# Patient Record
Sex: Male | Born: 1960 | Race: White | Hispanic: No | Marital: Married | Smoking: Former smoker
Health system: Southern US, Community
[De-identification: ages and names within clinical notes are randomized; demographics above are authoritative.]

## PROBLEM LIST (undated history)

## (undated) DIAGNOSIS — E669 Obesity, unspecified: Secondary | ICD-10-CM

## (undated) DIAGNOSIS — E785 Hyperlipidemia, unspecified: Secondary | ICD-10-CM

## (undated) DIAGNOSIS — I10 Essential (primary) hypertension: Secondary | ICD-10-CM

## (undated) DIAGNOSIS — C44319 Basal cell carcinoma of skin of other parts of face: Secondary | ICD-10-CM

## (undated) DIAGNOSIS — T7840XA Allergy, unspecified, initial encounter: Secondary | ICD-10-CM

## (undated) HISTORY — PX: PROSTATE SURGERY: SHX751

## (undated) HISTORY — DX: Allergy, unspecified, initial encounter: T78.40XA

## (undated) HISTORY — DX: Hyperlipidemia, unspecified: E78.5

## (undated) HISTORY — DX: Obesity, unspecified: E66.9

## (undated) HISTORY — DX: Basal cell carcinoma of skin of other parts of face: C44.319

## (undated) HISTORY — DX: Essential (primary) hypertension: I10

---

## 1998-11-23 ENCOUNTER — Encounter: Payer: Self-pay | Admitting: Urology

## 1998-11-23 ENCOUNTER — Ambulatory Visit (HOSPITAL_COMMUNITY): Admission: RE | Admit: 1998-11-23 | Discharge: 1998-11-23 | Payer: Self-pay | Admitting: Urology

## 2011-08-22 ENCOUNTER — Ambulatory Visit (INDEPENDENT_AMBULATORY_CARE_PROVIDER_SITE_OTHER): Payer: 59 | Admitting: Internal Medicine

## 2011-08-22 DIAGNOSIS — E119 Type 2 diabetes mellitus without complications: Secondary | ICD-10-CM | POA: Insufficient documentation

## 2011-08-22 MED ORDER — FLUTICASONE PROPIONATE 50 MCG/ACT NA SUSP: 2.0000 | Freq: Every day | NASAL | Status: DC

## 2011-08-22 MED ORDER — PREDNISONE 10 MG PO TABS: ORAL_TABLET | ORAL | Status: DC

## 2011-08-22 NOTE — Progress Notes (Signed)
  Subjective:    Patient ID: Kevin Barnes, male    DOB: 04/17/1961, 51 y.o.   MRN: [id number]  URI  This is a new problem. The current episode started in the past 7 days. The problem has been gradually worsening. There has been no fever. Associated symptoms include congestion, coughing and sinus pain. Pertinent negatives include no abdominal pain, chest pain, ear pain, headaches, neck pain, sore throat, vomiting or wheezing.      Review of Systems  Constitutional: Negative.   HENT: Positive for congestion and postnasal drip. Negative for hearing loss, ear pain, sore throat, neck pain and ear discharge.   Eyes: Negative.   Respiratory: Positive for cough. Negative for wheezing.   Cardiovascular: Negative.  Negative for chest pain.  Gastrointestinal: Negative.  Negative for vomiting and abdominal pain.  Musculoskeletal: Negative.   Neurological: Negative.  Negative for headaches.       Objective:   Physical Exam  Constitutional: He is oriented to person, place, and time. He appears well-developed and well-nourished.  HENT:  Head: Normocephalic.  Right Ear: External ear normal.  Left Ear: External ear normal.  Mouth/Throat: Oropharynx is clear and moist.  Eyes: Pupils are equal, round, and reactive to light.  Cardiovascular: Normal rate.   Pulmonary/Chest: Effort normal and breath sounds normal.  Musculoskeletal: Normal range of motion.  Neurological: He is alert and oriented to person, place, and time.  Skin: Skin is warm and dry.          Assessment & Plan:

## 2011-08-22 NOTE — Progress Notes (Deleted)
VCO. Patient lower abdominal area prepped with betadine and alcohol.  2.5 cc 1% lidocaine with epi injected and #11 blade used to open wound.  This 2.5 cm wound was firm with only a small central area of fluctuance and a wound culture was obtained and a small amount of white/red drainage expressed.  1/4 inch packing used and telfa, gauze and hypofix applied.  Pt tolerated well.  Wound instructions given.

## 2011-08-22 NOTE — Patient Instructions (Signed)
Patient is advised to use short course of prednisone and flonase for symptomatic relief, if signs of infection and sinus pain worsen is given script of Cefdinir 300 mg one po BID #20 to take for presumed sinusitis.  He wishes not to have any lab work today and plans to return in February to see Dr. Perrin Maltese for his diabetes and chronic med follow-up.  RTC if symptoms not improved in 2-3 days.

## 2011-08-24 ENCOUNTER — Other Ambulatory Visit: Payer: Self-pay | Admitting: Internal Medicine

## 2011-08-27 ENCOUNTER — Ambulatory Visit: Payer: Self-pay | Admitting: Internal Medicine

## 2011-09-10 ENCOUNTER — Telehealth: Payer: Self-pay

## 2011-09-10 NOTE — Telephone Encounter (Signed)
Patient has appointment w/ Dr. Perrin Maltese for 10/15/11 but his RX will run out before then. Requests we call in RX soon so he doesn't run out.

## 2011-09-11 NOTE — Telephone Encounter (Signed)
PT DOES NOT NEED RX'S NOW BUT WILL HAVE HIS PHARMACY SEND A REQUEST WHEN HE NEEDS IT.

## 2011-09-11 NOTE — Telephone Encounter (Signed)
Called pt LMOM for pt to call back and verify which rx he needs

## 2011-09-26 ENCOUNTER — Other Ambulatory Visit: Payer: Self-pay | Admitting: Physician Assistant

## 2011-09-28 ENCOUNTER — Other Ambulatory Visit: Payer: Self-pay | Admitting: Physician Assistant

## 2011-10-09 ENCOUNTER — Ambulatory Visit (INDEPENDENT_AMBULATORY_CARE_PROVIDER_SITE_OTHER): Payer: 59 | Admitting: Internal Medicine

## 2011-10-09 VITALS — BP 122/84 | HR 76 | Temp 97.7°F | Resp 16 | Ht 70.75 in | Wt 229.8 lb

## 2011-10-09 DIAGNOSIS — E7889 Other lipoprotein metabolism disorders: Secondary | ICD-10-CM

## 2011-10-09 DIAGNOSIS — Z7189 Other specified counseling: Secondary | ICD-10-CM

## 2011-10-09 DIAGNOSIS — E119 Type 2 diabetes mellitus without complications: Secondary | ICD-10-CM

## 2011-10-09 DIAGNOSIS — E789 Disorder of lipoprotein metabolism, unspecified: Secondary | ICD-10-CM

## 2011-10-09 DIAGNOSIS — I10 Essential (primary) hypertension: Secondary | ICD-10-CM

## 2011-10-09 DIAGNOSIS — Z79899 Other long term (current) drug therapy: Secondary | ICD-10-CM

## 2011-10-09 DIAGNOSIS — E1169 Type 2 diabetes mellitus with other specified complication: Secondary | ICD-10-CM | POA: Insufficient documentation

## 2011-10-09 LAB — COMPREHENSIVE METABOLIC PANEL
Alkaline Phosphatase: 85 U/L (ref 39–117)
BUN: 12 mg/dL (ref 6–23)
Creat: 0.92 mg/dL (ref 0.50–1.35)
Glucose, Bld: 142 mg/dL — ABNORMAL HIGH (ref 70–99)
Sodium: 137 mEq/L (ref 135–145)
Total Bilirubin: 0.6 mg/dL (ref 0.3–1.2)

## 2011-10-09 LAB — LIPID PANEL
Cholesterol: 166 mg/dL (ref 0–200)
HDL: 44 mg/dL (ref >39)
LDL Cholesterol: 67 mg/dL (ref 0–99)
Total CHOL/HDL Ratio: 3.8 Ratio
Triglycerides: 277 mg/dL — ABNORMAL HIGH (ref <150)
VLDL: 55 mg/dL — ABNORMAL HIGH (ref 0–40)

## 2011-10-09 MED ORDER — METFORMIN HCL 500 MG PO TABS: 500.0000 mg | ORAL_TABLET | Freq: Two times a day (BID) | ORAL | Status: DC

## 2011-10-09 MED ORDER — SIMVASTATIN 20 MG PO TABS: 20.0000 mg | ORAL_TABLET | Freq: Every evening | ORAL | Status: DC

## 2011-10-09 MED ORDER — LISINOPRIL-HYDROCHLOROTHIAZIDE 20-12.5 MG PO TABS: 1.0000 | ORAL_TABLET | Freq: Every day | ORAL | Status: DC

## 2011-10-09 NOTE — Progress Notes (Signed)
  Subjective:    Patient ID: Kevin Barnes, male    DOB: 1961-01-30, 51 y.o.   MRN: [id number]  HPI Niddm, Htn, Lipids all stable and no problems with meds.   Review of Systems Stable    Objective:   Physical Exam Lungs clear Heart Normal        Assessment & Plan:  Refill meds  Done 1 yr CPE 6 mos

## 2011-10-15 ENCOUNTER — Encounter: Payer: 59 | Admitting: Internal Medicine

## 2012-08-07 ENCOUNTER — Ambulatory Visit (INDEPENDENT_AMBULATORY_CARE_PROVIDER_SITE_OTHER): Payer: 59 | Admitting: Family Medicine

## 2012-08-07 VITALS — BP 120/90 | HR 81 | Temp 98.0°F | Resp 18 | Wt 239.0 lb

## 2012-08-07 DIAGNOSIS — G43909 Migraine, unspecified, not intractable, without status migrainosus: Secondary | ICD-10-CM

## 2012-08-07 DIAGNOSIS — R110 Nausea: Secondary | ICD-10-CM

## 2012-08-07 MED ORDER — CYCLOBENZAPRINE HCL 10 MG PO TABS: 10.0000 mg | ORAL_TABLET | Freq: Two times a day (BID) | ORAL | Status: DC | PRN

## 2012-08-07 MED ORDER — KETOROLAC TROMETHAMINE 60 MG/2ML IM SOLN
60.0000 mg | Freq: Once | INTRAMUSCULAR | Status: AC
  Administered 2012-08-07: 60 mg via INTRAMUSCULAR

## 2012-08-07 MED ORDER — BUTALBITAL-APAP-CAFFEINE 50-325-40 MG PO TABS: 1.0000 | ORAL_TABLET | Freq: Four times a day (QID) | ORAL | Status: DC | PRN

## 2012-08-07 MED ORDER — PROMETHAZINE HCL 25 MG/ML IJ SOLN
25.0000 mg | Freq: Four times a day (QID) | INTRAMUSCULAR | Status: DC | PRN
  Administered 2012-08-07: 25 mg via INTRAMUSCULAR

## 2012-08-07 NOTE — Progress Notes (Signed)
Subjective:    Patient ID: Kevin Barnes, male    DOB: [birth year], 52 y.o.   MRN: [id number] Chief Complaint  Patient presents with  . Headache    HPI  Kevin Barnes is a pleasant 52 yo man who has been under a lot of stress recently.  His son has mental illness and had been off of his medications so he feels certain that this is a tension HA - he can feel all the tenseness in his neck and that he is clenching his jaw and squeezing his eyes.  HA started 2d ago- started in back of neck, yesterday was worse and now pressure behind eyes through all head.  Took 1500mg  acetaminophen (over 2 hrs) yest then layed down and improved a little and focused mainly in back of neck.  Took excedrin migraine last night so didn't sleep well. This a.m. has progressively increased but knew he had reached his max dose on tylenol so came in.  Rarely gets HAs this bad - has had about 4 migraines through life.  Worse w/ light and sound. Was a little nauseas yesterday.  Drives himself here.  Nothing feels unusual about this HA - no dizziness, vision change, confusion, etc.  No falls or hits to the head.    Past Medical History  Diagnosis Date  . Obesity   . Diabetes mellitus   . Obesity   . Hyperlipidemia   . Hypertension    Current Outpatient Prescriptions on File Prior to Visit  Medication Sig Dispense Refill  . B Complex-C (B-COMPLEX WITH VITAMIN C) tablet Take 1 tablet by mouth daily.      . Cholecalciferol (VITAMIN D) 2000 UNITS CAPS Take 1 capsule by mouth daily.      . Flaxseed, Linseed, (FLAX SEED OIL) 1000 MG CAPS Take by mouth.      . fluticasone (FLONASE) 50 MCG/ACT nasal spray Place 2 sprays into the nose daily.  16 g  3  . lisinopril-hydrochlorothiazide (PRINZIDE,ZESTORETIC) 20-12.5 MG per tablet Take 1 tablet by mouth daily.  90 tablet  3  . metFORMIN (GLUCOPHAGE) 500 MG tablet Take 1 tablet (500 mg total) by mouth 2 (two) times daily with a meal.  180 tablet  3  . simvastatin (ZOCOR) 20 MG tablet Take  1 tablet (20 mg total) by mouth every evening.  90 tablet  3  . vitamin B-12 (CYANOCOBALAMIN) 100 MCG tablet Take 50 mcg by mouth daily.       No current facility-administered medications on file prior to visit.   No Known Allergies   Review of Systems  Constitutional: Positive for activity change, appetite change and fatigue. Negative for fever, chills and diaphoresis.  HENT: Positive for neck pain and neck stiffness. Negative for hearing loss, ear pain, congestion, rhinorrhea, trouble swallowing, sinus pressure, tinnitus and ear discharge.   Eyes: Positive for photophobia and pain. Negative for discharge, redness and visual disturbance.  Gastrointestinal: Positive for nausea. Negative for vomiting, abdominal pain and diarrhea.  Musculoskeletal: Positive for myalgias.  Skin: Negative for rash.  Neurological: Positive for headaches. Negative for dizziness, tremors, seizures, syncope, facial asymmetry, speech difficulty, weakness, light-headedness and numbness.  Hematological: Negative for adenopathy.  Psychiatric/Behavioral: Positive for sleep disturbance.      BP 120/90  Pulse 81  Temp 98 F (36.7 C) (Oral)  Resp 18  Wt 239 lb (108.41 kg) Objective:   Physical Exam  Constitutional: He is oriented to person, place, and time. He appears well-developed and well-nourished. No  distress.  HENT:  Head: Normocephalic and atraumatic.  Right Ear: Tympanic membrane, external ear and ear canal normal.  Left Ear: Tympanic membrane, external ear and ear canal normal.  Nose: Nose normal.  Mouth/Throat: Uvula is midline, oropharynx is clear and moist and mucous membranes are normal. No oropharyngeal exudate.  Eyes: Conjunctivae normal and EOM are normal. Pupils are equal, round, and reactive to light. Right eye exhibits no discharge. Left eye exhibits no discharge. No scleral icterus.  Neck: Normal range of motion. Neck supple. No thyromegaly present.  Cardiovascular: Normal rate, regular  rhythm and normal heart sounds.   Pulmonary/Chest: Effort normal and breath sounds normal. No respiratory distress.  Lymphadenopathy:    He has no cervical adenopathy.  Neurological: He is alert and oriented to person, place, and time. No cranial nerve deficit or sensory deficit. He exhibits normal muscle tone. Coordination and gait normal.  Skin: Skin is warm and dry. He is not diaphoretic.  Psychiatric: He has a normal mood and affect. His behavior is normal.      Assessment & Plan:  Tension headache - phenergan 25mg  IM x 1 and toradol 60mg  IM x 1 now.  Few rx for flexeril and fioricet sent to pharmacy.

## 2012-08-07 NOTE — Patient Instructions (Addendum)
Tension Headache  A tension headache is a feeling of pain, pressure, or aching often felt over the front and sides of the head. The pain can be dull or can feel tight (constricting). It is the most common type of headache. Tension headaches are not normally associated with nausea or vomiting and do not get worse with physical activity. Tension headaches can last 30 minutes to several days.   CAUSES   The exact cause is not known, but it may be caused by chemicals and hormones in the brain that lead to pain. Tension headaches often begin after stress, anxiety, or depression. Other triggers may include:   Alcohol.   Caffeine (too much or withdrawal).   Respiratory infections (colds, flu, sinus infections).   Dental problems or teeth clenching.   Fatigue.   Holding your head and neck in one position too long while using a computer.  SYMPTOMS    Pressure around the head.    Dull, aching head pain.    Pain felt over the front and sides of the head.    Tenderness in the muscles of the head, neck, and shoulders.  DIAGNOSIS   A tension headache is often diagnosed based on:    Symptoms.    Physical examination.    A CT scan or MRI of your head. These tests may be ordered if symptoms are severe or unusual.  TREATMENT   Medicines may be given to help relieve symptoms.   HOME CARE INSTRUCTIONS    Only take over-the-counter or prescription medicines for pain or discomfort as directed by your caregiver.    Lie down in a dark, quiet room when you have a headache.    Keep a journal to find out what may be triggering your headaches. For example, write down:   What you eat and drink.   How much sleep you get.   Any change to your diet or medicines.   Try massage or other relaxation techniques.    Ice packs or heat applied to the head and neck can be used. Use these 3 to 4 times per day for 15 to 20 minutes each time, or as needed.    Limit stress.    Sit up straight, and do not tense your muscles.     Quit smoking if you smoke.   Limit alcohol use.   Decrease the amount of caffeine you drink, or stop drinking caffeine.   Eat and exercise regularly.   Get 7 to 9 hours of sleep, or as recommended by your caregiver.   Avoid excessive use of pain medicine as recurrent headaches can occur.   SEEK MEDICAL CARE IF:    You have problems with the medicines you were prescribed.   Your medicines do not work.   You have a change from the usual headache.   You have nausea or vomiting.  SEEK IMMEDIATE MEDICAL CARE IF:    Your headache becomes severe.   You have a fever.   You have a stiff neck.   You have loss of vision.   You have muscular weakness or loss of muscle control.   You lose your balance or have trouble walking.   You feel faint or pass out.   You have severe symptoms that are different from your first symptoms.  MAKE SURE YOU:    Understand these instructions.   Will watch your condition.   Will get help right away if you are not doing well or get worse.  

## 2012-09-08 ENCOUNTER — Other Ambulatory Visit: Payer: Self-pay | Admitting: Family Medicine

## 2012-09-09 NOTE — Telephone Encounter (Signed)
I approved Kevin Barnes's refill on Fioricet - disp 12, no refills. Please call in.

## 2012-09-09 NOTE — Telephone Encounter (Signed)
Forward to Dr. Clelia Croft

## 2012-10-14 ENCOUNTER — Other Ambulatory Visit: Payer: Self-pay

## 2012-10-14 ENCOUNTER — Other Ambulatory Visit: Payer: Self-pay | Admitting: Internal Medicine

## 2012-10-14 MED ORDER — METFORMIN HCL 500 MG PO TABS: 500.0000 mg | ORAL_TABLET | Freq: Two times a day (BID) | ORAL | Status: DC

## 2012-11-03 ENCOUNTER — Ambulatory Visit (INDEPENDENT_AMBULATORY_CARE_PROVIDER_SITE_OTHER): Payer: 59 | Admitting: Family Medicine

## 2012-11-03 VITALS — BP 128/84 | HR 76 | Temp 98.0°F | Resp 16 | Ht 70.5 in | Wt 229.0 lb

## 2012-11-03 DIAGNOSIS — I10 Essential (primary) hypertension: Secondary | ICD-10-CM

## 2012-11-03 DIAGNOSIS — E119 Type 2 diabetes mellitus without complications: Secondary | ICD-10-CM

## 2012-11-03 DIAGNOSIS — Z0000 Encounter for general adult medical examination without abnormal findings: Secondary | ICD-10-CM

## 2012-11-03 DIAGNOSIS — Z7189 Other specified counseling: Secondary | ICD-10-CM

## 2012-11-03 DIAGNOSIS — E789 Disorder of lipoprotein metabolism, unspecified: Secondary | ICD-10-CM

## 2012-11-03 DIAGNOSIS — R51 Headache: Secondary | ICD-10-CM

## 2012-11-03 LAB — COMPREHENSIVE METABOLIC PANEL
ALT: 57 U/L — ABNORMAL HIGH (ref 0–53)
AST: 35 U/L (ref 0–37)
Albumin: 4.3 g/dL (ref 3.5–5.2)
Alkaline Phosphatase: 67 U/L (ref 39–117)
BUN: 11 mg/dL (ref 6–23)
CO2: 29 mEq/L (ref 19–32)
Calcium: 9.7 mg/dL (ref 8.4–10.5)
Chloride: 96 mEq/L (ref 96–112)
Creat: 0.89 mg/dL (ref 0.50–1.35)
Glucose, Bld: 127 mg/dL — ABNORMAL HIGH (ref 70–99)
Potassium: 4.3 mEq/L (ref 3.5–5.3)
Sodium: 138 mEq/L (ref 135–145)
Total Bilirubin: 0.6 mg/dL (ref 0.3–1.2)
Total Protein: 7.5 g/dL (ref 6.0–8.3)

## 2012-11-03 LAB — LIPID PANEL
Cholesterol: 154 mg/dL (ref 0–200)
HDL: 46 mg/dL (ref >39)
LDL Cholesterol: 48 mg/dL (ref 0–99)
Total CHOL/HDL Ratio: 3.3 Ratio
Triglycerides: 302 mg/dL — ABNORMAL HIGH (ref <150)
VLDL: 60 mg/dL — ABNORMAL HIGH (ref 0–40)

## 2012-11-03 LAB — PSA: PSA: 2.95 ng/mL (ref <=4.00)

## 2012-11-03 LAB — POCT GLYCOSYLATED HEMOGLOBIN (HGB A1C): Hemoglobin A1C: 6.5

## 2012-11-03 MED ORDER — SIMVASTATIN 20 MG PO TABS: 20.0000 mg | ORAL_TABLET | Freq: Every evening | ORAL | Status: DC

## 2012-11-03 MED ORDER — METFORMIN HCL 500 MG PO TABS: 500.0000 mg | ORAL_TABLET | Freq: Two times a day (BID) | ORAL | Status: DC

## 2012-11-03 MED ORDER — LISINOPRIL-HYDROCHLOROTHIAZIDE 20-12.5 MG PO TABS
1.0000 | ORAL_TABLET | Freq: Every day | ORAL | Status: DC
Start: 2012-11-03 — End: 2012-12-12

## 2012-11-03 MED ORDER — BUTALBITAL-APAP-CAFFEINE 50-325-40 MG PO TABS: 1.0000 | ORAL_TABLET | Freq: Every day | ORAL | Status: DC | PRN

## 2012-11-03 NOTE — Progress Notes (Signed)
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  He hasn't been able to get in with Dr. Perrin Maltese.  All meds are about to run out.  Currently patient is in his usual state of health. He says his blood sugars when checked randomly have been under control. He is stressed with his 68 year old stepson who still trying to find his way and his move back into the house. Patient has no new complaints.  Objective: Patient is in no acute distress, is overweight, good eye contact and appropriate HEENT is unremarkable Neck: Supple without adenopathy or thyromegaly Chest: Clear Heart: Regular, no murmur, gallop, or right Abdomen: Soft nontender without HSM or masses  Assessment: Patient due for physical and will schedule with Dr. Perrin Maltese. Labs and medications were reviewed and updated  HTN (hypertension) - Plan: lisinopril-hydrochlorothiazide (PRINZIDE,ZESTORETIC) 20-12.5 MG per tablet  Lipid disorder - Plan: simvastatin (ZOCOR) 20 MG tablet, lisinopril-hydrochlorothiazide (PRINZIDE,ZESTORETIC) 20-12.5 MG per tablet  Encounter for medication review and counseling - Plan: simvastatin (ZOCOR) 20 MG tablet  Diabetes mellitus - Plan: simvastatin (ZOCOR) 20 MG tablet, metFORMIN (GLUCOPHAGE) 500 MG tablet, POCT glycosylated hemoglobin (Hb A1C), Comprehensive metabolic panel, Lipid panel, Microalbumin, urine  Headache - Plan: butalbital-acetaminophen-caffeine (FIORICET, ESGIC) 50-325-40 MG per tablet  Annual physical exam - Plan: PSA, Ambulatory referral to Gastroenterology  Results for orders placed in visit on 11/03/12  POCT GLYCOSYLATED HEMOGLOBIN (HGB A1C)      Result Value Range   Hemoglobin A1C 6.5

## 2012-11-04 LAB — MICROALBUMIN, URINE: Microalb, Ur: 1.56 mg/dL (ref 0.00–1.89)

## 2012-12-12 ENCOUNTER — Other Ambulatory Visit: Payer: Self-pay | Admitting: Family Medicine

## 2013-01-19 ENCOUNTER — Other Ambulatory Visit: Payer: Self-pay | Admitting: Internal Medicine

## 2013-02-15 ENCOUNTER — Other Ambulatory Visit: Payer: Self-pay | Admitting: Physician Assistant

## 2013-05-17 ENCOUNTER — Other Ambulatory Visit: Payer: Self-pay | Admitting: Family Medicine

## 2013-05-29 ENCOUNTER — Other Ambulatory Visit: Payer: Self-pay | Admitting: Family Medicine

## 2013-06-17 ENCOUNTER — Other Ambulatory Visit: Payer: Self-pay | Admitting: Physician Assistant

## 2013-06-30 ENCOUNTER — Telehealth: Payer: Self-pay

## 2013-06-30 NOTE — Telephone Encounter (Signed)
Pt was seen in April and thought he rec'd refills for one yr. He needs refills on metformin 500 mg and lisinop/hctz. Pharmacy said that he needed an office visit. He made an appt for his cpe next month.   [facility] on [address]

## 2013-07-01 MED ORDER — LISINOPRIL-HYDROCHLOROTHIAZIDE 20-12.5 MG PO TABS: 1.0000 | ORAL_TABLET | Freq: Every day | ORAL | Status: DC

## 2013-07-01 MED ORDER — METFORMIN HCL 500 MG PO TABS: 500.0000 mg | ORAL_TABLET | Freq: Two times a day (BID) | ORAL | Status: DC

## 2013-07-27 ENCOUNTER — Encounter: Payer: Self-pay | Admitting: Physician Assistant

## 2013-07-27 ENCOUNTER — Ambulatory Visit (INDEPENDENT_AMBULATORY_CARE_PROVIDER_SITE_OTHER): Payer: 59 | Admitting: Physician Assistant

## 2013-07-27 VITALS — BP 132/90 | HR 73 | Temp 97.9°F | Resp 16 | Ht 70.5 in | Wt 233.4 lb

## 2013-07-27 DIAGNOSIS — E1059 Type 1 diabetes mellitus with other circulatory complications: Secondary | ICD-10-CM

## 2013-07-27 DIAGNOSIS — I10 Essential (primary) hypertension: Secondary | ICD-10-CM

## 2013-07-27 DIAGNOSIS — R51 Headache: Secondary | ICD-10-CM

## 2013-07-27 DIAGNOSIS — R319 Hematuria, unspecified: Secondary | ICD-10-CM

## 2013-07-27 DIAGNOSIS — Z0000 Encounter for general adult medical examination without abnormal findings: Secondary | ICD-10-CM

## 2013-07-27 DIAGNOSIS — E119 Type 2 diabetes mellitus without complications: Secondary | ICD-10-CM

## 2013-07-27 DIAGNOSIS — E785 Hyperlipidemia, unspecified: Secondary | ICD-10-CM

## 2013-07-27 LAB — COMPREHENSIVE METABOLIC PANEL
ALBUMIN: 4.4 g/dL (ref 3.5–5.2)
ALT: 50 U/L (ref 0–53)
AST: 33 U/L (ref 0–37)
Alkaline Phosphatase: 71 U/L (ref 39–117)
BUN: 10 mg/dL (ref 6–23)
CO2: 30 meq/L (ref 19–32)
Calcium: 9.5 mg/dL (ref 8.4–10.5)
Chloride: 97 mEq/L (ref 96–112)
Creat: 0.87 mg/dL (ref 0.50–1.35)
GLUCOSE: 115 mg/dL — AB (ref 70–99)
POTASSIUM: 3.8 meq/L (ref 3.5–5.3)
SODIUM: 135 meq/L (ref 135–145)
TOTAL PROTEIN: 7.5 g/dL (ref 6.0–8.3)
Total Bilirubin: 0.7 mg/dL (ref 0.3–1.2)

## 2013-07-27 LAB — CBC
HCT: 43.1 % (ref 39.0–52.0)
HEMOGLOBIN: 15.3 g/dL (ref 13.0–17.0)
MCH: 31.8 pg (ref 26.0–34.0)
MCHC: 35.5 g/dL (ref 30.0–36.0)
MCV: 89.6 fL (ref 78.0–100.0)
Platelets: 325 10*3/uL (ref 150–400)
RBC: 4.81 MIL/uL (ref 4.22–5.81)
RDW: 12.7 % (ref 11.5–15.5)
WBC: 9.5 10*3/uL (ref 4.0–10.5)

## 2013-07-27 LAB — IFOBT (OCCULT BLOOD): IMMUNOLOGICAL FECAL OCCULT BLOOD TEST: POSITIVE

## 2013-07-27 LAB — POCT UA - MICROSCOPIC ONLY
Casts, Ur, LPF, POC: NEGATIVE
Crystals, Ur, HPF, POC: NEGATIVE
MUCUS UA: NEGATIVE
YEAST UA: NEGATIVE

## 2013-07-27 LAB — GLUCOSE, POCT (MANUAL RESULT ENTRY): POC Glucose: 113 mg/dl — AB (ref 70–99)

## 2013-07-27 LAB — POCT URINALYSIS DIPSTICK
BILIRUBIN UA: NEGATIVE
GLUCOSE UA: NEGATIVE
Ketones, UA: NEGATIVE
LEUKOCYTES UA: NEGATIVE
NITRITE UA: NEGATIVE
Protein, UA: NEGATIVE
Spec Grav, UA: <=1.005
Urobilinogen, UA: 0.2
pH, UA: 5.5

## 2013-07-27 LAB — POCT GLYCOSYLATED HEMOGLOBIN (HGB A1C): Hemoglobin A1C: 7.2

## 2013-07-27 LAB — LIPID PANEL
CHOLESTEROL: 154 mg/dL (ref 0–200)
HDL: 52 mg/dL (ref >39)
LDL Cholesterol: 52 mg/dL (ref 0–99)
TRIGLYCERIDES: 251 mg/dL — AB (ref <150)
Total CHOL/HDL Ratio: 3 Ratio
VLDL: 50 mg/dL — AB (ref 0–40)

## 2013-07-27 MED ORDER — BUTALBITAL-APAP-CAFFEINE 50-325-40 MG PO TABS: 1.0000 | ORAL_TABLET | Freq: Every day | ORAL | Status: DC | PRN

## 2013-07-27 MED ORDER — METFORMIN HCL 1000 MG PO TABS: 1000.0000 mg | ORAL_TABLET | Freq: Two times a day (BID) | ORAL | Status: DC

## 2013-07-27 MED ORDER — GLIPIZIDE 5 MG PO TABS: 5.0000 mg | ORAL_TABLET | Freq: Two times a day (BID) | ORAL | Status: DC

## 2013-07-27 MED ORDER — LISINOPRIL-HYDROCHLOROTHIAZIDE 20-25 MG PO TABS: 1.0000 | ORAL_TABLET | Freq: Every day | ORAL | Status: DC

## 2013-07-27 NOTE — Progress Notes (Signed)
   Subjective:    Patient ID: Kevin Barnes, male    DOB: [birth year], 53 y.o.   MRN: [id number]  HPI    Review of Systems  Constitutional: Negative.   HENT: Negative.   Eyes: Negative.   Respiratory: Negative.   Cardiovascular: Negative.   Gastrointestinal: Negative.   Endocrine: Negative.   Genitourinary: Negative.   Musculoskeletal: Negative.   Skin: Negative.   Allergic/Immunologic: Negative.   Neurological: Negative.   Hematological: Negative.   Psychiatric/Behavioral: Negative.        Objective:   Physical Exam        Assessment & Plan:

## 2013-07-27 NOTE — Progress Notes (Signed)
Patient ID: Kevin Barnes MRN: [id number], DOB: [birth year] 53 y.o. Date of Encounter: 07/27/2013, 3:47 PM  Primary Physician: Kennon Portela, MD  Chief Complaint: Physical (CPE)  HPI: 53 y.o. male with history noted below here for CPE. Doing well. Last physical was 11/03/12.   1) Diabetes mellitus: Currently on metformin 500 mg bid and tolerating without issues. Eats what he wants to, just uses portion control. Plans to start exercising again. Last A1C was 6.5% on 11/03/12.   2) Hypertension: Currently on lisinopril/HCTZ 20/12.5 mg and tolerating without any issues. Generally eats what he wants, just uses portion control. Not currently exercising, but plans to get back into this. No chest pain, headaches, vision changes, or focal deficits.   3) Hyperlipidemia: Has always been told be could eat whatever he wanted, just use portion control. However, with all of the stresses of the holidays and his family being in the hospital he has been eating more fast food lately. Currently on simvastain 20 mg qhs and tolerating without any issues. He was exercising regularly and plans to restart this.   4) Migraines: Has been under a lot of stress lately with 2 family members in 2 different hospitals. Has had 3 migraines within the past month. Usually does not have that many. He requests a refill of his Fiorcet. This works well for him.   5) CPE: Up to date with colonoscopy. No polyps found. Next due in 10 years.    Review of Systems: Consitutional: No fever, chills, fatigue, night sweats, lymphadenopathy, or weight changes. Eyes: No visual changes, eye redness, or discharge. ENT/Mouth: Ears: No otalgia, tinnitus, hearing loss, discharge. Nose: No congestion, rhinorrhea, sinus pain, or epistaxis. Throat: No sore throat, post nasal drip, or teeth pain. Cardiovascular: No CP, palpitations, diaphoresis, DOE, edema, orthopnea, PND. Respiratory: No cough, hemoptysis, SOB, or wheezing. Gastrointestinal:  No anorexia, dysphagia, reflux, pain, nausea, vomiting, hematemesis, diarrhea, constipation, BRBPR, or melena. Genitourinary: No dysuria, frequency, urgency, hematuria, incontinence, nocturia, decreased urinary stream, discharge, impotence, or testicular pain/masses. Musculoskeletal: No decreased ROM, myalgias, stiffness, joint swelling, or weakness. Skin: No rash, erythema, lesion changes, pain, warmth, jaundice, or pruritis. Neurological: No headache, dizziness, syncope, seizures, tremors, memory loss, coordination problems, or paresthesias. Psychological: No anxiety, depression, hallucinations, SI/HI. Endocrine: No fatigue, polydipsia, polyphagia, polyuria, or known diabetes.   Past Medical History  Diagnosis Date  . Obesity   . Diabetes mellitus   . Obesity   . Hyperlipidemia   . Hypertension   . Allergy      Past Surgical History  Procedure Laterality Date  . Prostate surgery      Home Meds:  Prior to Admission medications   Medication Sig Start Date End Date Taking? Authorizing Provider  B Complex-C (B-COMPLEX WITH VITAMIN C) tablet Take 1 tablet by mouth daily.    Historical Provider, MD  butalbital-acetaminophen-caffeine (FIORICET, ESGIC) [phone] MG per tablet Take 1 tablet by mouth daily as needed for headache. 11/03/12   Robyn Haber, MD  Cholecalciferol (VITAMIN D) 2000 UNITS CAPS Take 1 capsule by mouth daily.    Historical Provider, MD  cyclobenzaprine (FLEXERIL) 10 MG tablet Take 1 tablet (10 mg total) by mouth 2 (two) times daily as needed for muscle spasms. 08/07/12   Shawnee Knapp, MD  Flaxseed, Linseed, (FLAX SEED OIL) 1000 MG CAPS Take by mouth.    Historical Provider, MD  fluticasone (FLONASE) 50 MCG/ACT nasal spray Place 2 sprays into the nose daily. 08/22/11 08/21/12  Kemper Durie, PA-C  lisinopril-hydrochlorothiazide (PRINZIDE,ZESTORETIC) 20-12.5 MG per tablet Take 1 tablet by mouth daily. PATIENT NEEDS OFFICE VISIT FOR ADDITIONAL REFILLS - 3rd NOTICE 07/01/13    Chelle S Jeffery, PA-C  metFORMIN (GLUCOPHAGE) 500 MG tablet Take 1 tablet (500 mg total) by mouth 2 (two) times daily with a meal. PATIENT NEEDS OFFICE VISIT FOR ADDITIONAL REFILLS 07/01/13   Chelle S Jeffery, PA-C  simvastatin (ZOCOR) 20 MG tablet Take 1 tablet (20 mg total) by mouth every evening. 11/03/12   Robyn Haber, MD  VIAGRA 50 MG tablet TAKE ONE TABLET BY MOUTH EVERY DAY AS NEEDED 01/19/13   Theda Sers, PA-C  vitamin B-12 (CYANOCOBALAMIN) 100 MCG tablet Take 50 mcg by mouth daily.    Historical Provider, MD    Allergies: No Known Allergies  History   Social History  . Marital Status: Married    Spouse Name: N/A    Number of Children: N/A  . Years of Education: 12   Occupational History  . dispatcher [employer]   Social History Main Topics  . Smoking status: Former Research scientist (life sciences)  . Smokeless tobacco: Not on file  . Alcohol Use: 0.5 oz/week    1 drink(s) per week     Comment: 1 drink  . Drug Use: No  . Sexual Activity: Yes    Birth Control/ Protection: None   Other Topics Concern  . Not on file   Social History Narrative  . No narrative on file    Family History  Problem Relation Age of Onset  . Cancer Father     Prostate  . Heart disease Father   . Hyperlipidemia Father   . Hypertension Father     Physical Exam: Blood pressure 132/90, pulse 73, temperature 97.9 F (36.6 C), temperature source Oral, resp. rate 16, height 5' 10.5" (1.791 m), weight 233 lb 6.4 oz (105.87 kg), SpO2 95.00%.  General: Well developed, well nourished, in no acute distress. HEENT: Normocephalic, atraumatic. Conjunctiva pink, sclera non-icteric. Pupils 2 mm constricting to 1 mm, round, regular, and equally reactive to light and accomodation. EOMI. Internal auditory canal clear. TMs with good cone of light and without pathology. Nasal mucosa pink. Nares are without discharge. No sinus tenderness. Oral mucosa pink. Dentition normal. Pharynx without exudate. Uvula midline.  Neck:  Supple. Trachea midline. No thyromegaly. Full ROM. No lymphadenopathy. Lungs: Clear to auscultation bilaterally without wheezes, rales, or rhonchi. Breathing is of normal effort and unlabored. Cardiovascular: RRR with S1 S2. No murmurs, rubs, or gallops appreciated. Distal pulses 2+ symmetrically. No carotid or abdominal bruits. Abdomen: Soft, non-tender, non-distended with normoactive bowel sounds. No hepatosplenomegaly or masses. No rebound/guarding. No CVA tenderness. Without hernias.  Rectal: No external hemorrhoids or fissures. Rectal vault without masses. Prostate not enlarged, smooth, symmetrical, without nodules, or TTP.  Genitourinary: Circumcised male. No penile lesions. Testes descended bilaterally, and smooth without tenderness or masses.  Musculoskeletal: Full range of motion and 5/5 strength throughout. Without swelling, atrophy, tenderness, crepitus, or warmth. Extremities without clubbing, cyanosis, or edema. Calves supple. Skin: Warm and moist without erythema, ecchymosis, wounds, or rash. Neuro: A+Ox3. CN II-XII grossly intact. Moves all extremities spontaneously. Full sensation throughout. Normal gait. DTR 2+ throughout upper and lower extremities. Finger to nose intact. Psych:  Responds to questions appropriately with a normal affect.   Studies:  Results for orders placed in visit on 07/27/13  POCT UA - MICROSCOPIC ONLY      Result Value Range   WBC, Ur, HPF, POC 0-2  RBC, urine, microscopic 2-4     Bacteria, U Microscopic trace     Mucus, UA neg     Epithelial cells, urine per micros 0-1     Crystals, Ur, HPF, POC neg     Casts, Ur, LPF, POC neg     Yeast, UA neg    POCT URINALYSIS DIPSTICK      Result Value Range   Color, UA yellow     Clarity, UA clear     Glucose, UA neg     Bilirubin, UA neg     Ketones, UA neg     Spec Grav, UA <=1.005     Blood, UA trace     pH, UA 5.5     Protein, UA neg\     Urobilinogen, UA 0.2     Nitrite, UA neg     Leukocytes,  UA Negative    GLUCOSE, POCT (MANUAL RESULT ENTRY)      Result Value Range   POC Glucose 113 (*) 70 - 99 mg/dl  POCT GLYCOSYLATED HEMOGLOBIN (HGB A1C)      Result Value Range   Hemoglobin A1C 7.2    IFOBT (OCCULT BLOOD)      Result Value Range   IFOBT Positive       CBC, CMET, Lipid, PSA, TSH all pending. Patient is fasting.   Assessment/Plan:  53 y.o. male here for CPE with diabetes mellitus, hypertension, hyperlipidemia, migraines, microscopic hematuria, and positive hemosure   1) Diabetes mellitus -Poorly controlled -Increase metformin to 1000 mg 1 po bid #60 RF 2 -Add Glipizide 5 mg 1 po bid #60 RF 2 -Healthy diet and exercise -Weight loss -Follow up 3 months  2) Hypertension -Poorly controlled -Increase lisinopril/HCTZ to 20/25 mg 1 po daily #30 RF 2 -Healthy diet and exercise -Weight loss  3) Hyperlipidemia -Await labs -Currently on simvastatin 20 mg  -Await lipid panel to decide further statin management  -Healthy diet and exercise -Weight loss  4) Migraines -Requested refill of Fiorcet 1 po daily prn migraine #30 no RF -May call for 5 more months  5) Microscopic hematuria -Patient with 2-4 RBC's per HPF -Former smoker -Recommend Urology consult for further evaluation, however the patient left before I was able to review his labs  6) CPE -Colonoscopy up to date, June 2013, follow up 10 years -Positive Hemosure, will have patient follow up in 4 weeks to repeat. Patient left before I was able to review  -Healthy diet and exercise -Weight loss -Age appropriate anticipatory guidance    Signed, Christell Faith, PA-C [facility], [place] [address] [phone] 07/27/2013 3:47 PM

## 2013-07-28 LAB — PSA: PSA: 4.19 ng/mL — ABNORMAL HIGH (ref <=4.00)

## 2013-07-28 LAB — TSH: TSH: 0.654 u[IU]/mL (ref 0.350–4.500)

## 2013-10-19 ENCOUNTER — Encounter: Payer: Self-pay | Admitting: Physician Assistant

## 2013-10-19 ENCOUNTER — Ambulatory Visit (INDEPENDENT_AMBULATORY_CARE_PROVIDER_SITE_OTHER): Payer: 59 | Admitting: Physician Assistant

## 2013-10-19 VITALS — BP 127/74 | HR 90 | Temp 97.9°F | Resp 16 | Ht 70.5 in | Wt 230.2 lb

## 2013-10-19 DIAGNOSIS — I10 Essential (primary) hypertension: Secondary | ICD-10-CM

## 2013-10-19 DIAGNOSIS — Z7189 Other specified counseling: Secondary | ICD-10-CM

## 2013-10-19 DIAGNOSIS — E119 Type 2 diabetes mellitus without complications: Secondary | ICD-10-CM

## 2013-10-19 DIAGNOSIS — E789 Disorder of lipoprotein metabolism, unspecified: Secondary | ICD-10-CM

## 2013-10-19 LAB — COMPREHENSIVE METABOLIC PANEL
ALBUMIN: 4.4 g/dL (ref 3.5–5.2)
ALT: 40 U/L (ref 0–53)
AST: 24 U/L (ref 0–37)
Alkaline Phosphatase: 59 U/L (ref 39–117)
BUN: 13 mg/dL (ref 6–23)
CALCIUM: 10 mg/dL (ref 8.4–10.5)
CO2: 31 mEq/L (ref 19–32)
Chloride: 98 mEq/L (ref 96–112)
Creat: 0.92 mg/dL (ref 0.50–1.35)
Glucose, Bld: 78 mg/dL (ref 70–99)
POTASSIUM: 4.5 meq/L (ref 3.5–5.3)
SODIUM: 137 meq/L (ref 135–145)
Total Bilirubin: 0.5 mg/dL (ref 0.2–1.2)
Total Protein: 7.5 g/dL (ref 6.0–8.3)

## 2013-10-19 LAB — POCT GLYCOSYLATED HEMOGLOBIN (HGB A1C): Hemoglobin A1C: 6.3

## 2013-10-19 LAB — GLUCOSE, POCT (MANUAL RESULT ENTRY): POC GLUCOSE: 64 mg/dL — AB (ref 70–99)

## 2013-10-19 MED ORDER — GLIPIZIDE 5 MG PO TABS: 5.0000 mg | ORAL_TABLET | Freq: Two times a day (BID) | ORAL | Status: DC

## 2013-10-19 MED ORDER — LISINOPRIL-HYDROCHLOROTHIAZIDE 20-25 MG PO TABS: 1.0000 | ORAL_TABLET | Freq: Every day | ORAL | Status: DC

## 2013-10-19 MED ORDER — METFORMIN HCL 1000 MG PO TABS: 1000.0000 mg | ORAL_TABLET | Freq: Two times a day (BID) | ORAL | Status: DC

## 2013-10-19 MED ORDER — SIMVASTATIN 20 MG PO TABS: 20.0000 mg | ORAL_TABLET | Freq: Every evening | ORAL | Status: DC

## 2013-10-19 NOTE — Progress Notes (Signed)
Subjective:    Patient ID: Kevin Barnes, male    DOB: 1960/11/29, 53 y.o.   MRN: [id number]  HPI Primary Physician: Kennon Portela, MD  Chief Complaint:Medication refill  HPI: 53 y.o. male with history below presents for follow up of his diabetes and medication refill. Currently on metformin 1000 mg bid, glipizide 5 mg bid, lisinopril/HCTZ 20/25 mg daily, and simvastatin 20 mg qhs. Tolerating all medications without issues. Working on trying to eat a healthier diet. He would like to get back to a weight of 215 by October 2015 when he goes on his cruise. No chest pain, headaches, vision changes, or focal deficits. Last A1C 7.2 in January 2015.   He was seen by [facility] in January 2015. Plan to trend PSA. Has follow up scheduled June 2015. He does not want to "get all panicky" with any elevation.       Past Medical History  Diagnosis Date  . Obesity   . Diabetes mellitus   . Obesity   . Hyperlipidemia   . Hypertension   . Allergy      Home Meds: Prior to Admission medications   Medication Sig Start Date End Date Taking? Authorizing Provider  B Complex-C (B-COMPLEX WITH VITAMIN C) tablet Take 1 tablet by mouth daily.    Historical Provider, MD  butalbital-acetaminophen-caffeine (FIORICET, ESGIC) [phone] MG per tablet Take 1 tablet by mouth daily as needed for headache. 07/27/13   Rise Mu, PA-C  Cholecalciferol (VITAMIN D) 2000 UNITS CAPS Take 1 capsule by mouth daily.    Historical Provider, MD  cyclobenzaprine (FLEXERIL) 10 MG tablet Take 1 tablet (10 mg total) by mouth 2 (two) times daily as needed for muscle spasms. 08/07/12   Shawnee Knapp, MD  Flaxseed, Linseed, (FLAX SEED OIL) 1000 MG CAPS Take by mouth.    Historical Provider, MD  fluticasone (FLONASE) 50 MCG/ACT nasal spray Place 2 sprays into the nose daily. 08/22/11 08/21/12  Kemper Durie, PA-C  glipiZIDE (GLUCOTROL) 5 MG tablet Take 1 tablet (5 mg total) by mouth 2 (two) times daily before a meal. 07/27/13    Rise Mu, PA-C  lisinopril-hydrochlorothiazide (PRINZIDE,ZESTORETIC) 20-25 MG per tablet Take 1 tablet by mouth daily. 07/27/13   Rise Mu, PA-C  metFORMIN (GLUCOPHAGE) 1000 MG tablet Take 1 tablet (1,000 mg total) by mouth 2 (two) times daily with a meal. 07/27/13   Rise Mu, PA-C  simvastatin (ZOCOR) 20 MG tablet Take 1 tablet (20 mg total) by mouth every evening. 11/03/12   Robyn Haber, MD  VIAGRA 50 MG tablet TAKE ONE TABLET BY MOUTH EVERY DAY AS NEEDED 01/19/13   Theda Sers, PA-C  vitamin B-12 (CYANOCOBALAMIN) 100 MCG tablet Take 50 mcg by mouth daily.    Historical Provider, MD    Allergies: No Known Allergies  History   Social History  . Marital Status: Married    Spouse Name: N/A    Number of Children: N/A  . Years of Education: 12   Occupational History  . dispatcher [employer]   Social History Main Topics  . Smoking status: Former Research scientist (life sciences)  . Smokeless tobacco: Not on file  . Alcohol Use: 0.5 oz/week    1 drink(s) per week     Comment: 1 drink  . Drug Use: No  . Sexual Activity: Yes    Birth Control/ Protection: None   Other Topics Concern  . Not on file   Social History Narrative  .  No narrative on file     Review of Systems  Constitutional: Negative for fever, chills and fatigue.  Eyes: Negative for visual disturbance.  Respiratory: Negative for cough.   Cardiovascular: Negative for chest pain.  Endocrine: Negative for polydipsia, polyphagia and polyuria.  Musculoskeletal: Negative for myalgias.  Neurological: Negative for dizziness and headaches.  Psychiatric/Behavioral: Negative for hallucinations, confusion and decreased concentration. The patient is not nervous/anxious and is not hyperactive.        Objective:   Physical Exam  Physical Exam: Blood pressure 127/74, pulse 90, temperature 97.9 F (36.6 C), temperature source Oral, resp. rate 16, height 5' 10.5" (1.791 m), weight 230 lb 3.2 oz (104.418 kg), SpO2 95.00%., Body mass index  is 32.55 kg/(m^2). General: Well developed, well nourished, in no acute distress. Head: Normocephalic, atraumatic, eyes without discharge, sclera non-icteric, nares are without discharge. Bilateral auditory canals clear, TM's are without perforation, pearly grey and translucent with reflective cone of light bilaterally. Oral cavity moist, posterior pharynx without exudate, erythema, peritonsillar abscess, or post nasal drip. Uvula midline.   Neck: Supple. No thyromegaly. Full ROM. No lymphadenopathy. No carotid bruits.  Lungs: Clear bilaterally to auscultation without wheezes, rales, or rhonchi. Breathing is unlabored. Heart: RRR with S1 S2. No murmurs, rubs, or gallops appreciated. Msk:  Strength and tone normal for age. Extremities/Skin: Warm and dry. No clubbing or cyanosis. No edema. No rashes or suspicious lesions. No wounds on the lower extremities.  Neuro: Alert and oriented X 3. Moves all extremities spontaneously. Gait is normal. CNII-XII grossly in tact. Unremarkable monofilament exam bilaterally.  Psych:  Responds to questions appropriately with a normal affect.   Labs: Results for orders placed in visit on 10/19/13  GLUCOSE, POCT (MANUAL RESULT ENTRY)      Result Value Ref Range   POC Glucose 64 (*) 70 - 99 mg/dl  POCT GLYCOSYLATED HEMOGLOBIN (HGB A1C)      Result Value Ref Range   Hemoglobin A1C 6.3     CMP pending    Assessment & Plan:  53 year old male with diabetes mellitus  1) History of positive Hemosure -Will obtain records from prior colonoscopy -Patient reports his colonoscopy was in 2014 and he was told not to follow up for 10 years  2) Diabetes mellitus -Well controlled -Continue current medications -Metformin 1000 mg bid #180 RF 1 -Glipizide 5 mg bid #180 RF 1  3) Hypertension -Well controlled -Lisinopril/HCTZ 20/25 mg daily #90 RF 1 -Healthy diet and exercise -Weight loss  4) Hyperlipidemia -Simvastatin 20 mg qhs #90 RF 1 -Healthy diet and  exercise -Weight loss   Christell Faith, MHS, PA-C [facility] [place], [place] [address] [place] 10/19/2013 8:22 PM

## 2013-12-02 ENCOUNTER — Other Ambulatory Visit: Payer: Self-pay | Admitting: Physician Assistant

## 2013-12-03 NOTE — Telephone Encounter (Signed)
Kevin Barnes, you saw pt in March for med refills but don't see that this med was discussed. Do you want to RF or RTC?

## 2014-01-08 LAB — HM COLONOSCOPY: HM Colonoscopy: NORMAL

## 2014-04-23 ENCOUNTER — Telehealth: Payer: Self-pay

## 2014-04-23 DIAGNOSIS — E119 Type 2 diabetes mellitus without complications: Secondary | ICD-10-CM

## 2014-04-23 DIAGNOSIS — I10 Essential (primary) hypertension: Secondary | ICD-10-CM

## 2014-04-23 NOTE — Telephone Encounter (Signed)
Medication needs refilled metformin, and lisinopril. [place]. Please call patient when complete. Thank you! He says [facility] received no response from our office in a week since electronic request.  [place].  [phone]

## 2014-04-26 MED ORDER — LISINOPRIL-HYDROCHLOROTHIAZIDE 20-25 MG PO TABS: 1.0000 | ORAL_TABLET | Freq: Every day | ORAL | Status: DC

## 2014-04-26 MED ORDER — METFORMIN HCL 1000 MG PO TABS: 1000.0000 mg | ORAL_TABLET | Freq: Two times a day (BID) | ORAL | Status: DC

## 2014-04-26 NOTE — Telephone Encounter (Signed)
We have gotten no electronic reqs from pharmacy. I will give pt 1 mos RF of each, but needs ov then. Notified pt of all of the aforementioned and discussed that Thurmond Butts has left and we will be glad to set him up w/a new provider. Pt agreed to CB and sch appt.

## 2014-05-31 ENCOUNTER — Ambulatory Visit (INDEPENDENT_AMBULATORY_CARE_PROVIDER_SITE_OTHER): Payer: 59 | Admitting: Emergency Medicine

## 2014-05-31 VITALS — BP 132/94 | HR 75 | Temp 97.7°F | Resp 16 | Ht 71.25 in | Wt 233.6 lb

## 2014-05-31 DIAGNOSIS — E119 Type 2 diabetes mellitus without complications: Secondary | ICD-10-CM

## 2014-05-31 DIAGNOSIS — R972 Elevated prostate specific antigen [PSA]: Secondary | ICD-10-CM

## 2014-05-31 DIAGNOSIS — I10 Essential (primary) hypertension: Secondary | ICD-10-CM

## 2014-05-31 DIAGNOSIS — E789 Disorder of lipoprotein metabolism, unspecified: Secondary | ICD-10-CM

## 2014-05-31 DIAGNOSIS — Z7189 Other specified counseling: Secondary | ICD-10-CM

## 2014-05-31 LAB — POCT CBC
GRANULOCYTE PERCENT: 58.8 % (ref 37–80)
HEMATOCRIT: 45.6 % (ref 43.5–53.7)
Hemoglobin: 15.4 g/dL (ref 14.1–18.1)
Lymph, poc: 3.1 (ref 0.6–3.4)
MCH, POC: 31.1 pg (ref 27–31.2)
MCHC: 33.6 g/dL (ref 31.8–35.4)
MCV: 92.5 fL (ref 80–97)
MID (CBC): 0.7 (ref 0–0.9)
MPV: 7.1 fL (ref 0–99.8)
PLATELET COUNT, POC: 336 10*3/uL (ref 142–424)
POC Granulocyte: 5.4 (ref 2–6.9)
POC LYMPH PERCENT: 34 %L (ref 10–50)
POC MID %: 7.2 %M (ref 0–12)
RBC: 4.93 M/uL (ref 4.69–6.13)
RDW, POC: 12.2 %
WBC: 9.1 10*3/uL (ref 4.6–10.2)

## 2014-05-31 LAB — COMPREHENSIVE METABOLIC PANEL
ALT: 50 U/L (ref 0–53)
AST: 27 U/L (ref 0–37)
Albumin: 4.3 g/dL (ref 3.5–5.2)
Alkaline Phosphatase: 69 U/L (ref 39–117)
BUN: 11 mg/dL (ref 6–23)
CALCIUM: 9.9 mg/dL (ref 8.4–10.5)
CHLORIDE: 97 meq/L (ref 96–112)
CO2: 30 mEq/L (ref 19–32)
CREATININE: 0.94 mg/dL (ref 0.50–1.35)
Glucose, Bld: 123 mg/dL — ABNORMAL HIGH (ref 70–99)
Potassium: 4.1 mEq/L (ref 3.5–5.3)
SODIUM: 138 meq/L (ref 135–145)
TOTAL PROTEIN: 7.3 g/dL (ref 6.0–8.3)
Total Bilirubin: 0.4 mg/dL (ref 0.2–1.2)

## 2014-05-31 LAB — POCT URINALYSIS DIPSTICK
Bilirubin, UA: NEGATIVE
Blood, UA: NEGATIVE
Glucose, UA: NEGATIVE
Ketones, UA: NEGATIVE
LEUKOCYTES UA: NEGATIVE
NITRITE UA: NEGATIVE
Protein, UA: 30
Spec Grav, UA: 1.025
Urobilinogen, UA: 0.2
pH, UA: 6.5

## 2014-05-31 LAB — LIPID PANEL
CHOL/HDL RATIO: 3 ratio
Cholesterol: 152 mg/dL (ref 0–200)
HDL: 51 mg/dL (ref >39)
LDL Cholesterol: 58 mg/dL (ref 0–99)
Triglycerides: 215 mg/dL — ABNORMAL HIGH (ref <150)
VLDL: 43 mg/dL — ABNORMAL HIGH (ref 0–40)

## 2014-05-31 LAB — POCT UA - MICROSCOPIC ONLY
Bacteria, U Microscopic: NEGATIVE
Casts, Ur, LPF, POC: NEGATIVE
Crystals, Ur, HPF, POC: NEGATIVE
Mucus, UA: NEGATIVE
RBC, urine, microscopic: NEGATIVE
WBC, Ur, HPF, POC: NEGATIVE
YEAST UA: NEGATIVE

## 2014-05-31 LAB — POCT GLYCOSYLATED HEMOGLOBIN (HGB A1C): Hemoglobin A1C: 6.8

## 2014-05-31 MED ORDER — SILDENAFIL CITRATE 50 MG PO TABS: 100.0000 mg | ORAL_TABLET | ORAL | Status: DC | PRN

## 2014-05-31 MED ORDER — METFORMIN HCL 1000 MG PO TABS: 1000.0000 mg | ORAL_TABLET | Freq: Two times a day (BID) | ORAL | Status: DC

## 2014-05-31 MED ORDER — LISINOPRIL-HYDROCHLOROTHIAZIDE 20-25 MG PO TABS: 1.0000 | ORAL_TABLET | Freq: Every day | ORAL | Status: DC

## 2014-05-31 MED ORDER — BUTALBITAL-APAP-CAFFEINE 50-325-40 MG PO TABS: 1.0000 | ORAL_TABLET | Freq: Two times a day (BID) | ORAL | Status: DC | PRN

## 2014-05-31 MED ORDER — SIMVASTATIN 20 MG PO TABS: 20.0000 mg | ORAL_TABLET | Freq: Every evening | ORAL | Status: DC

## 2014-05-31 NOTE — Patient Instructions (Signed)

## 2014-05-31 NOTE — Progress Notes (Signed)
[facility] [address] [place] [address] [phone]  Date:  05/31/2014   Name:  Kevin Barnes   DOB:  Dec 10, 1960   MRN:  [id number]  PCP:  Kennon Portela, MD    Chief Complaint: Medication Refill   History of Present Illness:  Kevin Barnes is a 53 y.o. very pleasant male patient who presents with the following:  Needs refills on meds.  Has not run out. Non smoker   Current colonoscopy Compliant with meds Denies other complaint or health concern today.   Patient Active Problem List   Diagnosis Date Noted  . HTN (hypertension) 10/09/2011  . Lipids abnormal 10/09/2011  . Diabetes mellitus 08/22/2011    Past Medical History  Diagnosis Date  . Obesity   . Diabetes mellitus   . Obesity   . Hyperlipidemia   . Hypertension   . Allergy     Past Surgical History  Procedure Laterality Date  . Prostate surgery      History  Substance Use Topics  . Smoking status: Former Research scientist (life sciences)  . Smokeless tobacco: Not on file  . Alcohol Use: 0.5 oz/week    1 drink(s) per week     Comment: 1 drink    Family History  Problem Relation Age of Onset  . Cancer Father     Prostate  . Heart disease Father   . Hyperlipidemia Father   . Hypertension Father     No Known Allergies  Medication list has been reviewed and updated.  Current Outpatient Prescriptions on File Prior to Visit  Medication Sig Dispense Refill  . B Complex-C (B-COMPLEX WITH VITAMIN C) tablet Take 1 tablet by mouth daily.    . butalbital-acetaminophen-caffeine (FIORICET, ESGIC) 50-325-40 MG per tablet Take 1 tablet by mouth daily as needed for headache. 30 tablet 0  . Cholecalciferol (VITAMIN D) 2000 UNITS CAPS Take 1 capsule by mouth daily.    . Flaxseed, Linseed, (FLAX SEED OIL) 1000 MG CAPS Take by mouth.    Marland Kitchen lisinopril-hydrochlorothiazide (PRINZIDE,ZESTORETIC) 20-25 MG per tablet Take 1 tablet by mouth daily. PATIENT NEEDS OFFICE VISIT FOR ADDITIONAL REFILLS 30 tablet  0  . metFORMIN (GLUCOPHAGE) 1000 MG tablet Take 1 tablet (1,000 mg total) by mouth 2 (two) times daily with a meal. PATIENT NEEDS OFFICE VISIT FOR ADDITIONAL REFILLS 60 tablet 0  . simvastatin (ZOCOR) 20 MG tablet Take 1 tablet (20 mg total) by mouth every evening. 90 tablet 1  . VIAGRA 50 MG tablet TAKE ONE TABLET BY MOUTH ONCE DAILY AS NEEDED 6 tablet 4  . vitamin B-12 (CYANOCOBALAMIN) 100 MCG tablet Take 50 mcg by mouth daily.    . cyclobenzaprine (FLEXERIL) 10 MG tablet Take 1 tablet (10 mg total) by mouth 2 (two) times daily as needed for muscle spasms. 10 tablet 0  . fluticasone (FLONASE) 50 MCG/ACT nasal spray Place 2 sprays into the nose daily. 16 g 3  . glipiZIDE (GLUCOTROL) 5 MG tablet Take 1 tablet (5 mg total) by mouth 2 (two) times daily before a meal. 180 tablet 1   No current facility-administered medications on file prior to visit.    Review of Systems:  As per HPI, otherwise negative.    Physical Examination: Filed Vitals:   05/31/14 0820  BP: 132/94  Pulse: 75  Temp: 97.7 F (36.5 C)  Resp: 16   Filed Vitals:   05/31/14 0820  Height: 5' 11.25" (1.81 m)  Weight: 233 lb 9.6 oz (105.96  kg)   Body mass index is 32.34 kg/(m^2). Ideal Body Weight: Weight in (lb) to have BMI = 25: 180.1  GEN: WDWN, NAD, Non-toxic, A & O x 3 HEENT: Atraumatic, Normocephalic. Neck supple. No masses, No LAD. Ears and Nose: No external deformity. CV: RRR, No M/G/R. No JVD. No thrill. No extra heart sounds. PULM: CTA B, no wheezes, crackles, rhonchi. No retractions. No resp. distress. No accessory muscle use. ABD: S, NT, ND, +BS. No rebound. No HSM. EXTR: No c/c/e NEURO Normal gait.  PSYCH: Normally interactive. Conversant. Not depressed or anxious appearing.  Calm demeanor.    Assessment and Plan: NIDDM HBP HLD Lose weight for waist <38  Signed,  Ellison Carwin, MD

## 2014-06-01 LAB — PSA: PSA: 4.28 ng/mL — AB (ref <=4.00)

## 2014-06-01 NOTE — Addendum Note (Signed)
Addended by: Roselee Culver on: 06/01/2014 09:01 AM   Modules accepted: Orders

## 2014-07-23 DIAGNOSIS — C44319 Basal cell carcinoma of skin of other parts of face: Secondary | ICD-10-CM

## 2014-07-23 HISTORY — DX: Basal cell carcinoma of skin of other parts of face: C44.319

## 2014-11-09 ENCOUNTER — Other Ambulatory Visit: Payer: Self-pay | Admitting: Emergency Medicine

## 2014-12-27 ENCOUNTER — Ambulatory Visit (INDEPENDENT_AMBULATORY_CARE_PROVIDER_SITE_OTHER): Payer: 59 | Admitting: Physician Assistant

## 2014-12-27 VITALS — BP 124/78 | HR 83 | Temp 97.8°F | Resp 18 | Ht 71.5 in | Wt 226.0 lb

## 2014-12-27 DIAGNOSIS — E789 Disorder of lipoprotein metabolism, unspecified: Secondary | ICD-10-CM

## 2014-12-27 DIAGNOSIS — R972 Elevated prostate specific antigen [PSA]: Secondary | ICD-10-CM | POA: Diagnosis not present

## 2014-12-27 DIAGNOSIS — E119 Type 2 diabetes mellitus without complications: Secondary | ICD-10-CM | POA: Diagnosis not present

## 2014-12-27 DIAGNOSIS — I10 Essential (primary) hypertension: Secondary | ICD-10-CM

## 2014-12-27 DIAGNOSIS — Z7189 Other specified counseling: Secondary | ICD-10-CM | POA: Diagnosis not present

## 2014-12-27 LAB — COMPLETE METABOLIC PANEL WITH GFR
ALT: 74 U/L — ABNORMAL HIGH (ref 0–53)
AST: 48 U/L — ABNORMAL HIGH (ref 0–37)
Albumin: 4.8 g/dL (ref 3.5–5.2)
Alkaline Phosphatase: 71 U/L (ref 39–117)
BUN: 15 mg/dL (ref 6–23)
CO2: 29 meq/L (ref 19–32)
Calcium: 10.3 mg/dL (ref 8.4–10.5)
Chloride: 97 mEq/L (ref 96–112)
Creat: 0.94 mg/dL (ref 0.50–1.35)
GFR, Est African American: >89 mL/min
GFR, Est Non African American: >89 mL/min
Glucose, Bld: 141 mg/dL — ABNORMAL HIGH (ref 70–99)
Potassium: 3.9 mEq/L (ref 3.5–5.3)
Sodium: 138 mEq/L (ref 135–145)
Total Bilirubin: 0.7 mg/dL (ref 0.2–1.2)
Total Protein: 7.9 g/dL (ref 6.0–8.3)

## 2014-12-27 LAB — POCT URINALYSIS DIPSTICK
Bilirubin, UA: NEGATIVE
Blood, UA: NEGATIVE
Glucose, UA: NEGATIVE
Ketones, UA: NEGATIVE
Leukocytes, UA: NEGATIVE
Nitrite, UA: NEGATIVE
Protein, UA: 30
Spec Grav, UA: >=1.03
Urobilinogen, UA: 0.2
pH, UA: 5.5

## 2014-12-27 LAB — LIPID PANEL
CHOL/HDL RATIO: 4 ratio
Cholesterol: 177 mg/dL (ref 0–200)
HDL: 44 mg/dL (ref >=40)
LDL Cholesterol: 62 mg/dL (ref 0–99)
Triglycerides: 356 mg/dL — ABNORMAL HIGH (ref <150)
VLDL: 71 mg/dL — ABNORMAL HIGH (ref 0–40)

## 2014-12-27 LAB — POCT UA - MICROSCOPIC ONLY
BACTERIA, U MICROSCOPIC: NEGATIVE
Casts, Ur, LPF, POC: NEGATIVE
Crystals, Ur, HPF, POC: NEGATIVE
EPITHELIAL CELLS, URINE PER MICROSCOPY: NEGATIVE
MUCUS UA: NEGATIVE
SPERM: POSITIVE
YEAST UA: NEGATIVE

## 2014-12-27 LAB — MICROALBUMIN, URINE: Microalb, Ur: 5 mg/dL — ABNORMAL HIGH (ref <2.0)

## 2014-12-27 LAB — GLUCOSE, POCT (MANUAL RESULT ENTRY): POC Glucose: 132 mg/dL — AB (ref 70–99)

## 2014-12-27 LAB — POCT GLYCOSYLATED HEMOGLOBIN (HGB A1C): Hemoglobin A1C: 6.7

## 2014-12-27 LAB — PSA: PSA: 4.48 ng/mL — ABNORMAL HIGH (ref <=4.00)

## 2014-12-27 MED ORDER — METFORMIN HCL 1000 MG PO TABS: 1000.0000 mg | ORAL_TABLET | Freq: Two times a day (BID) | ORAL | Status: DC

## 2014-12-27 MED ORDER — SIMVASTATIN 20 MG PO TABS: 20.0000 mg | ORAL_TABLET | Freq: Every evening | ORAL | Status: DC

## 2014-12-27 MED ORDER — LISINOPRIL-HYDROCHLOROTHIAZIDE 20-25 MG PO TABS: 1.0000 | ORAL_TABLET | Freq: Every day | ORAL | Status: DC

## 2014-12-27 NOTE — Patient Instructions (Signed)
I will have your lab results returned within the next 10-14 days. Please continue to hydrate well with water (64oz which is about 4 regular sized water bottles, per day) Please increase your exercise regimen at this time.  You should be exercising 4 times per week with 30 minutes of aerobic work (heart pumping).  Diabetes and Exercise Exercising regularly is important. It is not just about losing weight. It has many health benefits, such as:  Improving your overall fitness, flexibility, and endurance.  Increasing your bone density.  Helping with weight control.  Decreasing your body fat.  Increasing your muscle strength.  Reducing stress and tension.  Improving your overall health. People with diabetes who exercise gain additional benefits because exercise:  Reduces appetite.  Improves the body's use of blood sugar (glucose).  Helps lower or control blood glucose.  Decreases blood pressure.  Helps control blood lipids (such as cholesterol and triglycerides).  Improves the body's use of the hormone insulin by:  Increasing the body's insulin sensitivity.  Reducing the body's insulin needs.  Decreases the risk for heart disease because exercising:  Lowers cholesterol and triglycerides levels.  Increases the levels of good cholesterol (such as high-density lipoproteins [HDL]) in the body.  Lowers blood glucose levels. YOUR ACTIVITY PLAN  Choose an activity that you enjoy and set realistic goals. Your health care provider or diabetes educator can help you make an activity plan that works for you. Exercise regularly as directed by your health care provider. This includes:  Performing resistance training twice a week such as push-ups, sit-ups, lifting weights, or using resistance bands.  Performing 150 minutes of cardio exercises each week such as walking, running, or playing sports.  Staying active and spending no more than 90 minutes at one time being inactive. Even  short bursts of exercise are good for you. Three 10-minute sessions spread throughout the day are just as beneficial as a single 30-minute session. Some exercise ideas include:  Taking the dog for a walk.  Taking the stairs instead of the elevator.  Dancing to your favorite song.  Doing an exercise video.  Doing your favorite exercise with a friend. RECOMMENDATIONS FOR EXERCISING WITH TYPE 1 OR TYPE 2 DIABETES   Check your blood glucose before exercising. If blood glucose levels are greater than 240 mg/dL, check for urine ketones. Do not exercise if ketones are present.  Avoid injecting insulin into areas of the body that are going to be exercised. For example, avoid injecting insulin into:  The arms when playing tennis.  The legs when jogging.  Keep a record of:  Food intake before and after you exercise.  Expected peak times of insulin action.  Blood glucose levels before and after you exercise.  The type and amount of exercise you have done.  Review your records with your health care provider. Your health care provider will help you to develop guidelines for adjusting food intake and insulin amounts before and after exercising.  If you take insulin or oral hypoglycemic agents, watch for signs and symptoms of hypoglycemia. They include:  Dizziness.  Shaking.  Sweating.  Chills.  Confusion.  Drink plenty of water while you exercise to prevent dehydration or heat stroke. Body water is lost during exercise and must be replaced.  Talk to your health care provider before starting an exercise program to make sure it is safe for you. Remember, almost any type of activity is better than none. Document Released: 09/29/2003 Document Revised: 11/23/2013 Document Reviewed: 12/16/2012  ExitCare Patient Information 2015 [employer]. This information is not intended to replace advice given to you by your health care provider. Make sure you discuss any questions you have with  your health care provider.

## 2014-12-27 NOTE — Progress Notes (Signed)
[facility] [address] [place] [address] [phone]  Date:  12/27/2014   Name:  Kevin Barnes   DOB:  01-Dec-1960   MRN:  [id number]  PCP:  Kennon Portela, MD    History of Present Illness:  Kevin Barnes is a 54 y.o. male patient who presents to [facility] for followup of his hypertension diabetes and hyperlipidemia, along with medication refill.  DM 2: Patient states that he is tolerating his medication well. He checks his fasting glucose once per week, which is his norm. He generally has a fasting glucose that range from 110 to 120s. At this time, he has no dietary were restrictions of what he eats, but does actively follow portion control. He is attempting to lose weight with goal of 220 for now.  HTN: patient is compliant on antihypertensive medication. He denies chest pain, palpitations, leg swelling, or shortness of breath.  He is not exercising as much lately. He states that he will start to increase his exercise regimen now that the weather has changed. He plans to walk with wife.  He is followed by a urologist for abnormal prostate. Last visit was in February. He has a shifting PSA level. He states that his urologist is not concerned because of its up and down, and without steady increase.  His next urology visit is in October, 4 months from now.  No urinary frequency, or dysuria at this time.     Patient Active Problem List   Diagnosis Date Noted  . HTN (hypertension) 10/09/2011  . Lipids abnormal 10/09/2011  . Diabetes mellitus 08/22/2011    Past Medical History  Diagnosis Date  . Obesity   . Diabetes mellitus   . Obesity   . Hyperlipidemia   . Hypertension   . Allergy     Past Surgical History  Procedure Laterality Date  . Prostate surgery      History  Substance Use Topics  . Smoking status: Former Research scientist (life sciences)  . Smokeless tobacco: Not on file  . Alcohol Use: 0.5 oz/week    1 drink(s) per week     Comment: 1 drink    Family  History  Problem Relation Age of Onset  . Cancer Father     Prostate  . Heart disease Father   . Hyperlipidemia Father   . Hypertension Father     No Known Allergies  Medication list has been reviewed and updated.  Current Outpatient Prescriptions on File Prior to Visit  Medication Sig Dispense Refill  . B Complex-C (B-COMPLEX WITH VITAMIN C) tablet Take 1 tablet by mouth daily.    . butalbital-acetaminophen-caffeine (ESGIC) 50-325-40 MG per tablet Take 1-2 tablets by mouth 2 (two) times daily as needed for headache. 40 tablet 5  . butalbital-acetaminophen-caffeine (FIORICET, ESGIC) 50-325-40 MG per tablet Take 1 tablet by mouth daily as needed for headache. 30 tablet 0  . Cholecalciferol (VITAMIN D) 2000 UNITS CAPS Take 1 capsule by mouth daily.    . cyclobenzaprine (FLEXERIL) 10 MG tablet Take 1 tablet (10 mg total) by mouth 2 (two) times daily as needed for muscle spasms. 10 tablet 0  . Flaxseed, Linseed, (FLAX SEED OIL) 1000 MG CAPS Take by mouth.    . fluticasone (FLONASE) 50 MCG/ACT nasal spray Place 2 sprays into the nose daily. 16 g 3  . lisinopril-hydrochlorothiazide (PRINZIDE,ZESTORETIC) 20-25 MG per tablet TAKE ONE TABLET BY MOUTH ONCE DAILY 30 tablet 0  . metFORMIN (GLUCOPHAGE) 1000 MG tablet  Take 1 tablet (1,000 mg total) by mouth 2 (two) times daily with a meal. 180 tablet 1  . sildenafil (VIAGRA) 50 MG tablet Take 2 tablets (100 mg total) by mouth as needed for erectile dysfunction. 6 tablet 12  . simvastatin (ZOCOR) 20 MG tablet Take 1 tablet (20 mg total) by mouth every evening. 90 tablet 1  . vitamin B-12 (CYANOCOBALAMIN) 100 MCG tablet Take 50 mcg by mouth daily.    Marland Kitchen glipiZIDE (GLUCOTROL) 5 MG tablet Take 1 tablet (5 mg total) by mouth 2 (two) times daily before a meal. (Patient not taking: Reported on 12/27/2014) 180 tablet 1   No current facility-administered medications on file prior to visit.    ROS ROS otherwise unremarkable unless listed above.  Physical  Examination: BP 124/78 mmHg  Pulse 83  Temp(Src) 97.8 F (36.6 C) (Oral)  Resp 18  Ht 5' 11.5" (1.816 m)  Wt 226 lb (102.513 kg)  BMI 31.08 kg/m2  SpO2 98% Ideal Body Weight: Weight in (lb) to have BMI = 25: 181.4 Wt Readings from Last 3 Encounters:  12/27/14 226 lb (102.513 kg)  05/31/14 233 lb 9.6 oz (105.96 kg)  10/19/13 230 lb 3.2 oz (104.418 kg)    Physical Exam Alert, operative, and oriented x4. PERRL with normal conjunctiva. Lungs sounds normal without wheezing or rhonchi. Regular rate and rhythm without murmurs, gallops, or rubs. No peripheral edema. 2+ DP pulses.    Results for orders placed or performed in visit on 12/27/14  POCT glucose (manual entry)  Result Value Ref Range   POC Glucose 132 (A) 70 - 99 mg/dl  POCT glycosylated hemoglobin (Hb A1C)  Result Value Ref Range   Hemoglobin A1C 6.7   POCT urinalysis dipstick  Result Value Ref Range   Color, UA yellow    Clarity, UA clear    Glucose, UA neg    Bilirubin, UA neg    Ketones, UA neg    Spec Grav, UA >=1.030    Blood, UA neg    pH, UA 5.5    Protein, UA 30    Urobilinogen, UA 0.2    Nitrite, UA neg    Leukocytes, UA Negative   POCT UA - Microscopic Only  Result Value Ref Range   WBC, Ur, HPF, POC 1-3    RBC, urine, microscopic 0-1    Bacteria, U Microscopic neg    Mucus, UA neg    Epithelial cells, urine per micros neg    Crystals, Ur, HPF, POC neg    Casts, Ur, LPF, POC neg    Yeast, UA neg    Sperm positive     Assessment and Plan: 54 year old male with PMH listed above, is here today for follow up of DM2, HTN, and hyperlipidemia. -Declines pneumococcal and TDAP today, but will get at physical in January.  1. Diabetes mellitus without complication Stable, will refill.  Advised to increase exercise regimen.   - POCT glucose (manual entry) - POCT glycosylated hemoglobin (Hb A1C) - POCT urinalysis dipstick - POCT UA - Microscopic Only - COMPLETE METABOLIC PANEL WITH GFR - Microalbumin,  urine - metFORMIN (GLUCOPHAGE) 1000 MG tablet; Take 1 tablet (1,000 mg total) by mouth 2 (two) times daily with a meal.  Dispense: 180 tablet; Refill: 1  2. Lipid disorder - Lipid panel - simvastatin (ZOCOR) 20 MG tablet; Take 1 tablet (20 mg total) by mouth every evening.  Dispense: 90 tablet; Refill: 2  3. Essential hypertension Stable, I will refill  medication. - POCT urinalysis dipstick - POCT UA - Microscopic Only - COMPLETE METABOLIC PANEL WITH GFR - lisinopril-hydrochlorothiazide (PRINZIDE,ZESTORETIC) 20-25 MG per tablet; Take 1 tablet by mouth daily.  Dispense: 90 tablet; Refill: 2  4. Encounter for medication review and counseling - simvastatin (ZOCOR) 20 MG tablet; Take 1 tablet (20 mg total) by mouth every evening.  Dispense: 90 tablet; Refill: 2  5. Elevated PSA, less than 10 ng/ml Patient followed by urology, but will order, per patient request. - PSA   Ivar Drape, PA-C [facility] 12/27/2014 8:26 AM

## 2015-06-20 ENCOUNTER — Other Ambulatory Visit: Payer: Self-pay | Admitting: Emergency Medicine

## 2015-09-11 ENCOUNTER — Other Ambulatory Visit: Payer: Self-pay | Admitting: Physician Assistant

## 2015-10-19 ENCOUNTER — Telehealth: Payer: Self-pay | Admitting: Internal Medicine

## 2015-10-19 NOTE — Telephone Encounter (Signed)
Ok to establish. Please schedule

## 2015-10-19 NOTE — Telephone Encounter (Signed)
Patient scheduled for 01/09/2016 at 3:00pm

## 2015-10-19 NOTE — Telephone Encounter (Signed)
Patient would like to establish with Dr. Birdie Riddle, patient is aware PCP is located in [place]. Please advise

## 2015-11-01 ENCOUNTER — Other Ambulatory Visit: Payer: Self-pay | Admitting: Physician Assistant

## 2015-11-07 ENCOUNTER — Telehealth: Payer: Self-pay | Admitting: General Practice

## 2015-11-07 DIAGNOSIS — E119 Type 2 diabetes mellitus without complications: Secondary | ICD-10-CM

## 2015-11-07 NOTE — Telephone Encounter (Signed)
Med denied, pt has never been seen in our office, needs a Diabetes follow up.

## 2015-11-11 ENCOUNTER — Other Ambulatory Visit: Payer: Self-pay | Admitting: Physician Assistant

## 2015-11-14 NOTE — Telephone Encounter (Signed)
Pt waiting to est care w/ a [facility] but appt not until June and they denied RF. Will give 2 wk w/note to RTC. We will have to see pt to continue.

## 2015-11-15 ENCOUNTER — Ambulatory Visit (INDEPENDENT_AMBULATORY_CARE_PROVIDER_SITE_OTHER): Payer: 59 | Admitting: Family Medicine

## 2015-11-15 VITALS — BP 122/86 | HR 85 | Temp 97.5°F | Resp 18 | Ht 71.0 in | Wt 230.0 lb

## 2015-11-15 DIAGNOSIS — G44209 Tension-type headache, unspecified, not intractable: Secondary | ICD-10-CM | POA: Diagnosis not present

## 2015-11-15 DIAGNOSIS — E119 Type 2 diabetes mellitus without complications: Secondary | ICD-10-CM

## 2015-11-15 DIAGNOSIS — I10 Essential (primary) hypertension: Secondary | ICD-10-CM | POA: Diagnosis not present

## 2015-11-15 DIAGNOSIS — Z7189 Other specified counseling: Secondary | ICD-10-CM

## 2015-11-15 DIAGNOSIS — N529 Male erectile dysfunction, unspecified: Secondary | ICD-10-CM | POA: Diagnosis not present

## 2015-11-15 DIAGNOSIS — E789 Disorder of lipoprotein metabolism, unspecified: Secondary | ICD-10-CM

## 2015-11-15 LAB — LIPID PANEL
CHOL/HDL RATIO: 4.7 ratio (ref <=5.0)
Cholesterol: 202 mg/dL — ABNORMAL HIGH (ref 125–200)
HDL: 43 mg/dL (ref >=40)
LDL Cholesterol: 96 mg/dL (ref <130)
Triglycerides: 314 mg/dL — ABNORMAL HIGH (ref <150)
VLDL: 63 mg/dL — AB (ref <30)

## 2015-11-15 LAB — COMPLETE METABOLIC PANEL WITH GFR
ALT: 72 U/L — AB (ref 9–46)
AST: 40 U/L — AB (ref 10–35)
Albumin: 4.3 g/dL (ref 3.6–5.1)
Alkaline Phosphatase: 67 U/L (ref 40–115)
BUN: 17 mg/dL (ref 7–25)
CHLORIDE: 97 mmol/L — AB (ref 98–110)
CO2: 28 mmol/L (ref 20–31)
CREATININE: 0.96 mg/dL (ref 0.70–1.33)
Calcium: 10.2 mg/dL (ref 8.6–10.3)
GFR, Est Non African American: 89 mL/min (ref >=60)
GLUCOSE: 184 mg/dL — AB (ref 65–99)
POTASSIUM: 5 mmol/L (ref 3.5–5.3)
Sodium: 137 mmol/L (ref 135–146)
Total Bilirubin: 0.6 mg/dL (ref 0.2–1.2)
Total Protein: 7.9 g/dL (ref 6.1–8.1)

## 2015-11-15 LAB — TSH: TSH: 0.84 m[IU]/L (ref 0.40–4.50)

## 2015-11-15 LAB — CBC
HCT: 46.4 % (ref 38.5–50.0)
Hemoglobin: 16.4 g/dL (ref 13.2–17.1)
MCH: 32.3 pg (ref 27.0–33.0)
MCHC: 35.3 g/dL (ref 32.0–36.0)
MCV: 91.3 fL (ref 80.0–100.0)
MPV: 9.6 fL (ref 7.5–12.5)
PLATELETS: 301 10*3/uL (ref 140–400)
RBC: 5.08 MIL/uL (ref 4.20–5.80)
RDW: 12.6 % (ref 11.0–15.0)
WBC: 8.1 10*3/uL (ref 3.8–10.8)

## 2015-11-15 LAB — HEMOGLOBIN A1C
HEMOGLOBIN A1C: 8.6 % — AB (ref <5.7)
Mean Plasma Glucose: 200 mg/dL

## 2015-11-15 MED ORDER — SIMVASTATIN 20 MG PO TABS: 20.0000 mg | ORAL_TABLET | Freq: Every evening | ORAL | Status: DC

## 2015-11-15 MED ORDER — LISINOPRIL-HYDROCHLOROTHIAZIDE 20-25 MG PO TABS: 1.0000 | ORAL_TABLET | Freq: Every day | ORAL | Status: DC

## 2015-11-15 MED ORDER — BUTALBITAL-APAP-CAFFEINE 50-325-40 MG PO TABS
1.0000 | ORAL_TABLET | Freq: Every day | ORAL | Status: DC | PRN
Start: 2015-11-15 — End: 2020-09-19

## 2015-11-15 MED ORDER — SILDENAFIL CITRATE 50 MG PO TABS: ORAL_TABLET | ORAL | Status: DC

## 2015-11-15 MED ORDER — METFORMIN HCL 1000 MG PO TABS: 1000.0000 mg | ORAL_TABLET | Freq: Two times a day (BID) | ORAL | Status: DC

## 2015-11-15 NOTE — Patient Instructions (Addendum)
Why follow it? Research shows. . Those who follow the Mediterranean diet have a reduced risk of heart disease  . The diet is associated with a reduced incidence of Parkinson's and Alzheimer's diseases . People following the diet may have longer life expectancies and lower rates of chronic diseases  . The Dietary Guidelines for Americans recommends the Mediterranean diet as an eating plan to promote health and prevent disease  What Is the Mediterranean Diet?  . Healthy eating plan based on typical foods and recipes of Mediterranean-style cooking . The diet is primarily a plant based diet; these foods should make up a majority of meals   Starches - Plant based foods should make up a majority of meals - They are an important sources of vitamins, minerals, energy, antioxidants, and fiber - Choose whole grains, foods high in fiber and minimally processed items  - Typical grain sources include wheat, oats, barley, corn, brown rice, bulgar, farro, millet, polenta, couscous  - Various types of beans include chickpeas, lentils, fava beans, black beans, white beans   Fruits  Veggies - Large quantities of antioxidant rich fruits & veggies; 6 or more servings  - Vegetables can be eaten raw or lightly drizzled with oil and cooked  - Vegetables common to the traditional Mediterranean Diet include: artichokes, arugula, beets, broccoli, brussel sprouts, cabbage, carrots, celery, collard greens, cucumbers, eggplant, kale, leeks, lemons, lettuce, mushrooms, okra, onions, peas, peppers, potatoes, pumpkin, radishes, rutabaga, shallots, spinach, sweet potatoes, turnips, zucchini - Fruits common to the Mediterranean Diet include: apples, apricots, avocados, cherries, clementines, dates, figs, grapefruits, grapes, melons, nectarines, oranges, peaches, pears, pomegranates, strawberries, tangerines  Fats - Replace butter and margarine with healthy oils, such as olive oil, canola oil, and tahini  - Limit nuts to no  more than a handful a day  - Nuts include walnuts, almonds, pecans, pistachios, pine nuts  - Limit or avoid candied, honey roasted or heavily salted nuts - Olives are central to the Mediterranean diet - can be eaten whole or used in a variety of dishes   Meats Protein - Limiting red meat: no more than a few times a month - When eating red meat: choose lean cuts and keep the portion to the size of deck of cards - Eggs: approx. 0 to 4 times a week  - Fish and lean poultry: at least 2 a week  - Healthy protein sources include, chicken, turkey, lean beef, lamb - Increase intake of seafood such as tuna, salmon, trout, mackerel, shrimp, scallops - Avoid or limit high fat processed meats such as sausage and bacon  Dairy - Include moderate amounts of low fat dairy products  - Focus on healthy dairy such as fat free yogurt, skim milk, low or reduced fat cheese - Limit dairy products higher in fat such as whole or 2% milk, cheese, ice cream  Alcohol - Moderate amounts of red wine is ok  - No more than 5 oz daily for women (all ages) and men older than age 65  - No more than 10 oz of wine daily for men younger than 65  Other - Limit sweets and other desserts  - Use herbs and spices instead of salt to flavor foods  - Herbs and spices common to the traditional Mediterranean Diet include: basil, bay leaves, chives, cloves, cumin, fennel, garlic, lavender, marjoram, mint, oregano, parsley, pepper, rosemary, sage, savory, sumac, tarragon, thyme   It's not just a diet, it's a lifestyle:  . The Mediterranean diet includes   lifestyle factors typical of those in the region  . Foods, drinks and meals are best eaten with others and savored . Daily physical activity is important for overall good health . This could be strenuous exercise like running and aerobics . This could also be more leisurely activities such as walking, housework, yard-work, or taking the stairs . Moderation is the key; a balanced and  healthy diet accommodates most foods and drinks . Consider portion sizes and frequency of consumption of certain foods   Meal Ideas & Options:  . Breakfast:  o Whole wheat toast or whole wheat English muffins with peanut butter & hard boiled egg o Steel cut oats topped with apples & cinnamon and skim milk  o Fresh fruit: banana, strawberries, melon, berries, peaches  o Smoothies: strawberries, bananas, greek yogurt, peanut butter o Low fat greek yogurt with blueberries and granola  o Egg white omelet with spinach and mushrooms o Breakfast couscous: whole wheat couscous, apricots, skim milk, cranberries  . Sandwiches:  o Hummus and grilled vegetables (peppers, zucchini, squash) on whole wheat bread   o Grilled chicken on whole wheat pita with lettuce, tomatoes, cucumbers or tzatziki  o Tuna salad on whole wheat bread: tuna salad made with greek yogurt, olives, red peppers, capers, green onions o Garlic rosemary lamb pita: lamb sauted with garlic, rosemary, salt & pepper; add lettuce, cucumber, greek yogurt to pita - flavor with lemon juice and black pepper  . Seafood:  o Mediterranean grilled salmon, seasoned with garlic, basil, parsley, lemon juice and black pepper o Shrimp, lemon, and spinach whole-grain pasta salad made with low fat greek yogurt  o Seared scallops with lemon orzo  o Seared tuna steaks seasoned salt, pepper, coriander topped with tomato mixture of olives, tomatoes, olive oil, minced garlic, parsley, green onions and cappers  . Meats:  o Herbed greek chicken salad with kalamata olives, cucumber, feta  o Red bell peppers stuffed with spinach, bulgur, lean ground beef (or lentils) & topped with feta   o Kebabs: skewers of chicken, tomatoes, onions, zucchini, squash  o [place] burgers: made with red onions, mint, dill, lemon juice, feta cheese topped with roasted red peppers . Vegetarian o Cucumber salad: cucumbers, artichoke hearts, celery, red onion, feta cheese, tossed in  olive oil & lemon juice  o Hummus and whole grain pita points with a greek salad (lettuce, tomato, feta, olives, cucumbers, red onion) o Lentil soup with celery, carrots made with vegetable broth, garlic, salt and pepper  o Tabouli salad: parsley, bulgur, mint, scallions, cucumbers, tomato, radishes, lemon juice, olive oil, salt and pepper.         IF you received an x-ray today, you will receive an invoice from [facility]. Please contact [facility] at [phone] with questions or concerns regarding your invoice.   IF you received labwork today, you will receive an invoice from [employer]. Please contact [facility] at [phone] with questions or concerns regarding your invoice.   Our billing staff will not be able to assist you with questions regarding bills from these companies.  You will be contacted with the lab results as soon as they are available. The fastest way to get your results is to activate your My Chart account. Instructions are located on the last page of this paperwork. If you have not heard from [place] regarding the results in 2 weeks, please contact this office.

## 2015-11-15 NOTE — Progress Notes (Signed)
Subjective:    Patient ID: Kevin Barnes, male    DOB: Dec 09, 1960, 55 y.o.   MRN: [id number]  HPI This is a 55 yo male who presents today for refills of his meds and blood work to follow DM, HTN, hyperlipidemia. He has an appointment to establish care with Dr. Birdie Riddle 6/17 and needs meds to get him through. He has a very stressful job. He has about 1 headache a week. These are related to stress/tension. He has been trying to walk more and he and his wife have started meditating.  He does not check his blood sugars at home. Has been feeling well. Energy level low unless he is on vacation when it is much improved.   Past Medical History  Diagnosis Date  . Obesity   . Diabetes mellitus   . Obesity   . Hyperlipidemia   . Hypertension   . Allergy    Past Surgical History  Procedure Laterality Date  . Prostate surgery     Family History  Problem Relation Age of Onset  . Cancer Father     Prostate  . Heart disease Father   . Hyperlipidemia Father   . Hypertension Father    Social History  Substance Use Topics  . Smoking status: Current Some Day Smoker  . Smokeless tobacco: None  . Alcohol Use: 0.6 oz/week    1 Standard drinks or equivalent per week     Comment: 1 drink     Review of Systems  Constitutional: Negative for activity change, fatigue and unexpected weight change.  Respiratory: Negative for shortness of breath.   Cardiovascular: Negative for chest pain, palpitations and leg swelling.  Endocrine: Negative for polydipsia, polyphagia and polyuria.  Neurological: Positive for headaches (tension, once a week, good relief with Fioricet).       Objective:   Physical Exam Physical Exam  Constitutional: Oriented to person, place, and time. He appears well-developed and well-nourished.  HENT:  Head: Normocephalic and atraumatic.  Eyes: Conjunctivae are normal.  Neck: Normal range of motion. Neck supple.  Cardiovascular: Normal rate, regular rhythm and normal heart  sounds.   Pulmonary/Chest: Effort normal and breath sounds normal.  Musculoskeletal: Normal range of motion.  Neurological: Alert and oriented to person, place, and time.  Skin: Skin is warm and dry.  Psychiatric: Normal mood and affect. Behavior is normal. Judgment and thought content normal.  Vitals reviewed.  BP 122/86 mmHg  Pulse 85  Temp(Src) 97.5 F (36.4 C)  Resp 18  Ht 5\' 11"  (1.803 m)  Wt 230 lb (104.327 kg)  BMI 32.09 kg/m2  SpO2 96% Wt Readings from Last 3 Encounters:  11/15/15 230 lb (104.327 kg)  12/27/14 226 lb (102.513 kg)  05/31/14 233 lb 9.6 oz (105.96 kg)       Assessment & Plan:  1. Diabetes mellitus without complication (HCC) - CBC - Hemoglobin A1c - COMPLETE METABOLIC PANEL WITH GFR - lisinopril-hydrochlorothiazide (PRINZIDE,ZESTORETIC) 20-25 MG tablet; Take 1 tablet by mouth daily.  Dispense: 90 tablet; Refill: 0 - metFORMIN (GLUCOPHAGE) 1000 MG tablet; Take 1 tablet (1,000 mg total) by mouth 2 (two) times daily with a meal.  Dispense: 90 tablet; Refill: 0  2. Lipid disorder - CBC - TSH - COMPLETE METABOLIC PANEL WITH GFR - Lipid panel - simvastatin (ZOCOR) 20 MG tablet; Take 1 tablet (20 mg total) by mouth every evening.  Dispense: 90 tablet; Refill: 0  3. Essential hypertension - CBC - TSH - COMPLETE METABOLIC PANEL WITH GFR -  Lipid panel - lisinopril-hydrochlorothiazide (PRINZIDE,ZESTORETIC) 20-25 MG tablet; Take 1 tablet by mouth daily.  Dispense: 90 tablet; Refill: 0  4. Tension headache - butalbital-acetaminophen-caffeine (FIORICET, ESGIC) 50-325-40 MG tablet; Take 1 tablet by mouth daily as needed for headache.  Dispense: 30 tablet; Refill: 0  5. Encounter for medication review and counseling - simvastatin (ZOCOR) 20 MG tablet; Take 1 tablet (20 mg total) by mouth every evening.  Dispense: 90 tablet; Refill: 0  6. Erectile dysfunction, unspecified erectile dysfunction type - sildenafil (VIAGRA) 50 MG tablet; TAKE TWO TABLETS BY MOUTH  AS NEEDED FOR ERECTILE DYSFUNCTION  Dispense: 6 tablet; Refill: 1  - he will establish care with Dr. Birdie Riddle 6/17.  - encouraged him to sign up for MyChart  Clarene Reamer, FNP-BC  [facility]  11/15/2015 9:21 AM

## 2016-01-09 ENCOUNTER — Encounter: Payer: Self-pay | Admitting: Family Medicine

## 2016-01-09 ENCOUNTER — Ambulatory Visit (INDEPENDENT_AMBULATORY_CARE_PROVIDER_SITE_OTHER): Payer: 59 | Admitting: Family Medicine

## 2016-01-09 VITALS — BP 121/84 | HR 87 | Temp 98.0°F | Resp 16 | Ht 71.0 in | Wt 230.1 lb

## 2016-01-09 DIAGNOSIS — E7889 Other lipoprotein metabolism disorders: Secondary | ICD-10-CM | POA: Diagnosis not present

## 2016-01-09 DIAGNOSIS — E119 Type 2 diabetes mellitus without complications: Secondary | ICD-10-CM

## 2016-01-09 DIAGNOSIS — I10 Essential (primary) hypertension: Secondary | ICD-10-CM

## 2016-01-09 DIAGNOSIS — Z23 Encounter for immunization: Secondary | ICD-10-CM | POA: Diagnosis not present

## 2016-01-09 NOTE — Patient Instructions (Signed)
Follow up in late August/early September to recheck diabetes Continue to work on healthy diet and regular exercise Schedule your eye exam and have them send me a copy of the report Call with any questions or concerns Welcome!  We're glad to have you!!!

## 2016-01-09 NOTE — Progress Notes (Signed)
Pre visit review using our clinic review tool, if applicable. No additional management support is needed unless otherwise documented below in the visit note. 

## 2016-01-09 NOTE — Progress Notes (Signed)
   Subjective:    Patient ID: Kevin Barnes, male    DOB: 1961-03-06, 55 y.o.   MRN: [id number]  HPI New to establish.  Previous MD- [facility]  [facility]- [facility]  HTN- chronic problem, well controlled on Lisinopril HCTZ.  No CP, SOB, HAs, visual changes, edema.  Hyperlipidemia- chronic problem, on Simvastatin daily.  Most recent LDL 96, total cholesterol 202.  Denies abd pain, N/V, myalgias  DM- chronic problem, on Metformin.  On ACE renal protection.  Pt's last A1C was 8.6 but he was halving his medication b/c he was running out.  Due for eye exam.  Exercising regularly.  Denies symptomatic lows.  No numbness/tingling of hands/feet.   Review of Systems For ROS see HPI     Objective:   Physical Exam  Constitutional: He is oriented to person, place, and time. He appears well-developed and well-nourished. No distress.  HENT:  Head: Normocephalic and atraumatic.  Eyes: Conjunctivae and EOM are normal. Pupils are equal, round, and reactive to light.  Neck: Normal range of motion. Neck supple. No thyromegaly present.  Cardiovascular: Normal rate, regular rhythm, normal heart sounds and intact distal pulses.   No murmur heard. Pulmonary/Chest: Effort normal and breath sounds normal. No respiratory distress.  Abdominal: Soft. Bowel sounds are normal. He exhibits no distension.  Musculoskeletal: He exhibits no edema.  Lymphadenopathy:    He has no cervical adenopathy.  Neurological: He is alert and oriented to person, place, and time. No cranial nerve deficit.  Skin: Skin is warm and dry.  Psychiatric: He has a normal mood and affect. His behavior is normal.  Vitals reviewed.         Assessment & Plan:

## 2016-01-10 NOTE — Assessment & Plan Note (Signed)
Chronic problem.  Well controlled.  Asymptomatic at this time.  Reviewed recent labs.  No need for med changes.  Will follow.

## 2016-01-10 NOTE — Assessment & Plan Note (Signed)
New to provider, ongoing for pt.  On Metformin w/o difficulty.  Recent A1C is misleading as pt was only taking 1/2 of his medication b/c he was running out.  Due for eye exam- pt plans to schedule next month.  Foot exam done today.  On ACE for renal protection.  Stressed need for healthy diet and regular exercise.  Will follow.

## 2016-01-10 NOTE — Assessment & Plan Note (Signed)
New to provider, ongoing for pt.  Tolerating statin w/o difficulty.  Reviewed recent labs- no need for med changes.  Stressed need for healthy diet and regular exercise.  Will follow.

## 2016-01-16 ENCOUNTER — Telehealth: Payer: Self-pay | Admitting: Family Medicine

## 2016-01-16 ENCOUNTER — Other Ambulatory Visit: Payer: Self-pay | Admitting: Family Medicine

## 2016-01-16 DIAGNOSIS — E119 Type 2 diabetes mellitus without complications: Secondary | ICD-10-CM

## 2016-01-16 MED ORDER — METFORMIN HCL 1000 MG PO TABS: 1000.0000 mg | ORAL_TABLET | Freq: Two times a day (BID) | ORAL | Status: DC

## 2016-01-16 NOTE — Telephone Encounter (Signed)
Medication filled to pharmacy as requested.   

## 2016-01-16 NOTE — Telephone Encounter (Signed)
Pt states that he needs a refill on metformin for a 90 day supply, pt states that past dr will not call this in, [facility] on [address]

## 2016-01-16 NOTE — Telephone Encounter (Signed)
Ok for 90 day supply w/ 1 refill

## 2016-03-23 ENCOUNTER — Telehealth: Payer: Self-pay | Admitting: Emergency Medicine

## 2016-03-23 DIAGNOSIS — I10 Essential (primary) hypertension: Secondary | ICD-10-CM

## 2016-03-23 DIAGNOSIS — E119 Type 2 diabetes mellitus without complications: Secondary | ICD-10-CM

## 2016-03-23 DIAGNOSIS — Z7189 Other specified counseling: Secondary | ICD-10-CM

## 2016-03-23 DIAGNOSIS — E789 Disorder of lipoprotein metabolism, unspecified: Secondary | ICD-10-CM

## 2016-03-23 MED ORDER — LISINOPRIL-HYDROCHLOROTHIAZIDE 20-25 MG PO TABS: 1.0000 | ORAL_TABLET | Freq: Every day | ORAL | 1 refills | Status: DC

## 2016-03-23 MED ORDER — SIMVASTATIN 20 MG PO TABS: 20.0000 mg | ORAL_TABLET | Freq: Every evening | ORAL | 1 refills | Status: DC

## 2016-03-23 NOTE — Telephone Encounter (Signed)
Patient called requesting a refill of his Lisinopril and Simvastatin. Last visit 01/09/16 to the [place].

## 2016-03-23 NOTE — Telephone Encounter (Signed)
Medication filled to pharmacy as requested.   

## 2016-04-03 ENCOUNTER — Encounter: Payer: Self-pay | Admitting: Family Medicine

## 2016-04-03 ENCOUNTER — Ambulatory Visit (INDEPENDENT_AMBULATORY_CARE_PROVIDER_SITE_OTHER): Payer: 59 | Admitting: Family Medicine

## 2016-04-03 VITALS — BP 121/81 | HR 80 | Temp 98.0°F | Resp 16 | Ht 71.0 in | Wt 230.1 lb

## 2016-04-03 DIAGNOSIS — E119 Type 2 diabetes mellitus without complications: Secondary | ICD-10-CM

## 2016-04-03 LAB — BASIC METABOLIC PANEL
BUN: 12 mg/dL (ref 6–23)
CALCIUM: 9.7 mg/dL (ref 8.4–10.5)
CO2: 35 meq/L — AB (ref 19–32)
Chloride: 94 mEq/L — ABNORMAL LOW (ref 96–112)
Creatinine, Ser: 0.87 mg/dL (ref 0.40–1.50)
GFR: 96.41 mL/min (ref >60.00)
GLUCOSE: 153 mg/dL — AB (ref 70–99)
POTASSIUM: 4.4 meq/L (ref 3.5–5.1)
SODIUM: 133 meq/L — AB (ref 135–145)

## 2016-04-03 LAB — HEMOGLOBIN A1C: HEMOGLOBIN A1C: 8 % — AB (ref 4.6–6.5)

## 2016-04-03 NOTE — Progress Notes (Signed)
   Subjective:    Patient ID: Kevin Barnes, male    DOB: 12/18/1960, 55 y.o.   MRN: [id number]  HPI DM- chronic problem, on Metformin twice daily.  On ACE for renal protection.  UTD on foot exam.  Due for eye exam.  Pt checks home CBGs 'periodically'- 120-180 depending on diet and time of day.  Not exercising regularly.  Pt has not had an eye exam, doesn't have a current eye doctor- plans on going to [facility].  Denies symptomatic lows.  No numbness or tingling of hands/feet.  Denies CP, SOB, HAs, visual changes, abd pain, N/V, edema.   Review of Systems For ROS see HPI     Objective:   Physical Exam  Constitutional: He is oriented to person, place, and time. He appears well-developed and well-nourished. No distress.  HENT:  Head: Normocephalic and atraumatic.  Eyes: Conjunctivae and EOM are normal. Pupils are equal, round, and reactive to light.  Neck: Normal range of motion. Neck supple. No thyromegaly present.  Cardiovascular: Normal rate, regular rhythm, normal heart sounds and intact distal pulses.   No murmur heard. Pulmonary/Chest: Effort normal and breath sounds normal. No respiratory distress.  Abdominal: Soft. Bowel sounds are normal. He exhibits no distension.  Musculoskeletal: He exhibits no edema.  Lymphadenopathy:    He has no cervical adenopathy.  Neurological: He is alert and oriented to person, place, and time. No cranial nerve deficit.  Skin: Skin is warm and dry.  Psychiatric: He has a normal mood and affect. His behavior is normal.  Vitals reviewed.         Assessment & Plan:

## 2016-04-03 NOTE — Assessment & Plan Note (Signed)
Chronic problem.  Pt's last A1C was high b/c he was breaking meds in half to make them last longer.  Reports home CBGs are 'good when I check'.  Asymptomatic.  Due for eye exam- pt plans to schedule.  Check labs.  Adjust meds prn

## 2016-04-03 NOTE — Patient Instructions (Signed)
Schedule your complete physical in 3-4 months We'll notify you of your lab results and make any changes if needed Continue to work on healthy diet and regular exercise- you can do it!!! Please get your eye exam!!! Call with any questions or concerns Happy Fall!!!

## 2016-04-03 NOTE — Progress Notes (Signed)
Pre visit review using our clinic review tool, if applicable. No additional management support is needed unless otherwise documented below in the visit note. 

## 2016-04-04 ENCOUNTER — Other Ambulatory Visit: Payer: Self-pay | Admitting: General Practice

## 2016-04-04 MED ORDER — SITAGLIPTIN PHOSPHATE 100 MG PO TABS: 100.0000 mg | ORAL_TABLET | Freq: Every day | ORAL | 6 refills | Status: DC

## 2016-04-09 ENCOUNTER — Other Ambulatory Visit: Payer: Self-pay | Admitting: General Practice

## 2016-04-09 MED ORDER — SAXAGLIPTIN HCL 5 MG PO TABS: 5.0000 mg | ORAL_TABLET | Freq: Every day | ORAL | 6 refills | Status: DC

## 2016-04-25 ENCOUNTER — Telehealth: Payer: Self-pay | Admitting: Family Medicine

## 2016-04-25 NOTE — Telephone Encounter (Signed)
Chart updated

## 2016-04-25 NOTE — Telephone Encounter (Signed)
Pt calling to let us know that he received his flu shot today at work.

## 2016-05-10 ENCOUNTER — Other Ambulatory Visit: Payer: Self-pay

## 2016-05-10 DIAGNOSIS — G44209 Tension-type headache, unspecified, not intractable: Secondary | ICD-10-CM

## 2016-05-10 NOTE — Telephone Encounter (Signed)
Pharm faxed req for RF of Fioricet. Kevin Barnes had written this for pt in April when he was last seen here for this med. He was given #30 w/no RFs. Do you want to RF?

## 2016-05-11 NOTE — Telephone Encounter (Signed)
Appears that patient is seeing Dr. Birdie Riddle as PCP. Has seen her x2 since last visit here. Patient should contact her office, or follow-up with her for refills.

## 2016-06-28 ENCOUNTER — Ambulatory Visit (INDEPENDENT_AMBULATORY_CARE_PROVIDER_SITE_OTHER): Payer: 59 | Admitting: Physician Assistant

## 2016-06-28 VITALS — BP 132/94 | HR 97 | Temp 97.9°F | Resp 17 | Ht 71.0 in | Wt 227.0 lb

## 2016-06-28 DIAGNOSIS — R059 Cough, unspecified: Secondary | ICD-10-CM

## 2016-06-28 DIAGNOSIS — R05 Cough: Secondary | ICD-10-CM | POA: Diagnosis not present

## 2016-06-28 DIAGNOSIS — J029 Acute pharyngitis, unspecified: Secondary | ICD-10-CM | POA: Diagnosis not present

## 2016-06-28 LAB — POCT RAPID STREP A (OFFICE): RAPID STREP A SCREEN: NEGATIVE

## 2016-06-28 MED ORDER — AZITHROMYCIN 250 MG PO TABS: ORAL_TABLET | ORAL | 0 refills | Status: DC

## 2016-06-28 NOTE — Progress Notes (Signed)
06/28/2016 9:35 AM   DOB: 1961/05/17 / MRN: [id number]  SUBJECTIVE:  Kevin Barnes is a 55 y.o. male presenting for sore thorat that started 4 dayas ago.  Associates mildly productive cough that started three days ago along with fatigue and nasal congestion.  Feels this is viral and and does wonder strep throat is a possibility. Has tried OTC cold and sinus along with Ibuprofen  and this has helped somewhat.     He has No Known Allergies.   He  has a past medical history of Allergy; Basal cell carcinoma of cheek (2016); Diabetes mellitus; Hyperlipidemia; Hypertension; Obesity; and Obesity.    He  reports that he has quit smoking. He has never used smokeless tobacco. He reports that he drinks about 0.6 oz of alcohol per week . He reports that he does not use drugs. He  reports that he currently engages in sexual activity. He reports using the following method of birth control/protection: None. The patient  has a past surgical history that includes Prostate surgery.  His family history includes Cancer in his father; Heart disease in his father; Hyperlipidemia in his father; Hypertension in his father; Stroke in his paternal grandfather.  Review of Systems  Constitutional: Positive for malaise/fatigue. Negative for chills, diaphoresis and fever.  HENT: Positive for congestion and sore throat.   Respiratory: Positive for cough. Negative for hemoptysis, shortness of breath and wheezing.   Cardiovascular: Negative for chest pain.  Gastrointestinal: Negative for nausea.  Skin: Negative for rash.  Neurological: Negative for dizziness and weakness.  Endo/Heme/Allergies: Negative for polydipsia.    The problem list and medications were reviewed and updated by myself where necessary and exist elsewhere in the encounter.   OBJECTIVE:  BP (!) 132/94 (BP Location: Right Arm, Patient Position: Sitting, Cuff Size: Normal)   Pulse 97   Temp 97.9 F (36.6 C) (Oral)   Resp 17   Ht 5\' 11"  (1.803 m)    Wt 227 lb (103 kg)   SpO2 96%   BMI 31.66 kg/m     Physical Exam  Constitutional: He is oriented to person, place, and time. He appears well-developed. He does not appear ill.  Eyes: Conjunctivae and EOM are normal. Pupils are equal, round, and reactive to light.  Cardiovascular: Normal rate, regular rhythm and normal heart sounds.   Pulmonary/Chest: Effort normal and breath sounds normal. No respiratory distress. He has no wheezes. He has no rales. He exhibits no tenderness.  Abdominal: He exhibits no distension.  Musculoskeletal: Normal range of motion.  Neurological: He is alert and oriented to person, place, and time. No cranial nerve deficit. Coordination normal.  Skin: Skin is warm and dry. He is not diaphoretic.  Psychiatric: He has a normal mood and affect.  Nursing note and vitals reviewed.   Results for orders placed or performed in visit on 06/28/16 (from the past 72 hour(s))  POCT rapid strep A     Status: None   Collection Time: 06/28/16  9:31 AM  Result Value Ref Range   Rapid Strep A Screen Negative Negative    No results found.  ASSESSMENT AND PLAN  Seyon was seen today for sore throat.  Diagnoses and all orders for this visit:  Sore throat: Rapid negative. Culture pending. Abx if not improving by day 10.   -     POCT rapid strep A -     Culture, Group A Strep  Cough -     azithromycin (ZITHROMAX) 250 MG  tablet; Take two on day one and one daily thereafter.  Please allow at least ten days of total illness before filling abx.    The patient is advised to call or return to clinic if he does not see an improvement in symptoms, or to seek the care of the closest emergency department if he worsens with the above plan.   Philis Fendt, MHS, PA-C [facility] 06/28/2016 9:35 AM

## 2016-06-28 NOTE — Patient Instructions (Signed)
     IF you received an x-ray today, you will receive an invoice from [facility]. Please contact [facility] at [phone] with questions or concerns regarding your invoice.   IF you received labwork today, you will receive an invoice from [facility]. Please contact [facility] at [phone] with questions or concerns regarding your invoice.   Our billing staff will not be able to assist you with questions regarding bills from these companies.  You will be contacted with the lab results as soon as they are available. The fastest way to get your results is to activate your My Chart account. Instructions are located on the last page of this paperwork. If you have not heard from us regarding the results in 2 weeks, please contact this office.      

## 2016-07-01 LAB — CULTURE, GROUP A STREP

## 2016-07-02 NOTE — Progress Notes (Signed)
Lets given him a call.  Advise that if his symptoms are not improving it is okay to start abx. If he is taking azith and not feeling better then I want to call in Amox [phone] for 10 days.

## 2016-07-03 ENCOUNTER — Ambulatory Visit (INDEPENDENT_AMBULATORY_CARE_PROVIDER_SITE_OTHER): Payer: 59 | Admitting: Family Medicine

## 2016-07-03 ENCOUNTER — Ambulatory Visit (INDEPENDENT_AMBULATORY_CARE_PROVIDER_SITE_OTHER): Payer: 59

## 2016-07-03 ENCOUNTER — Encounter: Payer: Self-pay | Admitting: Family Medicine

## 2016-07-03 VITALS — BP 150/80 | HR 118 | Temp 98.3°F | Resp 18 | Ht 71.0 in | Wt 223.8 lb

## 2016-07-03 DIAGNOSIS — R05 Cough: Secondary | ICD-10-CM | POA: Diagnosis not present

## 2016-07-03 DIAGNOSIS — R059 Cough, unspecified: Secondary | ICD-10-CM

## 2016-07-03 DIAGNOSIS — E119 Type 2 diabetes mellitus without complications: Secondary | ICD-10-CM | POA: Diagnosis not present

## 2016-07-03 DIAGNOSIS — J0100 Acute maxillary sinusitis, unspecified: Secondary | ICD-10-CM

## 2016-07-03 LAB — POCT INFLUENZA A/B
INFLUENZA B, POC: NEGATIVE
Influenza A, POC: NEGATIVE

## 2016-07-03 LAB — GLUCOSE, POCT (MANUAL RESULT ENTRY): POC Glucose: 179 mg/dl — AB (ref 70–99)

## 2016-07-03 MED ORDER — GUAIFENESIN-CODEINE 100-10 MG/5ML PO SOLN: 5.0000 mL | Freq: Four times a day (QID) | ORAL | 0 refills | Status: DC | PRN

## 2016-07-03 MED ORDER — AMOXICILLIN-POT CLAVULANATE 875-125 MG PO TABS: 1.0000 | ORAL_TABLET | Freq: Two times a day (BID) | ORAL | 0 refills | Status: DC

## 2016-07-03 NOTE — Progress Notes (Signed)
Subjective:    Patient ID: Kevin Barnes, male    DOB: 1960-07-27, 56 y.o.   MRN: [id number]  07/03/2016  Follow-up; Cough (still coughing a lot. cough all day/night; coughing up green sputum); Sore Throat (getting better); and Fever (states still running fever up to 100 last night)   HPI This 55 y.o. male presents for five day follow-up on cough. Evaluated by PA Clark on 06/28/16 for sore throat, fatigue, nasal congestion.  Rapid strep negative; sent throat culture +b hemolytic non Group A strep isloated. Rx for Zpack provided and advised to start in 10 days if not improved. Pt contacted on 07/02/16; started Zpack on Saturday and started feeling better by Sunday/two days ago. Feeling better from onset but slowly.  Main concern is doctor's note; there is no way; still running a fever which is concerning.  Also, does not do well with Zpack but started taking it anyways.  Old school; has always taken a longer dose of abx.  Explosive diarrhea with Zpack; taking Imodium.  Fever Tmax 99.8 last night-101.0 two days ago.  +chills/sweats.  No body aches; chest soreness due to coughing.  No headache.  Mild ear itching and pain mild B.  +nasal congestion now and worsening; taking Mucinex to help break up; every morning coughing up congestion after hot shower.  +sputum production scant.  Sore throat was severe x 5 days with improvement. Mild SOB a bit.  No wheezing. No n/v; one episode of post-tussive emesis.  [employer]; phone.  Using OTC nasal spray to break up congestion; Afrin.  No tobacco.  Sugars running not checking in a while.  Not eating much; drinking good amount.  Taking medication for diabetes.   Onset ninth dya.    Review of Systems  Constitutional: Negative for activity change, appetite change, chills, diaphoresis, fatigue and fever.  HENT: Positive for congestion, postnasal drip and rhinorrhea.   Respiratory: Positive for shortness of breath. Negative for cough.   Cardiovascular:  Negative for chest pain, palpitations and leg swelling.  Gastrointestinal: Negative for abdominal pain, diarrhea, nausea and vomiting.  Endocrine: Negative for cold intolerance, heat intolerance, polydipsia, polyphagia and polyuria.  Skin: Negative for color change, rash and wound.  Neurological: Negative for dizziness, tremors, seizures, syncope, facial asymmetry, speech difficulty, weakness, light-headedness, numbness and headaches.  Psychiatric/Behavioral: Negative for dysphoric mood and sleep disturbance. The patient is not nervous/anxious.     Past Medical History:  Diagnosis Date  . Allergy   . Basal cell carcinoma of cheek 2016  . Diabetes mellitus   . Hyperlipidemia   . Hypertension   . Obesity   . Obesity    Past Surgical History:  Procedure Laterality Date  . PROSTATE SURGERY     No Known Allergies  Social History   Social History  . Marital status: Married    Spouse name: N/A  . Number of children: N/A  . Years of education: 91   Occupational History  . dispatcher [employer]   Social History Main Topics  . Smoking status: Former Research scientist (life sciences)  . Smokeless tobacco: Never Used  . Alcohol use 0.6 oz/week    1 Standard drinks or equivalent per week     Comment: 1 drink  . Drug use: No  . Sexual activity: Yes    Birth control/ protection: None   Other Topics Concern  . Not on file   Social History Narrative  . No narrative on file   Family History  Problem Relation  Age of Onset  . Cancer Father     Prostate  . Heart disease Father   . Hyperlipidemia Father   . Hypertension Father   . Stroke Paternal Grandfather        Objective:    BP (!) 150/80 (BP Location: Right Arm, Patient Position: Sitting, Cuff Size: Normal)   Pulse (!) 118   Temp 98.3 F (36.8 C) (Oral)   Resp 18   Ht 5\' 11"  (1.803 m)   Wt 223 lb 12.8 oz (101.5 kg)   SpO2 96%   BMI 31.21 kg/m  Physical Exam  Constitutional: He is oriented to person, place, and time. He appears  well-developed and well-nourished. No distress.  HENT:  Head: Normocephalic and atraumatic.  Right Ear: External ear normal.  Left Ear: External ear normal.  Nose: Nose normal.  Mouth/Throat: Oropharynx is clear and moist.  Eyes: Conjunctivae and EOM are normal. Pupils are equal, round, and reactive to light.  Neck: Normal range of motion. Neck supple. Carotid bruit is not present. No thyromegaly present.  Cardiovascular: Normal rate, regular rhythm, normal heart sounds and intact distal pulses.  Exam reveals no gallop and no friction rub.   No murmur heard. Pulmonary/Chest: Effort normal and breath sounds normal. He has no wheezes. He has no rales.  Abdominal: Soft. Bowel sounds are normal. He exhibits no distension and no mass. There is no tenderness. There is no rebound and no guarding.  Lymphadenopathy:    He has no cervical adenopathy.  Neurological: He is alert and oriented to person, place, and time. No cranial nerve deficit.  Skin: Skin is warm and dry. No rash noted. He is not diaphoretic.  Psychiatric: He has a normal mood and affect. His behavior is normal.  Nursing note and vitals reviewed.  Results for orders placed or performed in visit on 06/28/16  Culture, Group A Strep  Result Value Ref Range   Strep A Culture Comment (A)   POCT rapid strep A  Result Value Ref Range   Rapid Strep A Screen Negative Negative       Assessment & Plan:   1. Acute non-recurrent maxillary sinusitis   2. Cough   3. Type 2 diabetes mellitus without complication, without long-term current use of insulin ([place])     Orders Placed This Encounter  Procedures  . DG Chest 2 View    Standing Status:   Future    Number of Occurrences:   1    Standing Expiration Date:   07/03/2017    Order Specific Question:   Reason for Exam (SYMPTOM  OR DIAGNOSIS REQUIRED)    Answer:   cough, fever, head congestion    Order Specific Question:   Preferred imaging location?    Answer:   External  . POCT  [place]flu[place] A/B  . POCT glucose (manual entry)   Meds ordered this encounter  Medications  . amoxicillin-clavulanate (AUGMENTIN) 875-125 MG tablet    Sig: Take 1 tablet by mouth 2 (two) times daily.    Dispense:  20 tablet    Refill:  0  . guaiFENesin-codeine 100-10 MG/5ML syrup    Sig: Take 5-10 mLs by mouth every 6 (six) hours as needed for cough.    Dispense:  180 mL    Refill:  0    No Follow-up on file.   Keileigh Vahey Elayne Guerin, M.D. [facility] [address] [place], [place]  [address] [phone] phone [phone] fax

## 2016-07-03 NOTE — Patient Instructions (Addendum)
  1.  Continue Mucinex as prescribed. 2. Start Augmentin; stop Zpack. 3.  Start Robitussin with codeine for cough. 4. Continue nasal spray as recommended. 5. Check sugars daily.    IF you received an x-ray today, you will receive an invoice from [facility]. Please contact [facility] at [phone] with questions or concerns regarding your invoice.   IF you received labwork today, you will receive an invoice from [employer]. Please contact [facility] at [phone] with questions or concerns regarding your invoice.   Our billing staff will not be able to assist you with questions regarding bills from these companies.  You will be contacted with the lab results as soon as they are available. The fastest way to get your results is to activate your My Chart account. Instructions are located on the last page of this paperwork. If you have not heard from [place] regarding the results in 2 weeks, please contact this office.

## 2016-07-12 ENCOUNTER — Other Ambulatory Visit: Payer: Self-pay | Admitting: Family Medicine

## 2016-07-12 DIAGNOSIS — E119 Type 2 diabetes mellitus without complications: Secondary | ICD-10-CM

## 2016-08-14 LAB — HM DIABETES EYE EXAM

## 2016-09-03 ENCOUNTER — Ambulatory Visit: Payer: 59 | Admitting: Family Medicine

## 2016-09-04 ENCOUNTER — Ambulatory Visit (INDEPENDENT_AMBULATORY_CARE_PROVIDER_SITE_OTHER): Payer: 59 | Admitting: Family Medicine

## 2016-09-04 ENCOUNTER — Other Ambulatory Visit: Payer: Self-pay | Admitting: General Practice

## 2016-09-04 ENCOUNTER — Encounter: Payer: Self-pay | Admitting: Family Medicine

## 2016-09-04 VITALS — BP 128/81 | HR 84 | Temp 98.2°F | Resp 16 | Ht 71.0 in | Wt 227.5 lb

## 2016-09-04 DIAGNOSIS — I10 Essential (primary) hypertension: Secondary | ICD-10-CM

## 2016-09-04 DIAGNOSIS — E119 Type 2 diabetes mellitus without complications: Secondary | ICD-10-CM | POA: Diagnosis not present

## 2016-09-04 DIAGNOSIS — E1169 Type 2 diabetes mellitus with other specified complication: Secondary | ICD-10-CM

## 2016-09-04 DIAGNOSIS — E785 Hyperlipidemia, unspecified: Secondary | ICD-10-CM | POA: Diagnosis not present

## 2016-09-04 LAB — LIPID PANEL
Cholesterol: 173 mg/dL (ref 0–200)
HDL: 44.3 mg/dL (ref >39.00)
Total CHOL/HDL Ratio: 4
Triglycerides: 518 mg/dL — ABNORMAL HIGH (ref 0.0–149.0)

## 2016-09-04 LAB — CBC WITH DIFFERENTIAL/PLATELET
BASOS ABS: 0 10*3/uL (ref 0.0–0.1)
Basophils Relative: 0.4 % (ref 0.0–3.0)
EOS PCT: 4.5 % (ref 0.0–5.0)
Eosinophils Absolute: 0.4 10*3/uL (ref 0.0–0.7)
HCT: 45.9 % (ref 39.0–52.0)
Hemoglobin: 15.8 g/dL (ref 13.0–17.0)
LYMPHS ABS: 2.8 10*3/uL (ref 0.7–4.0)
Lymphocytes Relative: 31.2 % (ref 12.0–46.0)
MCHC: 34.3 g/dL (ref 30.0–36.0)
MCV: 92.4 fl (ref 78.0–100.0)
MONO ABS: 0.6 10*3/uL (ref 0.1–1.0)
Monocytes Relative: 6.9 % (ref 3.0–12.0)
NEUTROS PCT: 57 % (ref 43.0–77.0)
Neutro Abs: 5.1 10*3/uL (ref 1.4–7.7)
Platelets: 321 10*3/uL (ref 150.0–400.0)
RBC: 4.97 Mil/uL (ref 4.22–5.81)
RDW: 12.2 % (ref 11.5–15.5)
WBC: 9 10*3/uL (ref 4.0–10.5)

## 2016-09-04 LAB — HEPATIC FUNCTION PANEL
ALBUMIN: 4.3 g/dL (ref 3.5–5.2)
ALK PHOS: 71 U/L (ref 39–117)
ALT: 38 U/L (ref 0–53)
AST: 30 U/L (ref 0–37)
Bilirubin, Direct: 0.1 mg/dL (ref 0.0–0.3)
TOTAL PROTEIN: 7.4 g/dL (ref 6.0–8.3)
Total Bilirubin: 0.6 mg/dL (ref 0.2–1.2)

## 2016-09-04 LAB — BASIC METABOLIC PANEL
BUN: 13 mg/dL (ref 6–23)
CHLORIDE: 97 meq/L (ref 96–112)
CO2: 30 mEq/L (ref 19–32)
Calcium: 9.7 mg/dL (ref 8.4–10.5)
Creatinine, Ser: 0.73 mg/dL (ref 0.40–1.50)
GFR: 117.86 mL/min (ref >60.00)
GLUCOSE: 208 mg/dL — AB (ref 70–99)
POTASSIUM: 4.2 meq/L (ref 3.5–5.1)
Sodium: 133 mEq/L — ABNORMAL LOW (ref 135–145)

## 2016-09-04 LAB — TSH: TSH: 0.86 u[IU]/mL (ref 0.35–4.50)

## 2016-09-04 LAB — LDL CHOLESTEROL, DIRECT: LDL DIRECT: 63 mg/dL

## 2016-09-04 LAB — HEMOGLOBIN A1C: Hgb A1c MFr Bld: 9.2 % — ABNORMAL HIGH (ref 4.6–6.5)

## 2016-09-04 MED ORDER — GLUCOSE BLOOD VI STRP: ORAL_STRIP | 12 refills | Status: DC

## 2016-09-04 NOTE — Progress Notes (Signed)
Pre visit review using our clinic review tool, if applicable. No additional management support is needed unless otherwise documented below in the visit note. 

## 2016-09-04 NOTE — Progress Notes (Signed)
   Subjective:    Patient ID: Kevin Barnes, male    DOB: 07-06-1961, 56 y.o.   MRN: [id number]  HPI HTN- chronic problem.  Adequate control.  On Lisinopril HCTZ daily.  No CP, SOB, HAs, visual changes, edema.   Hyperlipidemia- chronic problem, on Simvastatin and fish oil daily.  No regular exercise.  Denies abd pain, N/V, myalgias.  DM- chronic problem, on Metformin 1000mg  BID and Onglyza 5mg  daily.  On ACE for renal protection.  UTD on foot exam, eye exam.  Rare symptomatic lows when he skips meals. No numbness/tingling of hands/feet.    Review of Systems For ROS see HPI     Objective:   Physical Exam  Constitutional: He is oriented to person, place, and time. He appears well-developed and well-nourished. No distress.  HENT:  Head: Normocephalic and atraumatic.  Eyes: Conjunctivae and EOM are normal. Pupils are equal, round, and reactive to light.  Neck: Normal range of motion. Neck supple. No thyromegaly present.  Cardiovascular: Normal rate, regular rhythm, normal heart sounds and intact distal pulses.   No murmur heard. Pulmonary/Chest: Effort normal and breath sounds normal. No respiratory distress.  Abdominal: Soft. Bowel sounds are normal. He exhibits no distension.  Musculoskeletal: He exhibits no edema.  Lymphadenopathy:    He has no cervical adenopathy.  Neurological: He is alert and oriented to person, place, and time. No cranial nerve deficit.  Skin: Skin is warm and dry.  Psychiatric: He has a normal mood and affect. His behavior is normal.  Vitals reviewed.         Assessment & Plan:

## 2016-09-04 NOTE — Assessment & Plan Note (Signed)
Chronic problem.  Tolerating statin w/o difficulty.  Stressed need for healthy diet and regular exercise.  Check labs.  Adjust meds prn  

## 2016-09-04 NOTE — Assessment & Plan Note (Signed)
Chronic problem.  Tolerating meds w/o difficulty.  Rare symptomatic lows when he skips breakfast.  UTD on foot exam, eye exam, and on ACE for renal protection.  Stressed need for healthy diet and regular exercise.  Check labs.  Adjust meds prn

## 2016-09-04 NOTE — Patient Instructions (Signed)
Follow up in 3-4 months to recheck diabetes We'll notify you of your lab results and make any changes if needed Continue to work on healthy diet and regular exercise- you can do it! Call with any questions or concerns Happy Valentine's Day!!! 

## 2016-09-04 NOTE — Assessment & Plan Note (Signed)
Chronic problem.  Adequate control today.  Asymptomatic at this time.  Check labs.  No anticipated med changes.  Will follow.

## 2016-09-06 ENCOUNTER — Other Ambulatory Visit: Payer: Self-pay | Admitting: General Practice

## 2016-09-06 DIAGNOSIS — E119 Type 2 diabetes mellitus without complications: Secondary | ICD-10-CM

## 2016-09-06 MED ORDER — EMPAGLIFLOZIN 10 MG PO TABS: 10.0000 mg | ORAL_TABLET | Freq: Every day | ORAL | 6 refills | Status: DC

## 2016-09-06 MED ORDER — FENOFIBRATE 160 MG PO TABS: 160.0000 mg | ORAL_TABLET | Freq: Every day | ORAL | 6 refills | Status: DC

## 2016-09-17 ENCOUNTER — Other Ambulatory Visit: Payer: Self-pay | Admitting: Family Medicine

## 2016-09-17 DIAGNOSIS — E119 Type 2 diabetes mellitus without complications: Secondary | ICD-10-CM

## 2016-09-17 DIAGNOSIS — I10 Essential (primary) hypertension: Secondary | ICD-10-CM

## 2016-10-04 ENCOUNTER — Other Ambulatory Visit: Payer: Self-pay | Admitting: Family Medicine

## 2016-10-04 DIAGNOSIS — E789 Disorder of lipoprotein metabolism, unspecified: Secondary | ICD-10-CM

## 2016-10-04 DIAGNOSIS — Z7189 Other specified counseling: Secondary | ICD-10-CM

## 2016-10-04 DIAGNOSIS — E119 Type 2 diabetes mellitus without complications: Secondary | ICD-10-CM

## 2016-10-21 NOTE — Progress Notes (Signed)
Patient ID: Kevin Barnes, male   DOB: [birth year], 56 y.o.   MRN: [id number]            Reason for Appointment: Consultation for Type 2 Diabetes  Referring physician: Birdie Riddle   History of Present Illness:          Date of diagnosis of type 2 diabetes mellitus:  2013      Background history:  He was diagnosed to have diabetes with a glucose of 132 in 2013 and baseline A1c of 6.4 He was started on metformin in 01/2013 when blood sugars were higher His blood sugars were reasonably controlled in the first 2 years or so, he was also given glipizide initially in 2015 but this caused hypoglycemia and he did not continue this. He had been followed up relatively sporadically He was also tried on Januvia subsequently in 2017 when his blood sugars were higher but this was changed to [place]  He did not take this because of the cost   Recent history:   He has been referred here because of his A1c being 9.2 done in 2/80  Non-insulin hypoglycemic drugs the patient is taking are: Glipizide 2.5 mg twice a day, Jardiance 10 mg daily, Metformin 1g bid  Current management, blood sugar patterns and problems identified:  Patient had not been checking his blood sugars for about a year until he was seen in 2/18 and his lab glucose was 208.  At that time he was having some fatigue also  He was told to start Jardiance 10 mg daily in addition to his metformin but he did not think it helped his sugar  On his own started taking an old prescription of glipizide 5 mg from 2015, taking half tablet twice a day.  He thinks this improved his blood sugar control  However he has still continued to have relatively high fasting blood sugars  Blood sugars are better during the day but he is checking only rarely after supper, last night glucose was 189 after a light supper.  He is working 4 days a week and frequently eating out, some of his meals are from vending machines and not low fat.  Does not exercise  except maybe once a week           Side effects from medications have been: None, tries to take metformin with food  Compliance with the medical regimen: Inconsistent Hypoglycemia:   History as he feels low sugars if the blood sugar is <110  Glucose monitoring:  done 1 times a day         Glucometer: One Touch Verio .       Blood Glucose readings by time of day and averages from meter download:  PREMEAL Breakfast Lunch Dinner Bedtime  Overall   Glucose range: 130-230 106-262   123,189    Median: 175     145   Self-care: The diet that the patient has been following is: tries to limit Drinks with sugar .     Meal times are:  Breakfast is at Lunch: Dinner: 7 pm    Typical meal intake: Breakfast is usually skipped.  He will have a sandwich or biscuit late morning His snacks will be peanut butter crackers or chips. Usually avoiding regular soft drinks                Dietician visit, most recent: Never               Exercise:  walking  about once weekly  Weight history:  Wt Readings from Last 3 Encounters:  10/22/16 223 lb (101.2 kg)  09/04/16 227 lb 8 oz (103.2 kg)  07/03/16 223 lb 12.8 oz (101.5 kg)    Glycemic control:   Lab Results  Component Value Date   HGBA1C 9.2 (H) 09/04/2016   HGBA1C 8.0 (H) 04/03/2016   HGBA1C 8.6 (H) 11/15/2015   Lab Results  Component Value Date   MICROALBUR 5.0 (H) 12/27/2014   LDLCALC 96 11/15/2015   CREATININE 0.73 09/04/2016   No results found for: MICRALBCREAT  No results found for: FRUCTOSAMINE    Allergies as of 10/22/2016   No Known Allergies     Medication List       Accurate as of 10/22/16 12:42 PM. Always use your most recent med list.          aspirin 81 MG tablet Take 81 mg by mouth daily.   B-12 5000 MCG Subl Place under the tongue.   butalbital-acetaminophen-caffeine 50-325-40 MG tablet Commonly known as:  FIORICET, ESGIC Take 1 tablet by mouth daily as needed for headache.   empagliflozin 10 MG Tabs  tablet Commonly known as:  JARDIANCE Take 10 mg by mouth daily.   fenofibrate 160 MG tablet Take 1 tablet (160 mg total) by mouth daily.   glipiZIDE 2.5 MG 24 hr tablet Commonly known as:  GLUCOTROL XL Take 1 tablet (2.5 mg total) by mouth daily with breakfast.   glucose blood test strip Commonly known as:  ONETOUCH VERIO Use one strip each time sugars are tested, pt checks sugars 1-2 times daily. Dx E11.9   lisinopril-hydrochlorothiazide 20-25 MG tablet Commonly known as:  PRINZIDE,ZESTORETIC TAKE ONE TABLET BY MOUTH ONCE DAILY   metFORMIN 1000 MG tablet Commonly known as:  GLUCOPHAGE Take 1 tablet (1,000 mg total) by mouth 2 (two) times daily with a meal.   Omega-3 Krill Oil 1000 MG Caps Take by mouth.   OVER THE COUNTER MEDICATION Sunny Mood and Curamin   sildenafil 50 MG tablet Commonly known as:  VIAGRA TAKE TWO TABLETS BY MOUTH AS NEEDED FOR ERECTILE DYSFUNCTION   simvastatin 20 MG tablet Commonly known as:  ZOCOR TAKE ONE TABLET BY MOUTH ONCE DAILY IN THE EVENING       Allergies: No Known Allergies  Past Medical History:  Diagnosis Date  . Allergy   . Basal cell carcinoma of cheek 2016  . Diabetes mellitus   . Hyperlipidemia   . Hypertension   . Obesity   . Obesity     Past Surgical History:  Procedure Laterality Date  . PROSTATE SURGERY      Family History  Problem Relation Age of Onset  . Cancer Father     Prostate  . Heart disease Father   . Hyperlipidemia Father   . Hypertension Father   . Stroke Paternal Grandfather   . Diabetes Neg Hx     Social History:  reports that he has quit smoking. He has never used smokeless tobacco. He reports that he drinks about 0.6 oz of alcohol per week . He reports that he does not use drugs.   Review of Systems  Constitutional:       Has lost 4 pounds recently  Eyes: Negative for visual disturbance.  Respiratory: Negative for daytime sleepiness and shortness of breath.   Cardiovascular: Negative  for chest pain.  Gastrointestinal: Negative for diarrhea.  Endocrine: Positive for erectile dysfunction. Negative for fatigue, general weakness and polydipsia.  Has been prescribed Viagra for ED  Genitourinary: Negative for nocturia and slow stream.  Musculoskeletal: Negative for joint pain.  Skin: Negative for rash.  Neurological: Negative for numbness and tingling.  Psychiatric/Behavioral: Negative for insomnia.     Lipid history: He has had persistently high triglyceride, now started fenofibrate in addition to his Lipitor    Lab Results  Component Value Date   CHOL 173 09/04/2016   HDL 44.30 09/04/2016   LDLCALC 96 11/15/2015   LDLDIRECT 63.0 09/04/2016   TRIG (H) 09/04/2016    518.0 Triglyceride is over 400; calculations on Lipids are invalid.   CHOLHDL 4 09/04/2016           Hypertension: Has had this for several years, taking lisinopril HCTZ for this, no recent dosage adjustment made   BP Readings from Last 3 Encounters:  10/22/16 128/82  09/04/16 128/81  07/03/16 (!) 150/80    Most recent eye exam was 1/18  Most recent foot exam:    LABS:  No visits with results within 1 Week(s) from this visit.  Latest known visit with results is:  Office Visit on 09/04/2016  Component Date Value Ref Range Status  . HM Diabetic Eye Exam 08/14/2016 No Retinopathy  No Retinopathy Final  . Hgb A1c MFr Bld 09/04/2016 9.2* 4.6 - 6.5 % Final  . Cholesterol 09/04/2016 173  0 - 200 mg/dL Final  . Triglycerides 09/04/2016 518.0 Triglyceride is over 400; calculations on Lipids are invalid.* 0.0 - 149.0 mg/dL Final  . HDL 09/04/2016 44.30  >39.00 mg/dL Final  . Total CHOL/HDL Ratio 09/04/2016 4   Final  . Sodium 09/04/2016 133* 135 - 145 mEq/L Final  . Potassium 09/04/2016 4.2  3.5 - 5.1 mEq/L Final  . Chloride 09/04/2016 97  96 - 112 mEq/L Final  . CO2 09/04/2016 30  19 - 32 mEq/L Final  . Glucose, Bld 09/04/2016 208* 70 - 99 mg/dL Final  . BUN 09/04/2016 13  6 - 23 mg/dL  Final  . Creatinine, Ser 09/04/2016 0.73  0.40 - 1.50 mg/dL Final  . Calcium 09/04/2016 9.7  8.4 - 10.5 mg/dL Final  . GFR 09/04/2016 117.86  >60.00 mL/min Final  . TSH 09/04/2016 0.86  0.35 - 4.50 uIU/mL Final  . Total Bilirubin 09/04/2016 0.6  0.2 - 1.2 mg/dL Final  . Bilirubin, Direct 09/04/2016 0.1  0.0 - 0.3 mg/dL Final  . Alkaline Phosphatase 09/04/2016 71  39 - 117 U/L Final  . AST 09/04/2016 30  0 - 37 U/L Final  . ALT 09/04/2016 38  0 - 53 U/L Final  . Total Protein 09/04/2016 7.4  6.0 - 8.3 g/dL Final  . Albumin 09/04/2016 4.3  3.5 - 5.2 g/dL Final  . WBC 09/04/2016 9.0  4.0 - 10.5 K/uL Final  . RBC 09/04/2016 4.97  4.22 - 5.81 Mil/uL Final  . Hemoglobin 09/04/2016 15.8  13.0 - 17.0 g/dL Final  . HCT 09/04/2016 45.9  39.0 - 52.0 % Final  . MCV 09/04/2016 92.4  78.0 - 100.0 fl Final  . MCHC 09/04/2016 34.3  30.0 - 36.0 g/dL Final  . RDW 09/04/2016 12.2  11.5 - 15.5 % Final  . Platelets 09/04/2016 321.0  150.0 - 400.0 K/uL Final  . Neutrophils Relative % 09/04/2016 57.0  43.0 - 77.0 % Final  . Lymphocytes Relative 09/04/2016 31.2  12.0 - 46.0 % Final  . Monocytes Relative 09/04/2016 6.9  3.0 - 12.0 % Final  . Eosinophils Relative 09/04/2016 4.5  0.0 -  5.0 % Final  . Basophils Relative 09/04/2016 0.4  0.0 - 3.0 % Final  . Neutro Abs 09/04/2016 5.1  1.4 - 7.7 K/uL Final  . Lymphs Abs 09/04/2016 2.8  0.7 - 4.0 K/uL Final  . Monocytes Absolute 09/04/2016 0.6  0.1 - 1.0 K/uL Final  . Eosinophils Absolute 09/04/2016 0.4  0.0 - 0.7 K/uL Final  . Basophils Absolute 09/04/2016 0.0  0.0 - 0.1 K/uL Final  . Direct LDL 09/04/2016 63.0  mg/dL Final    Physical Examination:  BP 128/82 (Cuff Size: Normal)   Pulse 90   Ht 5' 10.5" (1.791 m)   Wt 223 lb (101.2 kg)   BMI 31.54 kg/m   GENERAL:         Patient has mild abdominal obesity HEENT:         Eye exam shows normal external appearance. Fundus exam shows no retinopathy.  Oral exam shows normal mucosa .  NECK:   There is no  lymphadenopathy Thyroid is not enlarged and no nodules felt.  Carotids are normal to palpation and no bruit heard LUNGS:         Chest is symmetrical. Lungs are clear to auscultation.Marland Kitchen   HEART:         Heart sounds:  S1 and S2 are normal. No murmur or click heard., no S3 or S4.   ABDOMEN:   There is no distention present. Liver and spleen are not palpable.  No other mass or tenderness present.   NEUROLOGICAL:   Ankle jerks are normal bilaterally.    Diabetic Foot Exam - Simple   Simple Foot Form Diabetic Foot exam was performed with the following findings:  Yes 10/22/2016 11:38 AM  Visual Inspection No deformities, no ulcerations, no other skin breakdown bilaterally:  Yes Sensation Testing Intact to touch and monofilament testing bilaterally:  Yes Pulse Check Posterior Tibialis and Dorsalis pulse intact bilaterally:  Yes Comments            Vibration sense is Moderately reduced in distal first toes. MUSCULOSKELETAL:  There is no swelling or deformity of the peripheral joints.   EXTREMITIES:     There is no edema. No skin lesions present.Marland Kitchen SKIN:       No rash or lesions of concern.        ASSESSMENT:  Diabetes type 2, uncontrolled    See history of present illness for detailed discussion of current diabetes management, blood sugar patterns and problems identified  His A1c was 9.2 about 6 weeks ago with taking a regimen of metformin only He has had some improvement in his blood sugars with starting Jardiance 10 mg but also he is on his own taking glipizide 2.5 mg twice a day He still has increased blood sugars averaging about 175 fasting and most likely still has some high readings after supper which he is not monitoring much Currently can do much better with his lifestyle with regular exercise and switching to lower fat options with his meals and snacks Needs diabetes education   Complications of diabetes: Erectile dysfunction.  Need reassessment of urine microalbumin  History of  mixed hyperlipidemia with persistently high triglycerides, higher recently with poor diabetes control  Hypertension: Appears well-controlled, no change apparently with adding 10 mg Jardiance  PLAN:     Increase Jardiance to 2 tablets and then start taking 25 mg daily with breakfast  Discussed that it may potentially lower his blood pressure and will need to follow this  Stop glipizide as  it is not consistently controlling his sugars overnight and switch to the extended release glipizide starting with 2.5 mg daily for now  Start regular walking for exercise  Consultation with dietitian for meal planning  Start cutting back on high-fat meals and carbohydrates  Follow-up in 4 weeks  Check urine microalbumin when blood sugars are better controlled  Patient Instructions  Check blood sugars on waking up 3-4 x weekly   Also check blood sugars about 2 hours after a meal and do this after different meals by rotation  Recommended blood sugar levels on waking up is 90-130 and about 2 hours after meal is 130-160  Please bring your blood sugar monitor to each visit, thank you  Low fat meals  Walk daily  Jardiance 2 tabs at [place]      Consultation note has been sent to the referring physician  [facility] 10/22/2016, 12:42 PM   Note: This office note was prepared with Dragon voice recognition system technology. Any transcriptional errors that result from this process are unintentional.  Counseling time on subjects discussed above is over 50% of today's  60 minute visit

## 2016-10-22 ENCOUNTER — Ambulatory Visit (INDEPENDENT_AMBULATORY_CARE_PROVIDER_SITE_OTHER): Payer: 59 | Admitting: Endocrinology

## 2016-10-22 ENCOUNTER — Encounter: Payer: Self-pay | Admitting: Endocrinology

## 2016-10-22 VITALS — BP 128/82 | HR 90 | Ht 70.5 in | Wt 223.0 lb

## 2016-10-22 DIAGNOSIS — E782 Mixed hyperlipidemia: Secondary | ICD-10-CM

## 2016-10-22 DIAGNOSIS — E1165 Type 2 diabetes mellitus with hyperglycemia: Secondary | ICD-10-CM

## 2016-10-22 DIAGNOSIS — I10 Essential (primary) hypertension: Secondary | ICD-10-CM | POA: Diagnosis not present

## 2016-10-22 DIAGNOSIS — E119 Type 2 diabetes mellitus without complications: Secondary | ICD-10-CM

## 2016-10-22 MED ORDER — GLIPIZIDE ER 2.5 MG PO TB24: 2.5000 mg | ORAL_TABLET | Freq: Every day | ORAL | 1 refills | Status: DC

## 2016-10-22 MED ORDER — GLUCOSE BLOOD VI STRP: ORAL_STRIP | 12 refills | Status: DC

## 2016-10-22 MED ORDER — METFORMIN HCL 1000 MG PO TABS: 1000.0000 mg | ORAL_TABLET | Freq: Two times a day (BID) | ORAL | 0 refills | Status: DC

## 2016-10-22 NOTE — Patient Instructions (Signed)
Check blood sugars on waking up 3-4 x weekly   Also check blood sugars about 2 hours after a meal and do this after different meals by rotation  Recommended blood sugar levels on waking up is 90-130 and about 2 hours after meal is 130-160  Please bring your blood sugar monitor to each visit, thank you  Low fat meals  Walk daily  Jardiance 2 tabs at [facility]

## 2016-11-01 ENCOUNTER — Other Ambulatory Visit: Payer: Self-pay

## 2016-11-01 ENCOUNTER — Telehealth: Payer: Self-pay | Admitting: Endocrinology

## 2016-11-01 MED ORDER — EMPAGLIFLOZIN 25 MG PO TABS
25.0000 mg | ORAL_TABLET | Freq: Every day | ORAL | 3 refills | Status: DC
Start: 2016-11-01 — End: 2017-02-24

## 2016-11-01 NOTE — Telephone Encounter (Signed)
This has been ordered and changed in the patients chart

## 2016-11-01 NOTE — Telephone Encounter (Signed)
Patient stated Dr Dwyane Dee was supposed to change medication  empagliflozin (JARDIANCE) 10 MG TABS tablet 30 tablet   Dosage to 25 mg,   Please send to  [place], [place]. [phone] (Phone) [phone] (Fax)   He is completely out.

## 2016-11-19 ENCOUNTER — Encounter: Payer: Self-pay | Admitting: Dietician

## 2016-11-19 ENCOUNTER — Other Ambulatory Visit (INDEPENDENT_AMBULATORY_CARE_PROVIDER_SITE_OTHER): Payer: 59

## 2016-11-19 ENCOUNTER — Encounter: Payer: 59 | Attending: Endocrinology | Admitting: Dietician

## 2016-11-19 DIAGNOSIS — Z713 Dietary counseling and surveillance: Secondary | ICD-10-CM | POA: Diagnosis not present

## 2016-11-19 DIAGNOSIS — I10 Essential (primary) hypertension: Secondary | ICD-10-CM | POA: Insufficient documentation

## 2016-11-19 DIAGNOSIS — E1165 Type 2 diabetes mellitus with hyperglycemia: Secondary | ICD-10-CM | POA: Diagnosis not present

## 2016-11-19 DIAGNOSIS — Z85828 Personal history of other malignant neoplasm of skin: Secondary | ICD-10-CM | POA: Diagnosis not present

## 2016-11-19 DIAGNOSIS — E785 Hyperlipidemia, unspecified: Secondary | ICD-10-CM | POA: Diagnosis not present

## 2016-11-19 DIAGNOSIS — E119 Type 2 diabetes mellitus without complications: Secondary | ICD-10-CM

## 2016-11-19 LAB — BASIC METABOLIC PANEL
BUN: 16 mg/dL (ref 6–23)
CHLORIDE: 98 meq/L (ref 96–112)
CO2: 31 meq/L (ref 19–32)
CREATININE: 0.98 mg/dL (ref 0.40–1.50)
Calcium: 10.8 mg/dL — ABNORMAL HIGH (ref 8.4–10.5)
GFR: 83.84 mL/min (ref >60.00)
GLUCOSE: 121 mg/dL — AB (ref 70–99)
POTASSIUM: 4.3 meq/L (ref 3.5–5.1)
Sodium: 136 mEq/L (ref 135–145)

## 2016-11-19 NOTE — Patient Instructions (Signed)
Start an active lifestyle.  Aim 30 minutes most days. How can you reduce stress? Be mindful of the sugar in your coffee. Balance your carbohydrates throughout the day. Breakfast, lunch, snack and dinner daily. Consider lower fat choices when eating out, to go box at the beginning of the meal or other tips to watch portion size.  Aim for 3-4 Carb Choices per meal (45-60 grams) +/- 1 either way  Aim for 0-2 Carbs per snack if hungry  Include protein in moderation with your meals and snacks Consider reading food labels for Total Carbohydrate and Fat Grams of foods Continue checking BG at alternate times per day as directed by MD  Continue taking medication as directed by MD

## 2016-11-19 NOTE — Progress Notes (Signed)
Diabetes Self-Management Education  Visit Type: First/Initial  Appt. Start Time: 0805 Appt. End Time: 9924  11/19/2016  Mr. Kevin Barnes, identified by name and date of birth, is a 56 y.o. male with a diagnosis of Diabetes: Type 2. Other hx includes HTN, hyperlipidemia, and skin cancer.  Medications include Jardiance, glipizide, Metformin. He reports weight loss when he first was diagnosed and did not notice a difference in his blood sugar.  His meter broke and then he stopped checking his blood sugar for a while as well and eating whatever he wanted.  He now is motivated to learn, change habits, and is checking his blood sugar.  Patient lives with his wife and 76 yo son.  They eat out often.  He is a Counsellor for [employer].   ASSESSMENT  Height 5\' 10"  (1.778 m), weight 223 lb (101.2 kg). Body mass index is 32 kg/m.  Reports that he lost from 235 lbs to 223 lbs since December.      Diabetes Self-Management Education - 11/19/16 2683      Visit Information   Visit Type First/Initial     Initial Visit   Diabetes Type Type 2   Are you currently following a meal plan? No   Are you taking your medications as prescribed? Yes   Date Diagnosed 2013     Health Coping   How would you rate your overall health? Good     Psychosocial Assessment   Patient Belief/Attitude about Diabetes Motivated to manage diabetes   Self-care barriers None   Self-management support Doctor's office;Family   Other persons present Patient   Patient Concerns Nutrition/Meal planning;Glycemic Control;Healthy Lifestyle   Special Needs None   Preferred Learning Style No preference indicated   Learning Readiness Ready   How often do you need to have someone help you when you read instructions, pamphlets, or other written materials from your doctor or pharmacy? 1 - Never   What is the last grade level you completed in school? 1 year college     Pre-Education Assessment   Patient understands the  diabetes disease and treatment process. Needs Review   Patient understands incorporating nutritional management into lifestyle. Needs Review   Patient undertands incorporating physical activity into lifestyle. Needs Review   Patient understands using medications safely. Needs Review   Patient understands monitoring blood glucose, interpreting and using results Needs Review   Patient understands prevention, detection, and treatment of acute complications. Needs Review   Patient understands prevention, detection, and treatment of chronic complications. Needs Review   Patient understands how to develop strategies to address psychosocial issues. Needs Review   Patient understands how to develop strategies to promote health/change behavior. Needs Review     Complications   Last HgB A1C per patient/outside source 9.2 %  09/04/16   How often do you check your blood sugar? 3-4 times/day   Fasting Blood glucose range (mg/dL) 130-179   Postprandial Blood glucose range (mg/dL) 180-200   Number of hypoglycemic episodes per month 0   Number of hyperglycemic episodes per week 1   Can you tell when your blood sugar is high? No   Have you had a dilated eye exam in the past 12 months? Yes   Have you had a dental exam in the past 12 months? No   Are you checking your feet? Yes   How many days per week are you checking your feet? 1     Dietary Intake   Breakfast coffee with  3 heaping tsp sugar and regular french vanilla creamer, steak, egg, and cheese biscuit OR spicy chicken and honey on biscuit or on workdays homemade Sausage balls  6 am on work days, 9:30 on days off   Snack (morning) none   Lunch something from vending machine (sandwich or crackers) OR occasional leftovers from restaurant  11   Snack (afternoon) chips  3   Dinner Eats out most of the time work days (cooks Monday or Tuesdays- Beef stroganoff, manwiches, meatloaf, or pasta) AND cookies or rice krispie treat or toast with peanut butter  and milk often  7   Snack (evening) rare   Beverage(s)       Exercise   Exercise Type ADL's  just go a dog   How many days per week to you exercise? 0   How many minutes per day do you exercise? 0   Total minutes per week of exercise 0     Patient Education   Previous Diabetes Education No   Disease state  Definition of diabetes, type 1 and 2, and the diagnosis of diabetes   Nutrition management  Role of diet in the treatment of diabetes and the relationship between the three main macronutrients and blood glucose level;Food label reading, portion sizes and measuring food.;Meal options for control of blood glucose level and chronic complications.;Information on hints to eating out and maintain blood glucose control.;Reviewed blood glucose goals for pre and post meals and how to evaluate the patients' food intake on their blood glucose level.   Physical activity and exercise  Role of exercise on diabetes management, blood pressure control and cardiac health.   Medications Reviewed patients medication for diabetes, action, purpose, timing of dose and side effects.   Monitoring Purpose and frequency of SMBG.;Identified appropriate SMBG and/or A1C goals.;Daily foot exams;Yearly dilated eye exam   Chronic complications Relationship between chronic complications and blood glucose control;Assessed and discussed foot care and prevention of foot problems;Retinopathy and reason for yearly dilated eye exams   Psychosocial adjustment Worked with patient to identify barriers to care and solutions;Role of stress on diabetes;Identified and addressed patients feelings and concerns about diabetes;Brainstormed with patient on coping mechanisms for social situations, getting support from significant others, dealing with feelings about diabetes   Personal strategies to promote health Lifestyle issues that need to be addressed for better diabetes care     Individualized Goals (developed by patient)   Nutrition  General guidelines for healthy choices and portions discussed   Physical Activity Exercise 3-5 times per week;30 minutes per day   Medications take my medication as prescribed   Monitoring  test my blood glucose as discussed   Problem Solving meal/snack options at work   Reducing Risk examine blood glucose patterns;do foot checks daily   Health Coping discuss diabetes with (comment)  MD,RD     Post-Education Assessment   Patient understands the diabetes disease and treatment process. Demonstrates understanding / competency   Patient understands incorporating nutritional management into lifestyle. Demonstrates understanding / competency   Patient undertands incorporating physical activity into lifestyle. Demonstrates understanding / competency   Patient understands using medications safely. Demonstrates understanding / competency   Patient understands monitoring blood glucose, interpreting and using results Demonstrates understanding / competency   Patient understands prevention, detection, and treatment of acute complications. Needs Instruction   Patient understands prevention, detection, and treatment of chronic complications. Demonstrates understanding / competency   Patient understands how to develop strategies to address psychosocial issues. Demonstrates understanding /  competency   Patient understands how to develop strategies to promote health/change behavior. Demonstrates understanding / competency     Outcomes   Expected Outcomes Demonstrated interest in learning. Expect positive outcomes   Future DMSE PRN   Program Status Completed      Individualized Plan for Diabetes Self-Management Training:   Learning Objective:  Patient will have a greater understanding of diabetes self-management. Patient education plan is to attend individual and/or group sessions per assessed needs and concerns.   Plan:   Patient Instructions  Start an active lifestyle.  Aim 30 minutes most  days. How can you reduce stress? Be mindful of the sugar in your coffee. Balance your carbohydrates throughout the day. Breakfast, lunch, snack and dinner daily. Consider lower fat choices when eating out, to go box at the beginning of the meal or other tips to watch portion size.  Aim for 3-4 Carb Choices per meal (45-60 grams) +/- 1 either way  Aim for 0-2 Carbs per snack if hungry  Include protein in moderation with your meals and snacks Consider reading food labels for Total Carbohydrate and Fat Grams of foods Continue checking BG at alternate times per day as directed by MD  Continue taking medication as directed by MD      Expected Outcomes:  Demonstrated interest in learning. Expect positive outcomes  Education material provided: Living Well with Diabetes, Food label handouts, A1C conversion sheet, Meal plan card, My Plate and Snack sheet  If problems or questions, patient to contact team via:  Phone  Future DSME appointment: PRN

## 2016-11-20 ENCOUNTER — Other Ambulatory Visit: Payer: 59

## 2016-11-20 LAB — FRUCTOSAMINE: Fructosamine: 295 umol/L — ABNORMAL HIGH (ref 0–285)

## 2016-12-03 ENCOUNTER — Ambulatory Visit (INDEPENDENT_AMBULATORY_CARE_PROVIDER_SITE_OTHER): Payer: 59 | Admitting: Endocrinology

## 2016-12-03 ENCOUNTER — Encounter: Payer: Self-pay | Admitting: Endocrinology

## 2016-12-03 DIAGNOSIS — E1165 Type 2 diabetes mellitus with hyperglycemia: Secondary | ICD-10-CM | POA: Diagnosis not present

## 2016-12-03 LAB — MICROALBUMIN / CREATININE URINE RATIO
Creatinine,U: 76.7 mg/dL
MICROALB UR: 2.8 mg/dL — AB (ref 0.0–1.9)
MICROALB/CREAT RATIO: 3.6 mg/g (ref 0.0–30.0)

## 2016-12-03 NOTE — Progress Notes (Signed)
Patient ID: Kevin Barnes, male   DOB: [birth year], 56 y.o.   MRN: [id number]            Reason for Appointment: Follow-up for Type 2 Diabetes  Referring physician: Birdie Riddle   History of Present Illness:          Date of diagnosis of type 2 diabetes mellitus:  2013      Background history:  He was diagnosed to have diabetes with a glucose of 132 in 2013 and baseline A1c of 6.4 He was started on metformin in 01/2013 when blood sugars were higher His blood sugars were reasonably controlled in the first 2 years or so, he was also given glipizide initially in 2015 but this caused hypoglycemia and he did not continue this. He had been followed up relatively sporadically He was also tried on Januvia subsequently in 2017 when his blood sugars were higher but this was changed to [place]  He did not take this because of the cost   Recent history:     A1c baseline in February was 9.2, fructosamine is now mildly increased at 295   Non-insulin hypoglycemic drugs the patient is taking are: Glipizide 2.5 mg twice a day, Jardiance 25 mg daily, Metformin 1g bid  Current management, blood sugar patterns and problems identified:  On his last visit he was changed from 10 mg Jardiance up to 25 mg which he prefers to take at night  Also he was changed from glipizide 2.5 twice a day to the extended-release glipizide ER 2.5 mg daily  He has seen the dietitian and is going to start making changes including getting more cooked meals and better choices when eating out in the evenings  He has done some walking with his dog no programmed exercise  Also has gained a couple of pounds from his recent vacation and may be getting more restaurant foods and beer when out of town  FASTING blood sugars are significantly better compared to last him when they were averaging 175  However has not checked readings after supper        Side effects from medications have : None, tries to take metformin with  food  Compliance with the medical regimen: Inconsistent Hypoglycemia:   History as he feels low sugars if the blood sugar is <110  Glucose monitoring:  done 1 times a day         Glucometer: One Touch Verio .       Blood Glucose readings by time of day and averages from meter download:  Mean values apply above for all meters except median for One Touch  PRE-MEAL Fasting Lunch Dinner Bedtime Overall  Glucose range: 100-160  72-205  101-157     Mean/median: 129 121    126    Self-care: The diet that the patient has been following is: tries to limit Drinks with sugar .     Meal times are:  Breakfast is at 7 am Lunch: Dinner: 7 pm    Typical meal intake: Breakfast is usually skipped.  He will have a sandwich or biscuit late morning His snacks will be peanut butter crackers or chips. Usually avoiding regular soft drinks                Dietician visit, most recent: 4/30               Exercise:  walking   Weight history:  Wt Readings from Last 3 Encounters:  12/03/16 225 lb 9.6  oz (102.3 kg)  11/19/16 223 lb (101.2 kg)  10/22/16 223 lb (101.2 kg)    Glycemic control:   Lab Results  Component Value Date   HGBA1C 9.2 (H) 09/04/2016   HGBA1C 8.0 (H) 04/03/2016   HGBA1C 8.6 (H) 11/15/2015   Lab Results  Component Value Date   MICROALBUR 5.0 (H) 12/27/2014   LDLCALC 96 11/15/2015   CREATININE 0.98 11/19/2016   No results found for: MICRALBCREAT  Lab Results  Component Value Date   FRUCTOSAMINE 295 (H) 11/19/2016      Allergies as of 12/03/2016   No Known Allergies     Medication List       Accurate as of 12/03/16  9:42 AM. Always use your most recent med list.          aspirin 81 MG tablet Take 81 mg by mouth daily.   B-12 5000 MCG Subl Place under the tongue.   butalbital-acetaminophen-caffeine 50-325-40 MG tablet Commonly known as:  FIORICET, ESGIC Take 1 tablet by mouth daily as needed for headache.   cholecalciferol 1000 units tablet Commonly  known as:  VITAMIN D Take 2,000 Units by mouth daily.   empagliflozin 25 MG Tabs tablet Commonly known as:  JARDIANCE Take 25 mg by mouth daily.   fenofibrate 160 MG tablet Take 1 tablet (160 mg total) by mouth daily.   glipiZIDE 2.5 MG 24 hr tablet Commonly known as:  GLUCOTROL XL Take 1 tablet (2.5 mg total) by mouth daily with breakfast.   glucose blood test strip Commonly known as:  ONETOUCH VERIO Use one strip each time sugars are tested, pt checks sugars 1-2 times daily. Dx E11.9   lisinopril-hydrochlorothiazide 20-25 MG tablet Commonly known as:  PRINZIDE,ZESTORETIC TAKE ONE TABLET BY MOUTH ONCE DAILY   metFORMIN 1000 MG tablet Commonly known as:  GLUCOPHAGE Take 1 tablet (1,000 mg total) by mouth 2 (two) times daily with a meal.   Omega-3 Krill Oil 1000 MG Caps Take by mouth.   OVER THE COUNTER MEDICATION Sunny Mood and Curamin   sildenafil 50 MG tablet Commonly known as:  VIAGRA TAKE TWO TABLETS BY MOUTH AS NEEDED FOR ERECTILE DYSFUNCTION   simvastatin 20 MG tablet Commonly known as:  ZOCOR TAKE ONE TABLET BY MOUTH ONCE DAILY IN THE EVENING       Allergies: No Known Allergies  Past Medical History:  Diagnosis Date  . Allergy   . Basal cell carcinoma of cheek 2016  . Diabetes mellitus   . Hyperlipidemia   . Hypertension   . Obesity   . Obesity     Past Surgical History:  Procedure Laterality Date  . PROSTATE SURGERY      Family History  Problem Relation Age of Onset  . Cancer Father        Prostate  . Heart disease Father   . Hyperlipidemia Father   . Hypertension Father   . Stroke Paternal Grandfather   . Diabetes Neg Hx     Social History:  reports that he has quit smoking. He has never used smokeless tobacco. He reports that he drinks about 0.6 oz of alcohol per week . He reports that he does not use drugs.   Review of Systems  Constitutional:       Has lost 4 pounds recently  Eyes: Negative for visual disturbance.   Respiratory: Negative for daytime sleepiness and shortness of breath.   Cardiovascular: Negative for chest pain.  Gastrointestinal: Negative for diarrhea.  Endocrine: Positive for  erectile dysfunction. Negative for fatigue, general weakness and polydipsia.       Has been prescribed Viagra for ED  Genitourinary: Negative for nocturia and slow stream.  Musculoskeletal: Negative for joint pain.  Skin: Negative for rash.  Neurological: Negative for numbness and tingling.  Psychiatric/Behavioral: Negative for insomnia.     Lipid history: He has had persistently high triglyceride, now started fenofibrate in addition to his Lipitor    Lab Results  Component Value Date   CHOL 173 09/04/2016   HDL 44.30 09/04/2016   LDLCALC 96 11/15/2015   LDLDIRECT 63.0 09/04/2016   TRIG (H) 09/04/2016    518.0 Triglyceride is over 400; calculations on Lipids are invalid.   CHOLHDL 4 09/04/2016           Hypertension: Has had this for several years, taking lisinopril HCTZ for this, no recent dosage adjustment made   BP Readings from Last 3 Encounters:  12/03/16 128/80  10/22/16 128/82  09/04/16 128/81    Most recent eye exam was 1/18  Most recent foot exam:    LABS:  No visits with results within 1 Week(s) from this visit.  Latest known visit with results is:  Lab on 11/19/2016  Component Date Value Ref Range Status  . Sodium 11/19/2016 136  135 - 145 mEq/L Final  . Potassium 11/19/2016 4.3  3.5 - 5.1 mEq/L Final  . Chloride 11/19/2016 98  96 - 112 mEq/L Final  . CO2 11/19/2016 31  19 - 32 mEq/L Final  . Glucose, Bld 11/19/2016 121* 70 - 99 mg/dL Final  . BUN 11/19/2016 16  6 - 23 mg/dL Final  . Creatinine, Ser 11/19/2016 0.98  0.40 - 1.50 mg/dL Final  . Calcium 11/19/2016 10.8* 8.4 - 10.5 mg/dL Final  . GFR 11/19/2016 83.84  >60.00 mL/min Final  . Fructosamine 11/19/2016 295* 0 - 285 umol/L Final   Comment: Published reference interval for apparently healthy subjects between age  5 and 27 is 6 - 285 umol/L and in a poorly controlled diabetic population is 228 - 563 umol/L with a mean of 396 umol/L.     Physical Examination:  BP 128/80   Pulse 73   Ht 5\' 10"  (1.778 m)   Wt 225 lb 9.6 oz (102.3 kg)   SpO2 98%   BMI 32.37 kg/m        ASSESSMENT:  Diabetes type 2, uncontrolled    See history of present illness for detailed discussion of current diabetes management, blood sugar patterns and problems identified  His A1c was 9.2 previously with metformin only and now he is on Jardiance 25 along with glipizide ER 2.5 mg daily Previously had significantly high fasting readings were these are much better with both increasing the Jardiance and using extended release glipizide He does need to start working on his diet and exercise regimen which she has not done much  HYPERCALCEMIA: This is relatively new.  This may be transient or possibly related to using 25 [place], HCTZ   PLAN:     Increase exercise  Consistent meal planning as discussed with dietitian  Start monitoring after supper  Recheck calcium on the next visit, consider reducing dose of HCTZ  If calcium still high will check PTH   Patient Instructions  Check blood sugars on waking up  3x weekly  Also check blood sugars about 2 hours after a meal and do this after different meals by rotation  Recommended blood sugar levels on waking up is 90-130 and  about 2 hours after meal is 130-160  Please bring your blood sugar monitor to each visit, thank you       [facility] 12/03/2016, 9:42 AM   Note: This office note was prepared with Dragon voice recognition system technology. Any transcriptional errors that result from this process are unintentional.

## 2016-12-03 NOTE — Patient Instructions (Signed)
Check blood sugars on waking up  3x weekly  Also check blood sugars about 2 hours after a meal and do this after different meals by rotation  Recommended blood sugar levels on waking up is 90-130 and about 2 hours after meal is 130-160  Please bring your blood sugar monitor to each visit, thank you  

## 2016-12-18 ENCOUNTER — Other Ambulatory Visit: Payer: Self-pay | Admitting: Endocrinology

## 2017-01-08 ENCOUNTER — Encounter: Payer: Self-pay | Admitting: Physician Assistant

## 2017-01-08 ENCOUNTER — Ambulatory Visit (INDEPENDENT_AMBULATORY_CARE_PROVIDER_SITE_OTHER): Payer: 59 | Admitting: Physician Assistant

## 2017-01-08 VITALS — BP 120/72 | HR 75 | Temp 98.2°F | Resp 14 | Ht 71.0 in | Wt 225.0 lb

## 2017-01-08 DIAGNOSIS — L03116 Cellulitis of left lower limb: Secondary | ICD-10-CM

## 2017-01-08 MED ORDER — DOXYCYCLINE HYCLATE 100 MG PO CAPS: 100.0000 mg | ORAL_CAPSULE | Freq: Two times a day (BID) | ORAL | 0 refills | Status: DC

## 2017-01-08 NOTE — Progress Notes (Signed)
Patient presents to clinic today c/o sting on L foot from stingray 1 week ago while [place] the beach. Noted being stung twice by same ray. Noted initial significant pain, redness and swelling. Noted the bulk of his discomfort resolved in a couple of hours. States that swelling and redness recurred 3 days ago. Notes tenderness and hardness of skin. Denies drainage from site. Denies fever, chills, malaise or fatigue.  Past Medical History:  Diagnosis Date  . Allergy   . Basal cell carcinoma of cheek 2016  . Diabetes mellitus   . Hyperlipidemia   . Hypertension   . Obesity   . Obesity     Current Outpatient Prescriptions on File Prior to Visit  Medication Sig Dispense Refill  . aspirin 81 MG tablet Take 81 mg by mouth daily.    . butalbital-acetaminophen-caffeine (FIORICET, ESGIC) 50-325-40 MG tablet Take 1 tablet by mouth daily as needed for headache. 30 tablet 0  . cholecalciferol (VITAMIN D) 1000 units tablet Take 2,000 Units by mouth daily.    . Cyanocobalamin (B-12) 5000 MCG SUBL Place under the tongue.    . empagliflozin (JARDIANCE) 25 MG TABS tablet Take 25 mg by mouth daily. 30 tablet 3  . fenofibrate 160 MG tablet Take 1 tablet (160 mg total) by mouth daily. 30 tablet 6  . glipiZIDE (GLUCOTROL XL) 2.5 MG 24 hr tablet TAKE 1 TABLET BY MOUTH ONCE DAILY WITH BREAKFAST 30 tablet 1  . glucose blood (ONETOUCH VERIO) test strip Use one strip each time sugars are tested, pt checks sugars 1-2 times daily. Dx E11.9 100 each 12  . lisinopril-hydrochlorothiazide (PRINZIDE,ZESTORETIC) 20-25 MG tablet TAKE ONE TABLET BY MOUTH ONCE DAILY 90 tablet 1  . metFORMIN (GLUCOPHAGE) 1000 MG tablet Take 1 tablet (1,000 mg total) by mouth 2 (two) times daily with a meal. 60 tablet 0  . Omega-3 Krill Oil 1000 MG CAPS Take by mouth.    Marland Kitchen OVER THE COUNTER MEDICATION Sunny Mood and Curamin    . sildenafil (VIAGRA) 50 MG tablet TAKE TWO TABLETS BY MOUTH AS NEEDED FOR ERECTILE DYSFUNCTION 6 tablet 1  .  simvastatin (ZOCOR) 20 MG tablet TAKE ONE TABLET BY MOUTH ONCE DAILY IN THE EVENING 90 tablet 1   No current facility-administered medications on file prior to visit.     No Known Allergies  Family History  Problem Relation Age of Onset  . Cancer Father        Prostate  . Heart disease Father   . Hyperlipidemia Father   . Hypertension Father   . Stroke Paternal Grandfather   . Diabetes Neg Hx     Social History   Social History  . Marital status: Married    Spouse name: N/A  . Number of children: N/A  . Years of education: 60   Occupational History  . dispatcher [employer]   Social History Main Topics  . Smoking status: Former Research scientist (life sciences)  . Smokeless tobacco: Never Used  . Alcohol use 0.6 oz/week    1 Standard drinks or equivalent per week     Comment: 1 drink  . Drug use: No  . Sexual activity: Yes    Birth control/ protection: None   Other Topics Concern  . None   Social History Narrative  . None   Review of Systems - See HPI.  All other ROS are negative.  BP 120/72   Pulse 75   Temp 98.2 F (36.8 C) (Oral)   Resp 14  Ht 5\' 11"  (1.803 m)   Wt 225 lb (102.1 kg)   SpO2 97%   BMI 31.38 kg/m   Physical Exam  Constitutional: He is oriented to person, place, and time and well-developed, well-nourished, and in no distress.  HENT:  Head: Normocephalic and atraumatic.  Eyes: Conjunctivae are normal.  Neck: Neck supple.  Cardiovascular: Normal rate, regular rhythm, normal heart sounds and intact distal pulses.   Pulmonary/Chest: Effort normal and breath sounds normal. No respiratory distress. He has no wheezes. He has no rales. He exhibits no tenderness.  Neurological: He is alert and oriented to person, place, and time.  Skin: Skin is warm and dry.     Psychiatric: Affect normal.  Vitals reviewed.   Recent Results (from the past 2160 hour(s))  Basic metabolic panel     Status: Abnormal   Collection Time: 11/19/16  9:59 AM  Result Value Ref Range    Sodium 136 135 - 145 mEq/L   Potassium 4.3 3.5 - 5.1 mEq/L   Chloride 98 96 - 112 mEq/L   CO2 31 19 - 32 mEq/L   Glucose, Bld 121 (H) 70 - 99 mg/dL   BUN 16 6 - 23 mg/dL   Creatinine, Ser 0.98 0.40 - 1.50 mg/dL   Calcium 10.8 (H) 8.4 - 10.5 mg/dL   GFR 83.84 >60.00 mL/min  Fructosamine     Status: Abnormal   Collection Time: 11/19/16  9:59 AM  Result Value Ref Range   Fructosamine 295 (H) 0 - 285 umol/L    Comment: Published reference interval for apparently healthy subjects between age 38 and 14 is 31 - 285 umol/L and in a poorly controlled diabetic population is 228 - 563 umol/L with a mean of 396 umol/L.   Microalbumin / creatinine urine ratio     Status: Abnormal   Collection Time: 12/03/16  8:46 AM  Result Value Ref Range   Microalb, Ur 2.8 (H) 0.0 - 1.9 mg/dL   Creatinine,U 76.7 mg/dL   Microalb Creat Ratio 3.6 0.0 - 30.0 mg/g    Assessment/Plan: 1. Cellulitis of left lower extremity Start Doxycycline. Supportive measures reviewed. Strict ER precautions given.  Follow-up scheduled.  - doxycycline (VIBRAMYCIN) 100 MG capsule; Take 1 capsule (100 mg total) by mouth 2 (two) times daily.  Dispense: 14 capsule; Refill: 0   Leeanne Rio, [place]

## 2017-01-08 NOTE — Progress Notes (Signed)
Pre visit review using our clinic review tool, if applicable. No additional management support is needed unless otherwise documented below in the visit note. 

## 2017-01-08 NOTE — Patient Instructions (Signed)
Please take antibiotic as directed. Elevate leg while resting. There is sign of a mild local delayed allergic reaction as well -- Start a morning claritin and Benadryl in the evening.   If there are any worsening symptoms on the medications, you need to be seen at an ER.

## 2017-01-29 ENCOUNTER — Ambulatory Visit (INDEPENDENT_AMBULATORY_CARE_PROVIDER_SITE_OTHER): Payer: 59 | Admitting: Family Medicine

## 2017-01-29 ENCOUNTER — Encounter: Payer: Self-pay | Admitting: Family Medicine

## 2017-01-29 DIAGNOSIS — Z0000 Encounter for general adult medical examination without abnormal findings: Secondary | ICD-10-CM | POA: Diagnosis not present

## 2017-01-29 DIAGNOSIS — R7989 Other specified abnormal findings of blood chemistry: Secondary | ICD-10-CM

## 2017-01-29 LAB — CBC WITH DIFFERENTIAL/PLATELET
BASOS ABS: 0.1 10*3/uL (ref 0.0–0.1)
BASOS PCT: 1.1 % (ref 0.0–3.0)
EOS ABS: 0.3 10*3/uL (ref 0.0–0.7)
Eosinophils Relative: 3.4 % (ref 0.0–5.0)
HEMATOCRIT: 44.7 % (ref 39.0–52.0)
HEMOGLOBIN: 15.6 g/dL (ref 13.0–17.0)
LYMPHS PCT: 33.5 % (ref 12.0–46.0)
Lymphs Abs: 2.7 10*3/uL (ref 0.7–4.0)
MCHC: 35 g/dL (ref 30.0–36.0)
MCV: 91.5 fl (ref 78.0–100.0)
Monocytes Absolute: 0.6 10*3/uL (ref 0.1–1.0)
Monocytes Relative: 7.8 % (ref 3.0–12.0)
Neutro Abs: 4.3 10*3/uL (ref 1.4–7.7)
Neutrophils Relative %: 54.2 % (ref 43.0–77.0)
Platelets: 379 10*3/uL (ref 150.0–400.0)
RBC: 4.89 Mil/uL (ref 4.22–5.81)
RDW: 12.4 % (ref 11.5–15.5)
WBC: 7.9 10*3/uL (ref 4.0–10.5)

## 2017-01-29 LAB — HEPATIC FUNCTION PANEL
ALBUMIN: 4.6 g/dL (ref 3.5–5.2)
ALT: 30 U/L (ref 0–53)
AST: 23 U/L (ref 0–37)
Alkaline Phosphatase: 45 U/L (ref 39–117)
BILIRUBIN TOTAL: 0.6 mg/dL (ref 0.2–1.2)
Bilirubin, Direct: 0.1 mg/dL (ref 0.0–0.3)
TOTAL PROTEIN: 7.8 g/dL (ref 6.0–8.3)

## 2017-01-29 LAB — PSA: PSA: 2.88 ng/mL (ref 0.10–4.00)

## 2017-01-29 LAB — LIPID PANEL
CHOL/HDL RATIO: 4
Cholesterol: 185 mg/dL (ref 0–200)
HDL: 51.7 mg/dL (ref >39.00)
NonHDL: 133.52
Triglycerides: 218 mg/dL — ABNORMAL HIGH (ref 0.0–149.0)
VLDL: 43.6 mg/dL — AB (ref 0.0–40.0)

## 2017-01-29 LAB — BASIC METABOLIC PANEL
BUN: 20 mg/dL (ref 6–23)
CO2: 30 mEq/L (ref 19–32)
Calcium: 10.5 mg/dL (ref 8.4–10.5)
Chloride: 95 mEq/L — ABNORMAL LOW (ref 96–112)
Creatinine, Ser: 1.1 mg/dL (ref 0.40–1.50)
GFR: 73.33 mL/min (ref >60.00)
Glucose, Bld: 129 mg/dL — ABNORMAL HIGH (ref 70–99)
POTASSIUM: 4.5 meq/L (ref 3.5–5.1)
SODIUM: 134 meq/L — AB (ref 135–145)

## 2017-01-29 LAB — TSH: TSH: 0.93 u[IU]/mL (ref 0.35–4.50)

## 2017-01-29 LAB — LDL CHOLESTEROL, DIRECT: LDL DIRECT: 109 mg/dL

## 2017-01-29 NOTE — Progress Notes (Signed)
Pre visit review using our clinic review tool, if applicable. No additional management support is needed unless otherwise documented below in the visit note. 

## 2017-01-29 NOTE — Assessment & Plan Note (Signed)
Pt's PE WNL w/ exception of being overweight.  UTD on colonoscopy, Tdap.  Pt has appt upcoming w/ Endo and Urology- care team updated.  EKG done as baseline- see report.  Check labs.  Anticipatory guidance provided.

## 2017-01-29 NOTE — Progress Notes (Signed)
   Subjective:    Patient ID: Kevin Barnes, male    DOB: Apr 16, 1961, 56 y.o.   MRN: [id number]  HPI CPE- UTD on colonoscopy, Tdap.  Following w/ Dr Dwyane Dee for DM.   Review of Systems Patient reports no vision/hearing changes, anorexia, fever ,adenopathy, persistant/recurrent hoarseness, swallowing issues, chest pain, palpitations, edema, persistant/recurrent cough, hemoptysis, dyspnea (rest,exertional, paroxysmal nocturnal), gastrointestinal  bleeding (melena, rectal bleeding), abdominal pain, excessive heart burn, GU symptoms (dysuria, hematuria, voiding/incontinence issues) syncope, focal weakness, memory loss, numbness & tingling, skin/hair/nail changes, depression, anxiety, abnormal bruising/bleeding, musculoskeletal symptoms/signs.     Objective:   Physical Exam General Appearance:    Alert, cooperative, no distress, appears stated age  Head:    Normocephalic, without obvious abnormality, atraumatic  Eyes:    PERRL, conjunctiva/corneas clear, EOM's intact, fundi    benign, both eyes       Ears:    Normal TM's and external ear canals, both ears  Nose:   Nares normal, septum midline, mucosa normal, no drainage   or sinus tenderness  Throat:   Lips, mucosa, and tongue normal; teeth and gums normal  Neck:   Supple, symmetrical, trachea midline, no adenopathy;       thyroid:  No enlargement/tenderness/nodules  Back:     Symmetric, no curvature, ROM normal, no CVA tenderness  Lungs:     Clear to auscultation bilaterally, respirations unlabored  Chest wall:    No tenderness or deformity  Heart:    Regular rate and rhythm, S1 and S2 normal, no murmur, rub   or gallop  Abdomen:     Soft, non-tender, bowel sounds active all four quadrants,    no masses, no organomegaly  Genitalia:    Deferred to urology  Rectal:    Extremities:   Extremities normal, atraumatic, no cyanosis or edema  Pulses:   2+ and symmetric all extremities  Skin:   Skin color, texture, turgor normal, no rashes or  lesions  Lymph nodes:   Cervical, supraclavicular, and axillary nodes normal  Neurologic:   CNII-XII intact. Normal strength, sensation and reflexes      throughout          Assessment & Plan:

## 2017-01-29 NOTE — Patient Instructions (Signed)
Follow up in 6 months to recheck BP and cholesterol We'll notify you of your lab results and make any changes if needed Continue to work on healthy diet and regular exercise- you're doing great! Call with any questions or concerns Have a great summer!! 

## 2017-01-30 ENCOUNTER — Encounter: Payer: Self-pay | Admitting: General Practice

## 2017-02-10 ENCOUNTER — Other Ambulatory Visit: Payer: Self-pay | Admitting: Endocrinology

## 2017-02-10 DIAGNOSIS — E119 Type 2 diabetes mellitus without complications: Secondary | ICD-10-CM

## 2017-02-18 ENCOUNTER — Other Ambulatory Visit: Payer: Self-pay | Admitting: Endocrinology

## 2017-02-24 ENCOUNTER — Other Ambulatory Visit: Payer: Self-pay | Admitting: Endocrinology

## 2017-02-26 ENCOUNTER — Other Ambulatory Visit (INDEPENDENT_AMBULATORY_CARE_PROVIDER_SITE_OTHER): Payer: 59

## 2017-02-26 DIAGNOSIS — E1165 Type 2 diabetes mellitus with hyperglycemia: Secondary | ICD-10-CM

## 2017-02-26 LAB — HEMOGLOBIN A1C: HEMOGLOBIN A1C: 6.6 % — AB (ref 4.6–6.5)

## 2017-02-26 LAB — COMPREHENSIVE METABOLIC PANEL
ALT: 27 U/L (ref 0–53)
AST: 19 U/L (ref 0–37)
Albumin: 4.3 g/dL (ref 3.5–5.2)
Alkaline Phosphatase: 40 U/L (ref 39–117)
BILIRUBIN TOTAL: 0.4 mg/dL (ref 0.2–1.2)
BUN: 21 mg/dL (ref 6–23)
CHLORIDE: 98 meq/L (ref 96–112)
CO2: 29 meq/L (ref 19–32)
Calcium: 10.1 mg/dL (ref 8.4–10.5)
Creatinine, Ser: 1.12 mg/dL (ref 0.40–1.50)
GFR: 71.8 mL/min (ref >60.00)
Glucose, Bld: 144 mg/dL — ABNORMAL HIGH (ref 70–99)
POTASSIUM: 3.5 meq/L (ref 3.5–5.1)
Sodium: 137 mEq/L (ref 135–145)
Total Protein: 7.7 g/dL (ref 6.0–8.3)

## 2017-03-04 NOTE — Progress Notes (Signed)
Patient ID: Kevin Barnes, male   DOB: 02-25-1961, 56 y.o.   MRN: [id number]            Reason for Appointment: Follow-up for Type 2 Diabetes  Referring physician: Birdie Riddle   History of Present Illness:          Date of diagnosis of type 2 diabetes mellitus:  2013      Background history:  He was diagnosed to have diabetes with a glucose of 132 in 2013 and baseline A1c of 6.4 He was started on metformin in 01/2013 when blood sugars were higher His blood sugars were reasonably controlled in the first 2 years or so, he was also given glipizide initially in 2015 but this caused hypoglycemia and he did not continue this. He had been followed up relatively sporadically He was also tried on Januvia subsequently in 2017 when his blood sugars were higher but this was changed to [place]  He did not take this because of the cost   Recent history:     A1c baseline in February was 9.2, this is now 6.6  Non-insulin hypoglycemic drugs the patient is taking are: Glipizide 2.5 mg ER, Jardiance 25 mg daily, Metformin 1g bid  Current management, blood sugar patterns and problems identified:  On his last visit he was advised to check more readings after evening meal but he still has not done so  Not clear why his fasting readings are higher.any change in medications on his last visit, previously his morning sugars were averaging 129  He does not think he is consistent with his diet at times although he has been instructed by dietitian  Also that sugars may be variable when on medication  He has done a little walking but not much, does have a treadmill available also  Blood sugars are excellent in the daytime but check before meals  He had an episode of hypoglycemia when traveling and skipping lunch        Side effects from medications have : None, tries to take metformin with food  Compliance with the medical regimen: Inconsistent Hypoglycemia:   History as he feels low sugars if the blood  sugar is <110  Glucose monitoring:  done 1 times a day         Glucometer: One Touch Verio .       Blood Glucose readings by time of day and averages from meter download:  Mean values apply above for all meters except median for One Touch  PRE-MEAL Fasting Lunch Dinner Bedtime Overall  Glucose range: 107-166       Mean/median: 148 110  105   127     Self-care: The diet that the patient has been following is: tries to limit Drinks with sugar .     Meal times are:  Breakfast is at 7 am Lunch: Dinner: 7 pm    Typical meal intake: Breakfast is usually skipped.  He will have a sandwich or biscuit late morning His snacks will be peanut butter crackers or chips. He is avoiding regular soft drinks                Dietician visit, most recent: 11/19/16               Exercise: not walking much  Weight history:  Wt Readings from Last 3 Encounters:  03/05/17 223 lb 6.4 oz (101.3 kg)  01/29/17 224 lb 2 oz (101.7 kg)  01/08/17 225 lb (102.1 kg)  Glycemic control:   Lab Results  Component Value Date   HGBA1C 6.6 (H) 02/26/2017   HGBA1C 9.2 (H) 09/04/2016   HGBA1C 8.0 (H) 04/03/2016   Lab Results  Component Value Date   MICROALBUR 2.8 (H) 12/03/2016   LDLCALC 96 11/15/2015   CREATININE 1.12 02/26/2017   Lab Results  Component Value Date   MICRALBCREAT 3.6 12/03/2016    Lab Results  Component Value Date   FRUCTOSAMINE 295 (H) 11/19/2016      Allergies as of 03/05/2017   No Known Allergies     Medication List       Accurate as of 03/05/17  9:28 AM. Always use your most recent med list.          aspirin 81 MG tablet Take 81 mg by mouth daily.   B-12 5000 MCG Subl Place under the tongue.   butalbital-acetaminophen-caffeine 50-325-40 MG tablet Commonly known as:  FIORICET, ESGIC Take 1 tablet by mouth daily as needed for headache.   cholecalciferol 1000 units tablet Commonly known as:  VITAMIN D Take 2,000 Units by mouth daily.   fenofibrate 160 MG  tablet Take 1 tablet (160 mg total) by mouth daily.   glimepiride 1 MG tablet Commonly known as:  AMARYL Take 1 tablet (1 mg total) by mouth daily before supper.   glucose blood test strip Commonly known as:  ONETOUCH VERIO Use one strip each time sugars are tested, pt checks sugars 1-2 times daily. Dx E11.9   JARDIANCE 25 MG Tabs tablet Generic drug:  empagliflozin TAKE 1 TABLET BY MOUTH ONCE DAILY   lisinopril-hydrochlorothiazide 20-25 MG tablet Commonly known as:  PRINZIDE,ZESTORETIC TAKE ONE TABLET BY MOUTH ONCE DAILY   metFORMIN 1000 MG tablet Commonly known as:  GLUCOPHAGE TAKE 1 TABLET BY MOUTH TWICE DAILY WITH MEALS   Omega-3 Krill Oil 1000 MG Caps Take by mouth.   OVER THE COUNTER MEDICATION Sunny Mood and Curamin   sildenafil 50 MG tablet Commonly known as:  VIAGRA TAKE TWO TABLETS BY MOUTH AS NEEDED FOR ERECTILE DYSFUNCTION   simvastatin 20 MG tablet Commonly known as:  ZOCOR TAKE ONE TABLET BY MOUTH ONCE DAILY IN THE EVENING       Allergies: No Known Allergies  Past Medical History:  Diagnosis Date  . Allergy   . Basal cell carcinoma of cheek 2016  . Diabetes mellitus   . Hyperlipidemia   . Hypertension   . Obesity   . Obesity     Past Surgical History:  Procedure Laterality Date  . PROSTATE SURGERY      Family History  Problem Relation Age of Onset  . Cancer Father        Prostate  . Heart disease Father   . Hyperlipidemia Father   . Hypertension Father   . Stroke Paternal Grandfather   . Diabetes Neg Hx     Social History:  reports that he has quit smoking. He has never used smokeless tobacco. He reports that he drinks about 0.6 oz of alcohol per week . He reports that he does not use drugs.   Review of Systems     Lipid history: He has had persistently high triglyceride, started fenofibrate in addition to his Lipitor    Lab Results  Component Value Date   CHOL 185 01/29/2017   HDL 51.70 01/29/2017   LDLCALC 96  11/15/2015   LDLDIRECT 109.0 01/29/2017   TRIG 218.0 (H) 01/29/2017   CHOLHDL 4 01/29/2017  Hypertension: Has had this for several years, taking lisinopril HCTZ    BP Readings from Last 3 Encounters:  03/05/17 130/72  01/29/17 112/70  01/08/17 120/72   Hypercalcemia: His calcium was high on his initial visit, now better at 10.1  Most recent eye exam was 1/18  Most recent foot exam:4/18    LABS:  No visits with results within 1 Week(s) from this visit.  Latest known visit with results is:  Lab on 02/26/2017  Component Date Value Ref Range Status  . Hgb A1c MFr Bld 02/26/2017 6.6* 4.6 - 6.5 % Final   Glycemic Control Guidelines for People with Diabetes:Non Diabetic:  <6%Goal of Therapy: <7%Additional Action Suggested:  >8%   . Sodium 02/26/2017 137  135 - 145 mEq/L Final  . Potassium 02/26/2017 3.5  3.5 - 5.1 mEq/L Final  . Chloride 02/26/2017 98  96 - 112 mEq/L Final  . CO2 02/26/2017 29  19 - 32 mEq/L Final  . Glucose, Bld 02/26/2017 144* 70 - 99 mg/dL Final  . BUN 02/26/2017 21  6 - 23 mg/dL Final  . Creatinine, Ser 02/26/2017 1.12  0.40 - 1.50 mg/dL Final  . Total Bilirubin 02/26/2017 0.4  0.2 - 1.2 mg/dL Final  . Alkaline Phosphatase 02/26/2017 40  39 - 117 U/L Final  . AST 02/26/2017 19  0 - 37 U/L Final  . ALT 02/26/2017 27  0 - 53 U/L Final  . Total Protein 02/26/2017 7.7  6.0 - 8.3 g/dL Final  . Albumin 02/26/2017 4.3  3.5 - 5.2 g/dL Final  . Calcium 02/26/2017 10.1  8.4 - 10.5 mg/dL Final  . GFR 02/26/2017 71.80  >60.00 mL/min Final    Physical Examination:  BP 130/72   Pulse 90   Ht 5\' 11"  (1.803 m)   Wt 223 lb 6.4 oz (101.3 kg)   SpO2 97%   BMI 31.16 kg/m        ASSESSMENT:  Diabetes type 2, uncontrolled    See history of present illness for detailed discussion of current diabetes management, blood sugar patterns and problems identified  His A1c was 9.2 previously with metformin only and now With adding Jardiance 25 along with  glipizide ER 2.5 mg daily his A1c is down to 6.6 However his fasting blood sugars have started going up again Not clear if he is having high readings after evening meal His diet and exercise regimen can be better and this was discussed  Previously had significantly high fasting readings were these are much better with both increasing the Jardiance and using extended release glipizide He does need to start working on his diet and exercise regimen which he has not done much  HYPERCALCEMIA: Improved, may well have mild hyperparathyroidism However is also taking HCTZ   PLAN:     Increase exercise  Consistent diet as discussed with dietitian  Start monitoring blood sugars after supper/bedtime  Trial of Amaryl instead of glipizide ER, he can take 1 mg at suppertime and if morning sugars and bedtime readings are still high can increase it to 2 mg, advised them to call if he has any questions on this  A1c in 3 months  Patient Instructions  Check blood sugars on waking up  3-4/7  Also check blood sugars about 2 hours after a meal and do this after different meals by rotation  Recommended blood sugar levels on waking up is 90-130 and about 2 hours after meal is 130-160  Please bring your blood sugar monitor  to each visit, thank you  More walking       [facility] 03/05/2017, 9:28 AM   Note: This office note was prepared with Estate agent. Any transcriptional errors that result from this process are unintentional.

## 2017-03-05 ENCOUNTER — Ambulatory Visit (INDEPENDENT_AMBULATORY_CARE_PROVIDER_SITE_OTHER): Payer: 59 | Admitting: Endocrinology

## 2017-03-05 ENCOUNTER — Encounter: Payer: Self-pay | Admitting: Endocrinology

## 2017-03-05 VITALS — BP 130/72 | HR 90 | Ht 71.0 in | Wt 223.4 lb

## 2017-03-05 DIAGNOSIS — E1165 Type 2 diabetes mellitus with hyperglycemia: Secondary | ICD-10-CM | POA: Diagnosis not present

## 2017-03-05 MED ORDER — GLIMEPIRIDE 1 MG PO TABS: 1.0000 mg | ORAL_TABLET | Freq: Every day | ORAL | 3 refills | Status: DC

## 2017-03-05 NOTE — Patient Instructions (Addendum)
Check blood sugars on waking up  3-4/7  Also check blood sugars about 2 hours after a meal and do this after different meals by rotation  Recommended blood sugar levels on waking up is 90-130 and about 2 hours after meal is 130-160  Please bring your blood sugar monitor to each visit, thank you  More walking

## 2017-03-18 ENCOUNTER — Other Ambulatory Visit: Payer: Self-pay | Admitting: Family Medicine

## 2017-03-18 DIAGNOSIS — I10 Essential (primary) hypertension: Secondary | ICD-10-CM

## 2017-03-18 DIAGNOSIS — E119 Type 2 diabetes mellitus without complications: Secondary | ICD-10-CM

## 2017-04-01 ENCOUNTER — Other Ambulatory Visit: Payer: Self-pay | Admitting: Family Medicine

## 2017-04-01 DIAGNOSIS — E789 Disorder of lipoprotein metabolism, unspecified: Secondary | ICD-10-CM

## 2017-04-01 DIAGNOSIS — Z7189 Other specified counseling: Secondary | ICD-10-CM

## 2017-04-09 ENCOUNTER — Other Ambulatory Visit: Payer: Self-pay | Admitting: Endocrinology

## 2017-04-09 DIAGNOSIS — E119 Type 2 diabetes mellitus without complications: Secondary | ICD-10-CM

## 2017-05-21 ENCOUNTER — Encounter: Payer: Self-pay | Admitting: Family Medicine

## 2017-05-28 ENCOUNTER — Other Ambulatory Visit (INDEPENDENT_AMBULATORY_CARE_PROVIDER_SITE_OTHER): Payer: 59

## 2017-05-28 DIAGNOSIS — E1165 Type 2 diabetes mellitus with hyperglycemia: Secondary | ICD-10-CM | POA: Diagnosis not present

## 2017-05-28 LAB — BASIC METABOLIC PANEL
BUN: 22 mg/dL (ref 6–23)
CHLORIDE: 101 meq/L (ref 96–112)
CO2: 29 meq/L (ref 19–32)
Calcium: 10.3 mg/dL (ref 8.4–10.5)
Creatinine, Ser: 1 mg/dL (ref 0.40–1.50)
GFR: 81.76 mL/min (ref >60.00)
Glucose, Bld: 106 mg/dL — ABNORMAL HIGH (ref 70–99)
POTASSIUM: 4 meq/L (ref 3.5–5.1)
Sodium: 137 mEq/L (ref 135–145)

## 2017-05-28 LAB — HEMOGLOBIN A1C: Hgb A1c MFr Bld: 6.3 % (ref 4.6–6.5)

## 2017-06-04 ENCOUNTER — Ambulatory Visit: Payer: 59 | Admitting: Endocrinology

## 2017-06-04 ENCOUNTER — Encounter: Payer: Self-pay | Admitting: Endocrinology

## 2017-06-04 VITALS — BP 126/88 | HR 80 | Ht 71.0 in | Wt 228.0 lb

## 2017-06-04 DIAGNOSIS — E782 Mixed hyperlipidemia: Secondary | ICD-10-CM | POA: Diagnosis not present

## 2017-06-04 DIAGNOSIS — E1165 Type 2 diabetes mellitus with hyperglycemia: Secondary | ICD-10-CM

## 2017-06-04 NOTE — Patient Instructions (Addendum)
Check blood sugars on waking up  3/7 days  Also check blood sugars about 2 hours after a meal and do this after different meals by rotation  Recommended blood sugar levels on waking up is 90-130 and about 2 hours after meal is 130-160 More at nite   Please bring your blood sugar monitor to each visit, thank you  Walk daily

## 2017-06-04 NOTE — Progress Notes (Signed)
Patient ID: Kevin Barnes, male   DOB: July 15, 1961, 56 y.o.   MRN: [id number]            Reason for Appointment: Follow-up for Type 2 Diabetes  Referring physician: Birdie Riddle   History of Present Illness:          Date of diagnosis of type 2 diabetes mellitus:  2013      Background history:  He was diagnosed to have diabetes with a glucose of 132 in 2013 and baseline A1c of 6.4 He was started on metformin in 01/2013 when blood sugars were higher His blood sugars were reasonably controlled in the first 2 years or so, he was also given glipizide initially in 2015 but this caused hypoglycemia and he did not continue this. He had been followed up relatively sporadically He was also tried on Januvia subsequently in 2017 when his blood sugars were higher but this was changed to [place]  He did not take this because of the cost   Recent history:     A1c baseline in February was 9.2, this is now 6.3  Non-insulin hypoglycemic drugs the patient is taking are:  Jardiance 25 mg daily, Metformin 1g bid, Amaryl 1 mg at suppertime  Current management, blood sugar patterns and problems identified:  On his last visit he was changed from glipizide to Amaryl because of tendency to occasional daytime hypoglycemia  Also now with his using Amaryl his fasting blood sugars are averaging about 25 mg lower than before  He is checking his blood sugars almost 3 times a day but this is mostly during the day and frequently not at suppertime since he goes to bed sometimes an hour after eating  His blood sugars are still fairly good throughout the day except sporadic high postprandial readings  He says he tries to eat small frequent meals and generally low carbohydrate  However in the morning may just sometimes eat potatoes and other carbohydrates although rarely cereal and this may occasionally send his blood sugar over 180  He has had only one episode of low blood sugar with a reading of 55 in the early  afternoon from not eating on time  Is also trying to start some exercise lately  However he thinks that because of being on medication he has gained some weight        Side effects from medications have : None, tries to take metformin with food  Compliance with the medical regimen: Inconsistent Hypoglycemia:   History as he feels low sugars if the blood sugar is <110  Glucose monitoring:  done 1 times a day         Glucometer: One Touch Verio .       Blood Glucose readings by time of day and averages from meter download:  Mean values apply above for all meters except median for One Touch  PRE-MEAL Fasting Lunch Dinner Bedtime Overall  Glucose range: 113-141    148  55-214   Mean/median: 122  116  107   116    POST-MEAL PC Breakfast PC Lunch PC Dinner  Glucose range: 98-214     Mean/median:        Self-care: The diet that the patient has been following is: tries to limit Drinks with sugar .     Meal times are:  Breakfast is at 7 am Lunch: Variable Dinner: 7 pm    Typical meal intake: Breakfast is small.  He will have  some times a day  biscuit late morning His snacks will be peanut butter crackers or chips.                Dietician visit, most recent: 11/19/16               Exercise:  recently going to the Gym at least a couple of times a week  Weight history:  Wt Readings from Last 3 Encounters:  06/04/17 228 lb (103.4 kg)  03/05/17 223 lb 6.4 oz (101.3 kg)  01/29/17 224 lb 2 oz (101.7 kg)    Glycemic control:   Lab Results  Component Value Date   HGBA1C 6.3 05/28/2017   HGBA1C 6.6 (H) 02/26/2017   HGBA1C 9.2 (H) 09/04/2016   Lab Results  Component Value Date   MICROALBUR 2.8 (H) 12/03/2016   LDLCALC 96 11/15/2015   CREATININE 1.00 05/28/2017   Lab Results  Component Value Date   MICRALBCREAT 3.6 12/03/2016    Lab Results  Component Value Date   FRUCTOSAMINE 295 (H) 11/19/2016      Allergies as of 06/04/2017   No Known Allergies     Medication  List        Accurate as of 06/04/17  2:22 PM. Always use your most recent med list.          aspirin 81 MG tablet Take 81 mg by mouth daily.   B-12 5000 MCG Subl Place under the tongue.   butalbital-acetaminophen-caffeine 50-325-40 MG tablet Commonly known as:  FIORICET, ESGIC Take 1 tablet by mouth daily as needed for headache.   cholecalciferol 1000 units tablet Commonly known as:  VITAMIN D Take 2,000 Units by mouth daily.   fenofibrate 160 MG tablet TAKE 1 TABLET BY MOUTH ONCE DAILY   glimepiride 1 MG tablet Commonly known as:  AMARYL Take 1 tablet (1 mg total) by mouth daily before supper.   glucose blood test strip Commonly known as:  ONETOUCH VERIO Use one strip each time sugars are tested, pt checks sugars 1-2 times daily. Dx E11.9   JARDIANCE 25 MG Tabs tablet Generic drug:  empagliflozin TAKE 1 TABLET BY MOUTH ONCE DAILY   lisinopril-hydrochlorothiazide 20-25 MG tablet Commonly known as:  PRINZIDE,ZESTORETIC TAKE 1 TABLET BY MOUTH ONCE DAILY   metFORMIN 1000 MG tablet Commonly known as:  GLUCOPHAGE TAKE 1 TABLET BY MOUTH TWICE DAILY WITH MEALS   Omega-3 Krill Oil 1000 MG Caps Take by mouth.   OVER THE COUNTER MEDICATION Sunny Mood and Curamin   sildenafil 50 MG tablet Commonly known as:  VIAGRA TAKE TWO TABLETS BY MOUTH AS NEEDED FOR ERECTILE DYSFUNCTION   simvastatin 20 MG tablet Commonly known as:  ZOCOR TAKE 1 TABLET BY MOUTH IN THE EVENING       Allergies: No Known Allergies  Past Medical History:  Diagnosis Date  . Allergy   . Basal cell carcinoma of cheek 2016  . Diabetes mellitus   . Hyperlipidemia   . Hypertension   . Obesity   . Obesity     Past Surgical History:  Procedure Laterality Date  . PROSTATE SURGERY      Family History  Problem Relation Age of Onset  . Cancer Father        Prostate  . Heart disease Father   . Hyperlipidemia Father   . Hypertension Father   . Stroke Paternal Grandfather   . Diabetes  Neg Hx     Social History:  reports that he has quit smoking. he has never used  smokeless tobacco. He reports that he drinks about 0.6 oz of alcohol per week. He reports that he does not use drugs.   Review of Systems   Lipid history: He has had persistently high triglyceride, now taking fenofibrate in addition to his Lipitor    Lab Results  Component Value Date   CHOL 185 01/29/2017   HDL 51.70 01/29/2017   LDLCALC 96 11/15/2015   LDLDIRECT 109.0 01/29/2017   TRIG 218.0 (H) 01/29/2017   CHOLHDL 4 01/29/2017           Hypertension: Has had this for several years, taking lisinopril HCTZ    BP Readings from Last 3 Encounters:  06/04/17 126/88  03/05/17 130/72  01/29/17 112/70   Hypercalcemia: His calcium was high on his initial visit, tends to be upper normal now  Most recent eye exam was 1/18  Most recent foot exam:4/18    LABS:  No visits with results within 1 Week(s) from this visit.  Latest known visit with results is:  Lab on 05/28/2017  Component Date Value Ref Range Status  . Sodium 05/28/2017 137  135 - 145 mEq/L Final  . Potassium 05/28/2017 4.0  3.5 - 5.1 mEq/L Final  . Chloride 05/28/2017 101  96 - 112 mEq/L Final  . CO2 05/28/2017 29  19 - 32 mEq/L Final  . Glucose, Bld 05/28/2017 106* 70 - 99 mg/dL Final  . BUN 05/28/2017 22  6 - 23 mg/dL Final  . Creatinine, Ser 05/28/2017 1.00  0.40 - 1.50 mg/dL Final  . Calcium 05/28/2017 10.3  8.4 - 10.5 mg/dL Final  . GFR 05/28/2017 81.76  >60.00 mL/min Final  . Hgb A1c MFr Bld 05/28/2017 6.3  4.6 - 6.5 % Final   Glycemic Control Guidelines for People with Diabetes:Non Diabetic:  <6%Goal of Therapy: <7%Additional Action Suggested:  >8%     Physical Examination:  BP 126/88   Pulse 80   Ht 5\' 11"  (1.803 m)   Wt 228 lb (103.4 kg)   SpO2 96%   BMI 31.80 kg/m   Repeat blood pressure 124/82    ASSESSMENT:  Diabetes type 2, uncontrolled    See history of present illness for detailed discussion of  current diabetes management, blood sugar patterns and problems identified  His A1c is excellent now at 6.3 He is doing well with using Jardiance and also his fasting readings are better with using Amaryl in the evenings of glipizide which is also reducing his tendency to hypoglycemia He is somewhat inconsistent with his diet especially when he is traveling or eating out More recently is starting to do some exercise and hopefully will continue to be motivated  Blood sugars are fairly good throughout the day except when he is getting more carbohydrates especially in the morning Also needs to be checking sugars at bedtime  HYPERCALCEMIA: Improved, may well have mild hyperparathyroidism However is also taking HCTZ   PLAN:     Increase exercise  Try to cut back portions at dinnertime  Start monitoring blood sugars after supper/bedtime periodically  Follow-up with PCP regarding Hyperlipidemia  A1c in 3 months  Patient Instructions  Check blood sugars on waking up  3/7 days  Also check blood sugars about 2 hours after a meal and do this after different meals by rotation  Recommended blood sugar levels on waking up is 90-130 and about 2 hours after meal is 130-160 More at nite   Please bring your blood sugar monitor to each visit, thank you  Walk daily     Sylva Overley 06/04/2017, 2:22 PM   Note: This office note was prepared with Estate agent. Any transcriptional errors that result from this process are unintentional.

## 2017-06-10 ENCOUNTER — Other Ambulatory Visit: Payer: Self-pay | Admitting: Endocrinology

## 2017-06-10 DIAGNOSIS — E119 Type 2 diabetes mellitus without complications: Secondary | ICD-10-CM

## 2017-06-24 ENCOUNTER — Encounter: Payer: Self-pay | Admitting: Family Medicine

## 2017-06-24 ENCOUNTER — Telehealth: Payer: Self-pay

## 2017-06-24 ENCOUNTER — Ambulatory Visit: Payer: 59 | Admitting: Family Medicine

## 2017-06-24 ENCOUNTER — Other Ambulatory Visit: Payer: Self-pay

## 2017-06-24 VITALS — BP 124/80 | HR 86 | Temp 98.0°F | Resp 16 | Ht 71.0 in | Wt 226.2 lb

## 2017-06-24 DIAGNOSIS — R29818 Other symptoms and signs involving the nervous system: Secondary | ICD-10-CM | POA: Diagnosis not present

## 2017-06-24 DIAGNOSIS — E1169 Type 2 diabetes mellitus with other specified complication: Secondary | ICD-10-CM

## 2017-06-24 DIAGNOSIS — E785 Hyperlipidemia, unspecified: Secondary | ICD-10-CM

## 2017-06-24 DIAGNOSIS — R200 Anesthesia of skin: Secondary | ICD-10-CM

## 2017-06-24 LAB — LIPID PANEL
CHOLESTEROL: 196 mg/dL (ref 0–200)
HDL: 50.7 mg/dL (ref >39.00)
NonHDL: 144.88
Total CHOL/HDL Ratio: 4
Triglycerides: 250 mg/dL — ABNORMAL HIGH (ref 0.0–149.0)
VLDL: 50 mg/dL — ABNORMAL HIGH (ref 0.0–40.0)

## 2017-06-24 LAB — HEPATIC FUNCTION PANEL
ALK PHOS: 47 U/L (ref 39–117)
ALT: 28 U/L (ref 0–53)
AST: 19 U/L (ref 0–37)
Albumin: 5 g/dL (ref 3.5–5.2)
BILIRUBIN DIRECT: 0.1 mg/dL (ref 0.0–0.3)
TOTAL PROTEIN: 7.9 g/dL (ref 6.0–8.3)
Total Bilirubin: 0.5 mg/dL (ref 0.2–1.2)

## 2017-06-24 LAB — CBC WITH DIFFERENTIAL/PLATELET
BASOS PCT: 1 % (ref 0.0–3.0)
Basophils Absolute: 0.1 10*3/uL (ref 0.0–0.1)
EOS ABS: 0.4 10*3/uL (ref 0.0–0.7)
Eosinophils Relative: 5 % (ref 0.0–5.0)
HEMATOCRIT: 46.5 % (ref 39.0–52.0)
Hemoglobin: 15.9 g/dL (ref 13.0–17.0)
LYMPHS PCT: 34.2 % (ref 12.0–46.0)
Lymphs Abs: 3 10*3/uL (ref 0.7–4.0)
MCHC: 34.3 g/dL (ref 30.0–36.0)
MCV: 93.5 fl (ref 78.0–100.0)
Monocytes Absolute: 0.8 10*3/uL (ref 0.1–1.0)
Monocytes Relative: 8.6 % (ref 3.0–12.0)
NEUTROS ABS: 4.5 10*3/uL (ref 1.4–7.7)
Neutrophils Relative %: 51.2 % (ref 43.0–77.0)
PLATELETS: 392 10*3/uL (ref 150.0–400.0)
RBC: 4.97 Mil/uL (ref 4.22–5.81)
RDW: 12.4 % (ref 11.5–15.5)
WBC: 8.7 10*3/uL (ref 4.0–10.5)

## 2017-06-24 LAB — LDL CHOLESTEROL, DIRECT: Direct LDL: 111 mg/dL

## 2017-06-24 LAB — TSH: TSH: 0.81 u[IU]/mL (ref 0.35–4.50)

## 2017-06-24 NOTE — Telephone Encounter (Signed)
Spoke with patient regarding symptoms (scheduled for possible mini stroke).  Patient reports approximately 2 weeks ago his blood sugar dropped to about 80. He noticed the next day numbness and droopiness of right side of lip/mouth. Denies slurred speech. Symptoms have now resolved. Patient is scheduled to see PCP today at 0945.

## 2017-06-24 NOTE — Patient Instructions (Signed)
We'll determine the next steps based on the results of your MRI and carotid dopplers We'll notify you of your lab results and make any changes if needed Continue to follow Dr Ronnie Derby instructions to manage your diabetes- you're doing great! IF you develop any other symptoms or something changes, PLEASE go to the ER Call with any questions or concerns Hang in there!  We'll figure this out! Happy Holidays!!

## 2017-06-24 NOTE — Progress Notes (Signed)
   Subjective:    Patient ID: Osborne Oman, male    DOB: 01/13/1961, 56 y.o.   MRN: [id number]  HPI ? TIA- pt reports 'blood sugar dropped' 11/16 at work, felt 'sketchy', and was taking a decongestant for a head cold.  Saturday felt ok 'just not as clear headed'.  Traveled the week of Thanksgiving.  Friday 11/23 developed some numbness of R upper lip.  Thought maybe some slight drooping.  sxs completely resolved after 1 week.  No difficulty closing eyes.  No slurred speech.  No weakness of mouth or tongue.  Numbness 'was very pronounced by it wasn't constant'.  Pt has DM, HTN, hyperlipidemia.  + stress.   Review of Systems For ROS see HPI     Objective:   Physical Exam  Constitutional: He is oriented to person, place, and time. He appears well-developed and well-nourished. No distress.  HENT:  Head: Normocephalic and atraumatic.  Right Ear: External ear normal.  Left Ear: External ear normal.  Nose: Nose normal.  Mouth/Throat: Oropharynx is clear and moist.  Eyes: Conjunctivae and EOM are normal. Pupils are equal, round, and reactive to light.  Neck: Normal range of motion. Neck supple. No thyromegaly present.  Cardiovascular: Normal rate, regular rhythm, normal heart sounds and intact distal pulses.  No murmur heard. No carotid bruits  Pulmonary/Chest: Effort normal and breath sounds normal. No respiratory distress.  Abdominal: Soft. Bowel sounds are normal. He exhibits no distension.  Musculoskeletal: He exhibits no edema.  Lymphadenopathy:    He has no cervical adenopathy.  Neurological: He is alert and oriented to person, place, and time. He has normal reflexes. No cranial nerve deficit. Coordination normal.  Sensation intact and symmetric No facial droop noted  Skin: Skin is warm and dry.  Psychiatric: He has a normal mood and affect. His behavior is normal.  Vitals reviewed.         Assessment & Plan:  Facial numbness- pt reports he had intermittent numbness of R  upper lip x1 week which has completely resolved.  Time course is not consistent w/ TIA nor is the intermittent nature of his sxs.  However with his risk factors- DM, HTN, hyperlipidemia- and family hx of CVA, will get MRI and carotid dopplers to assess for possible TIA/CVA.  Pt expressed understanding and is in agreement w/ plan.   Facial droop- not present today but pt reports it was 'quite notable' when it was present.  Asked him about difficulty closing eye or drooling or inability to raise eyebrow- he denied any issues.  First thought was Bell's Palsy but as this has resolved, it is nearly impossible to say.  Again, will get MRI, carotid dopplers due to risk factors.  Pt was advised that if again develops sxs he is to go to ER immediately.  Pt expressed understanding and is in agreement w/ plan.

## 2017-06-24 NOTE — Assessment & Plan Note (Signed)
Check labs and adjust medication if not at goal in setting of ? TIA.  Pt expressed understanding and is in agreement w/ plan.

## 2017-06-25 ENCOUNTER — Other Ambulatory Visit: Payer: Self-pay | Admitting: General Practice

## 2017-06-25 MED ORDER — ATORVASTATIN CALCIUM 20 MG PO TABS: 20.0000 mg | ORAL_TABLET | Freq: Every evening | ORAL | 6 refills | Status: DC

## 2017-06-25 NOTE — Addendum Note (Signed)
Addended by: Desmond Dike L on: 06/25/2017 10:06 AM   Modules accepted: Orders

## 2017-06-26 ENCOUNTER — Other Ambulatory Visit: Payer: Self-pay | Admitting: Endocrinology

## 2017-06-27 ENCOUNTER — Telehealth: Payer: Self-pay | Admitting: Family Medicine

## 2017-06-27 NOTE — Telephone Encounter (Signed)
Copied from Elaine [phone]. Topic: General - Other >> Jun 27, 2017 10:20 AM Darl Householder, RMA wrote: Reason for CRM: Melanie from [employer] is calling requesting a callback back at [phone] concerning a prior auth for radiology

## 2017-06-28 NOTE — Telephone Encounter (Signed)
LM for Kevin Barnes to call me back regarding pre-cert.

## 2017-07-02 ENCOUNTER — Other Ambulatory Visit: Payer: Self-pay | Admitting: Endocrinology

## 2017-07-02 ENCOUNTER — Ambulatory Visit (HOSPITAL_COMMUNITY)
Admission: RE | Admit: 2017-07-02 | Discharge: 2017-07-02 | Disposition: A | Discharge status: Home / Self Care | Payer: 59 | Source: Ambulatory Visit | Attending: Family Medicine | Admitting: Family Medicine

## 2017-07-02 DIAGNOSIS — R202 Paresthesia of skin: Secondary | ICD-10-CM | POA: Diagnosis not present

## 2017-07-02 DIAGNOSIS — R200 Anesthesia of skin: Secondary | ICD-10-CM

## 2017-07-02 DIAGNOSIS — R29818 Other symptoms and signs involving the nervous system: Secondary | ICD-10-CM

## 2017-07-09 ENCOUNTER — Ambulatory Visit (HOSPITAL_COMMUNITY)
Admission: RE | Admit: 2017-07-09 | Discharge: 2017-07-09 | Disposition: A | Discharge status: Home / Self Care | Payer: 59 | Source: Ambulatory Visit | Attending: Family Medicine | Admitting: Family Medicine

## 2017-07-09 DIAGNOSIS — R200 Anesthesia of skin: Secondary | ICD-10-CM | POA: Diagnosis not present

## 2017-07-09 NOTE — Progress Notes (Addendum)
Carotid duplex prelim: no significant ICA stenosis.  Burt Piatek Eunice, RDMS, RVT   

## 2017-08-08 ENCOUNTER — Other Ambulatory Visit: Payer: Self-pay | Admitting: Endocrinology

## 2017-08-08 DIAGNOSIS — E119 Type 2 diabetes mellitus without complications: Secondary | ICD-10-CM

## 2017-08-27 ENCOUNTER — Other Ambulatory Visit: Payer: 59

## 2017-09-13 ENCOUNTER — Other Ambulatory Visit: Payer: Self-pay | Admitting: Family Medicine

## 2017-09-13 DIAGNOSIS — E119 Type 2 diabetes mellitus without complications: Secondary | ICD-10-CM

## 2017-09-13 DIAGNOSIS — I10 Essential (primary) hypertension: Secondary | ICD-10-CM

## 2017-09-24 ENCOUNTER — Other Ambulatory Visit (INDEPENDENT_AMBULATORY_CARE_PROVIDER_SITE_OTHER): Payer: 59

## 2017-09-24 DIAGNOSIS — E1165 Type 2 diabetes mellitus with hyperglycemia: Secondary | ICD-10-CM | POA: Diagnosis not present

## 2017-09-24 DIAGNOSIS — E782 Mixed hyperlipidemia: Secondary | ICD-10-CM

## 2017-09-24 LAB — LIPID PANEL
Cholesterol: 125 mg/dL (ref 0–200)
HDL: 39.8 mg/dL (ref >39.00)
LDL Cholesterol: 50 mg/dL (ref 0–99)
NONHDL: 84.76
Total CHOL/HDL Ratio: 3
Triglycerides: 172 mg/dL — ABNORMAL HIGH (ref 0.0–149.0)
VLDL: 34.4 mg/dL (ref 0.0–40.0)

## 2017-09-24 LAB — COMPREHENSIVE METABOLIC PANEL
ALT: 38 U/L (ref 0–53)
AST: 23 U/L (ref 0–37)
Albumin: 4.4 g/dL (ref 3.5–5.2)
Alkaline Phosphatase: 55 U/L (ref 39–117)
BUN: 19 mg/dL (ref 6–23)
CO2: 29 mEq/L (ref 19–32)
Calcium: 10 mg/dL (ref 8.4–10.5)
Chloride: 97 mEq/L (ref 96–112)
Creatinine, Ser: 0.96 mg/dL (ref 0.40–1.50)
GFR: 85.6 mL/min (ref >60.00)
GLUCOSE: 133 mg/dL — AB (ref 70–99)
POTASSIUM: 3.7 meq/L (ref 3.5–5.1)
Sodium: 134 mEq/L — ABNORMAL LOW (ref 135–145)
Total Bilirubin: 0.7 mg/dL (ref 0.2–1.2)
Total Protein: 7.7 g/dL (ref 6.0–8.3)

## 2017-09-24 LAB — HEMOGLOBIN A1C: Hgb A1c MFr Bld: 6.5 % (ref 4.6–6.5)

## 2017-09-25 LAB — SPECIMEN STATUS REPORT

## 2017-09-27 LAB — PARATHYROID HORMONE, INTACT (NO CA): PTH: 21 pg/mL (ref 15–65)

## 2017-10-01 ENCOUNTER — Ambulatory Visit (INDEPENDENT_AMBULATORY_CARE_PROVIDER_SITE_OTHER): Payer: 59 | Admitting: Endocrinology

## 2017-10-01 ENCOUNTER — Encounter: Payer: Self-pay | Admitting: Endocrinology

## 2017-10-01 VITALS — BP 136/80 | HR 87 | Resp 16 | Wt 224.0 lb

## 2017-10-01 DIAGNOSIS — E119 Type 2 diabetes mellitus without complications: Secondary | ICD-10-CM | POA: Diagnosis not present

## 2017-10-01 MED ORDER — EMPAGLIFLOZIN 25 MG PO TABS: 25.0000 mg | ORAL_TABLET | Freq: Every day | ORAL | 1 refills | Status: DC

## 2017-10-01 MED ORDER — GLIMEPIRIDE 1 MG PO TABS: ORAL_TABLET | ORAL | 1 refills | Status: DC

## 2017-10-01 MED ORDER — METFORMIN HCL 1000 MG PO TABS
1000.0000 mg | ORAL_TABLET | Freq: Two times a day (BID) | ORAL | 1 refills | Status: DC
Start: 2017-10-01 — End: 2018-04-09

## 2017-10-01 NOTE — Progress Notes (Signed)
Patient ID: Kevin Barnes, male   DOB: 1961-04-28, 57 y.o.   MRN: [id number]            Reason for Appointment: Follow-up for Type 2 Diabetes  Referring physician: Birdie Riddle   History of Present Illness:          Date of diagnosis of type 2 diabetes mellitus:  2013      Background history:  He was diagnosed to have diabetes with a glucose of 132 in 2013 and baseline A1c of 6.4 He was started on metformin in 01/2013 when blood sugars were higher His blood sugars were reasonably controlled in the first 2 years or so, he was also given glipizide initially in 2015 but this caused hypoglycemia and he did not continue this. He had been followed up relatively sporadically He was also tried on Januvia subsequently in 2017 when his blood sugars were higher but this was changed to [place]  He did not take this because of the cost   Recent history:     A1c baseline in February 2018 was 9.2, this is now stable between 6.3 and 6.6 for the last 3 times   Non-insulin hypoglycemic drugs the patient is taking are:  Jardiance 25 mg daily pm , Metformin 1g bid, Amaryl 1 mg at suppertime  Current management, blood sugar patterns and problems identified:  On his last visit he was continued on the same regimen with his 3 drugs including Amaryl at dinnertime  He did not bring his monitor down for download today but he thinks his blood sugars are fairly close to normal  However lab glucose was 133 fasting  Is gradually losing weight  He is now trying to be fairly regular with exercise since his last visit at least a couple of times a week  No hypoglycemia more recently with Amaryl  No side effects with Jardiance or Metformin  He still tries to eat small frequent meals and generally low carbohydrate  Does tend to not watch his diet when he is traveling and he is planning to do that soon        Side effects from medications have : None, tries to take metformin with food  Hypoglycemia:  he  feels low sugars if the blood sugar is <90  Glucose monitoring:  done 1 times a day         Glucometer: One Touch Verio .       Blood Glucose readings by recall  Fasting about 120, after meals but 130, occasionally up to 170   Previous readings: Mean values apply above for all meters except median for One Touch  PRE-MEAL Fasting Lunch Dinner Bedtime Overall  Glucose range: 113-141    148  55-214   Mean/median: 122  116  107   116    POST-MEAL PC Breakfast PC Lunch PC Dinner  Glucose range: 98-214     Mean/median:        Self-care: The diet that the patient has been following is: tries to limit Drinks with sugar .     Meal times are:  Breakfast is at 7 am Lunch: Variable Dinner: 7 pm    Typical meal intake: Breakfast is small.  He will have  some times a day biscuit late morning His snacks will be peanut butter crackers or chips.                Dietician visit, most recent: 11/19/16  Exercise:  going to the Gym at least a couple of times a week  Weight history:  Wt Readings from Last 3 Encounters:  10/01/17 224 lb (101.6 kg)  06/24/17 226 lb 4 oz (102.6 kg)  06/04/17 228 lb (103.4 kg)    Glycemic control:   Lab Results  Component Value Date   HGBA1C 6.5 09/24/2017   HGBA1C 6.3 05/28/2017   HGBA1C 6.6 (H) 02/26/2017   Lab Results  Component Value Date   MICROALBUR 2.8 (H) 12/03/2016   LDLCALC 50 09/24/2017   CREATININE 0.96 09/24/2017   Lab Results  Component Value Date   MICRALBCREAT 3.6 12/03/2016    Lab Results  Component Value Date   FRUCTOSAMINE 295 (H) 11/19/2016      Allergies as of 10/01/2017   No Known Allergies     Medication List        Accurate as of 10/01/17  9:08 PM. Always use your most recent med list.          aspirin 81 MG tablet Take 81 mg by mouth daily.   atorvastatin 20 MG tablet Commonly known as:  LIPITOR Take 1 tablet (20 mg total) by mouth every evening.   B-12 5000 MCG Subl Place under the  tongue.   butalbital-acetaminophen-caffeine 50-325-40 MG tablet Commonly known as:  FIORICET, ESGIC Take 1 tablet by mouth daily as needed for headache.   cholecalciferol 1000 units tablet Commonly known as:  VITAMIN D Take 2,000 Units by mouth daily.   empagliflozin 25 MG Tabs tablet Commonly known as:  JARDIANCE Take 25 mg by mouth daily.   fenofibrate 160 MG tablet TAKE 1 TABLET BY MOUTH ONCE DAILY   glimepiride 1 MG tablet Commonly known as:  AMARYL TAKE 1 TABLET BY MOUTH ONCE DAILY BEFORE  SUPPER   glucose blood test strip Commonly known as:  ONETOUCH VERIO Use one strip each time sugars are tested, pt checks sugars 1-2 times daily. Dx E11.9   lisinopril-hydrochlorothiazide 20-25 MG tablet Commonly known as:  PRINZIDE,ZESTORETIC TAKE 1 TABLET BY MOUTH ONCE DAILY   metFORMIN 1000 MG tablet Commonly known as:  GLUCOPHAGE Take 1 tablet (1,000 mg total) by mouth 2 (two) times daily with a meal.   Omega-3 Krill Oil 1000 MG Caps Take by mouth.   OVER THE COUNTER MEDICATION Sunny Mood and Curamin   sildenafil 50 MG tablet Commonly known as:  VIAGRA TAKE TWO TABLETS BY MOUTH AS NEEDED FOR ERECTILE DYSFUNCTION       Allergies: No Known Allergies  Past Medical History:  Diagnosis Date  . Allergy   . Basal cell carcinoma of cheek 2016  . Diabetes mellitus   . Hyperlipidemia   . Hypertension   . Obesity   . Obesity     Past Surgical History:  Procedure Laterality Date  . PROSTATE SURGERY      Family History  Problem Relation Age of Onset  . Cancer Father        Prostate  . Heart disease Father   . Hyperlipidemia Father   . Hypertension Father   . Stroke Paternal Grandfather   . Diabetes Neg Hx     Social History:  reports that he has quit smoking. he has never used smokeless tobacco. He reports that he drinks about 0.6 oz of alcohol per week. He reports that he does not use drugs.   Review of Systems   Lipid history: He has had persistently  high triglycerides with control in the past, now  this is below 200 and previously 250 He is, taking fenofibrate in addition to his Lipitor    Lab Results  Component Value Date   CHOL 125 09/24/2017   HDL 39.80 09/24/2017   LDLCALC 50 09/24/2017   LDLDIRECT 111.0 06/24/2017   TRIG 172.0 (H) 09/24/2017   CHOLHDL 3 09/24/2017           Hypertension: Has had this for several years, taking lisinopril HCTZ with consistent control   BP Readings from Last 3 Encounters:  10/01/17 136/80  06/24/17 124/80  06/04/17 126/88   Hypercalcemia: His calcium was high on his initial visit, tends to be upper normal consistently PTH is not elevated with good control  Most recent eye exam was 1/18  Most recent foot exam:4/18    LABS:  No visits with results within 1 Week(s) from this visit.  Latest known visit with results is:  Lab on 09/24/2017  Component Date Value Ref Range Status  . PTH 09/24/2017 21  15 - 65 pg/mL Final  . Cholesterol 09/24/2017 125  0 - 200 mg/dL Final   ATP III Classification       Desirable:  < 200 mg/dL               Borderline High:  200 - 239 mg/dL          High:  > = 240 mg/dL  . Triglycerides 09/24/2017 172.0* 0.0 - 149.0 mg/dL Final   Normal:  <150 mg/dLBorderline High:  150 - 199 mg/dL  . HDL 09/24/2017 39.80  >39.00 mg/dL Final  . VLDL 09/24/2017 34.4  0.0 - 40.0 mg/dL Final  . LDL Cholesterol 09/24/2017 50  0 - 99 mg/dL Final  . Total CHOL/HDL Ratio 09/24/2017 3   Final                  Men          Women1/2 Average Risk     3.4          3.3Average Risk          5.0          4.42X Average Risk          9.6          7.13X Average Risk          15.0          11.0                      . NonHDL 09/24/2017 84.76   Final   NOTE:  Non-HDL goal should be 30 mg/dL higher than patient's LDL goal (i.e. LDL goal of < 70 mg/dL, would have non-HDL goal of < 100 mg/dL)  . Sodium 09/24/2017 134* 135 - 145 mEq/L Final  . Potassium 09/24/2017 3.7  3.5 - 5.1 mEq/L Final  .  Chloride 09/24/2017 97  96 - 112 mEq/L Final  . CO2 09/24/2017 29  19 - 32 mEq/L Final  . Glucose, Bld 09/24/2017 133* 70 - 99 mg/dL Final  . BUN 09/24/2017 19  6 - 23 mg/dL Final  . Creatinine, Ser 09/24/2017 0.96  0.40 - 1.50 mg/dL Final  . Total Bilirubin 09/24/2017 0.7  0.2 - 1.2 mg/dL Final  . Alkaline Phosphatase 09/24/2017 55  39 - 117 U/L Final  . AST 09/24/2017 23  0 - 37 U/L Final  . ALT 09/24/2017 38  0 - 53 U/L Final  . Total Protein 09/24/2017  7.7  6.0 - 8.3 g/dL Final  . Albumin 09/24/2017 4.4  3.5 - 5.2 g/dL Final  . Calcium 09/24/2017 10.0  8.4 - 10.5 mg/dL Final  . GFR 09/24/2017 85.60  >60.00 mL/min Final  . Hgb A1c MFr Bld 09/24/2017 6.5  4.6 - 6.5 % Final   Glycemic Control Guidelines for People with Diabetes:Non Diabetic:  <6%Goal of Therapy: <7%Additional Action Suggested:  >8%   . specimen status report 09/24/2017 Comment   Preliminary   NTI Serum Gel Tube    Physical Examination:  BP 136/80 (BP Location: Left Arm, Patient Position: Sitting, Cuff Size: Normal)   Pulse 87   Resp 16   Wt 224 lb (101.6 kg)   SpO2 97%   BMI 31.24 kg/m       ASSESSMENT:  Diabetes type 2, recent BMI 31  See history of present illness for detailed discussion of current diabetes management, blood sugar patterns and problems identified  His A1c is consistently well controlled with using Jardiance in addition to his metformin and low-dose Amaryl His weight is gradually coming down He is generally motivated to watch his portions and carbohydrates More recently trying to exercise at least couple of times a week also and this is helping   HYPERCALCEMIA: Improved, may be related to taking HCTZ since PTH is low normal   LIPIDS: Better controlled triglycerides now and this may be from continued improved diet and blood sugar control   PLAN:   No change in medication regimen  Consistent diet and exercise Reminded him to check readings consistently after meals also  There are  no Patient Instructions on file for this visit.   Elayne Snare 10/01/2017, 9:08 PM   Note: This office note was prepared with Dragon voice recognition system technology. Any transcriptional errors that result from this process are unintentional.

## 2017-10-23 IMAGING — DX DG CHEST 2V
2 series · 2 of 2 positions shown · non-contrast
Comparison: None.

CLINICAL DATA: Cough, fever, head congestion

EXAM:
CHEST  2 VIEW

[chest pa]
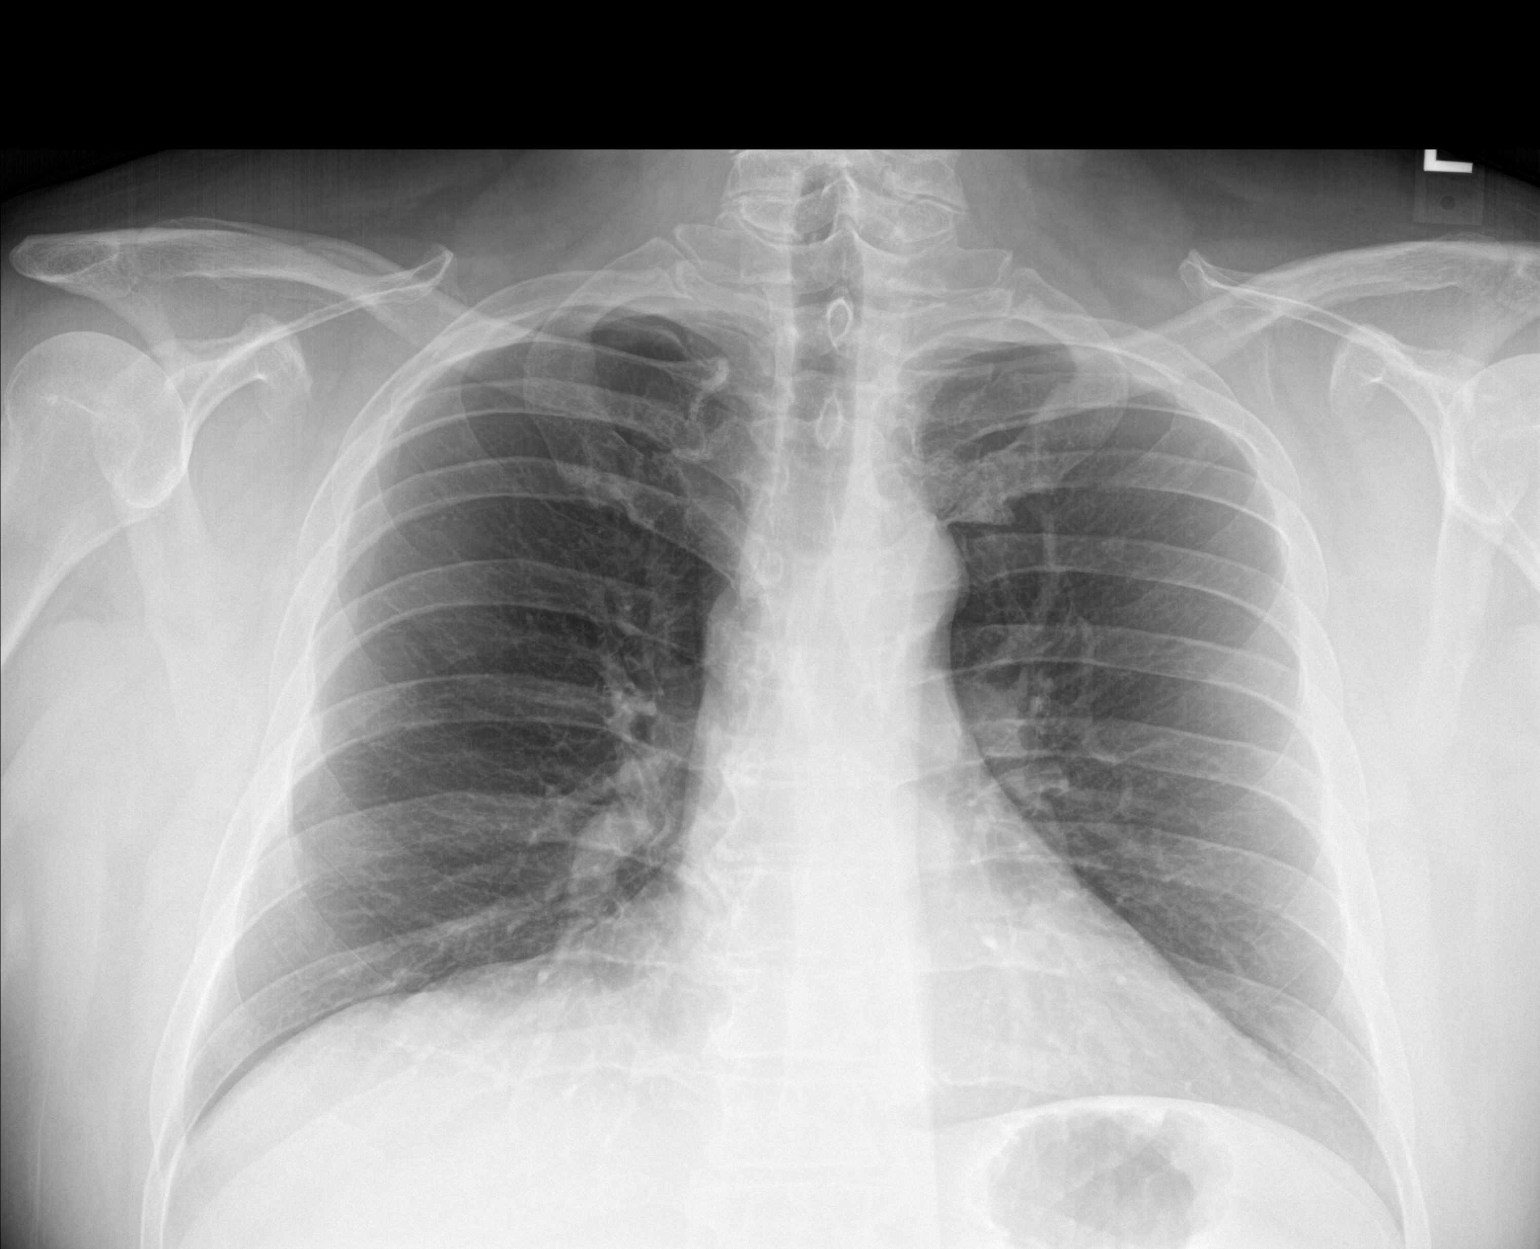

[chest lat]
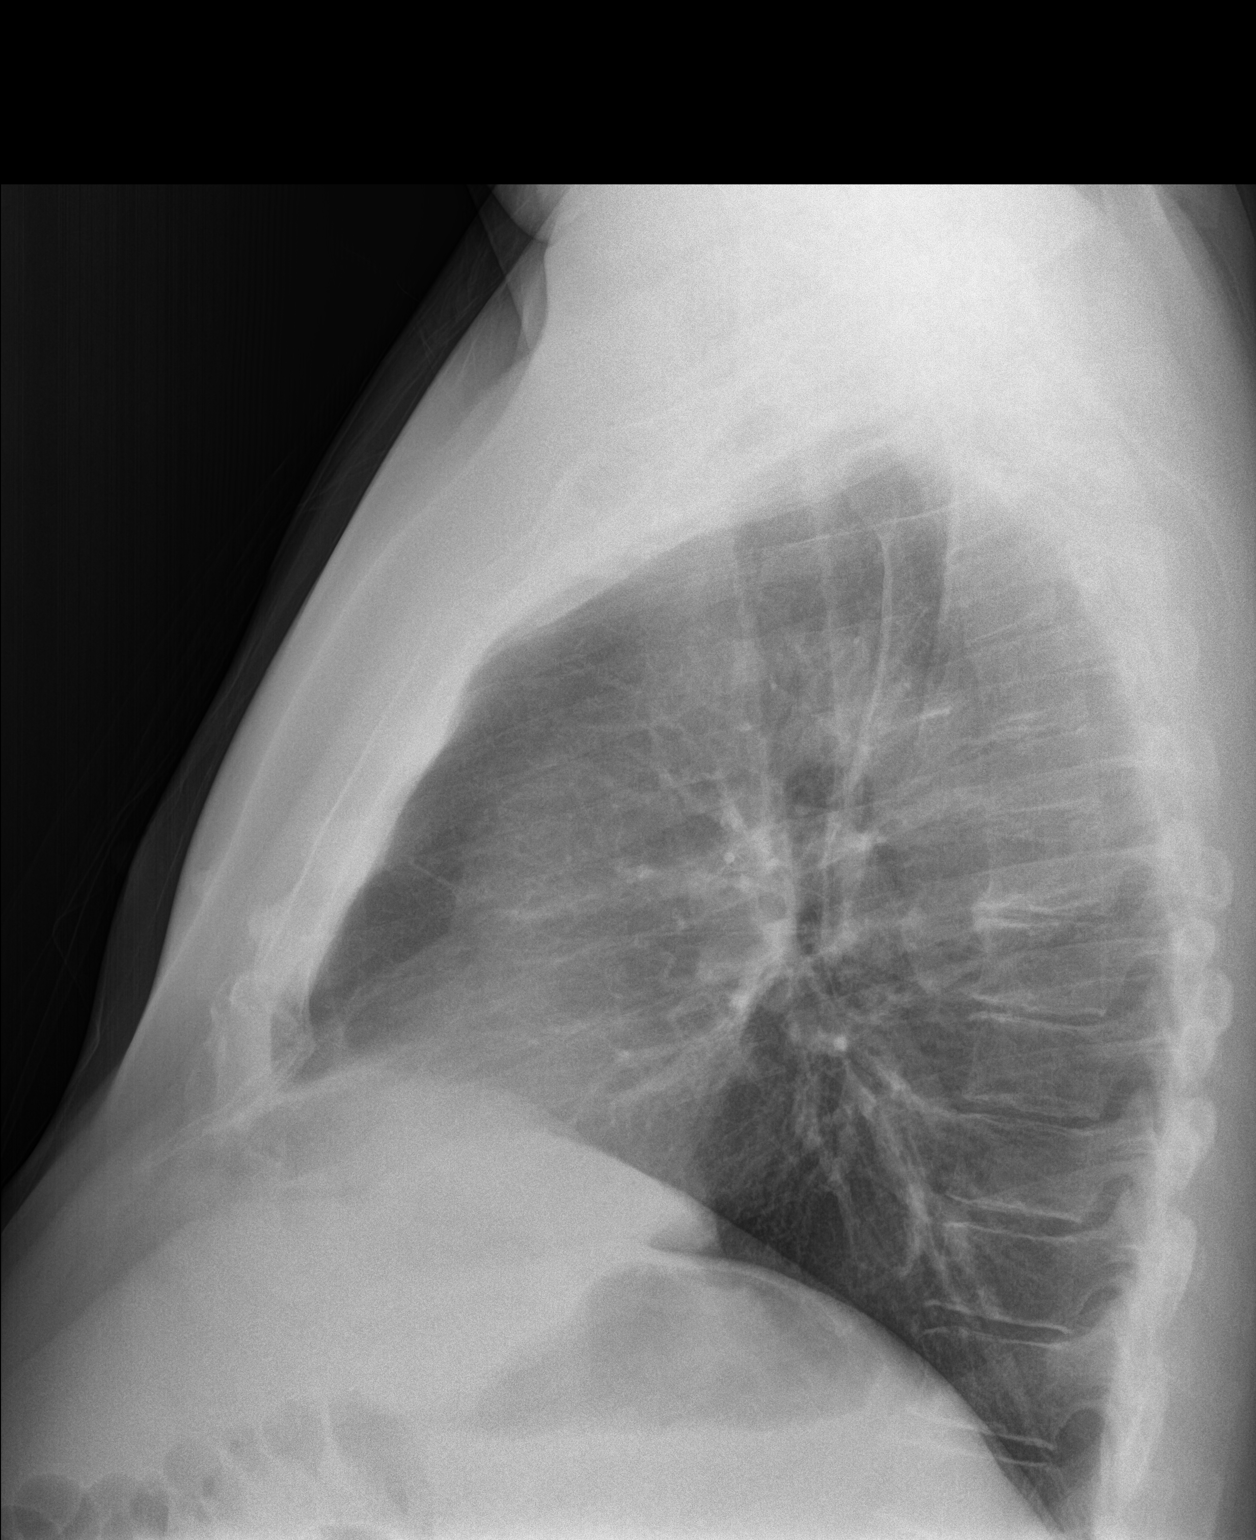

[2 of 2 positions shown; findings below may reference images not displayed]

FINDINGS: The heart size and mediastinal contours are within normal limits.
Both lungs are clear. The visualized skeletal structures are
unremarkable.
IMPRESSION: No active cardiopulmonary disease.

## 2017-12-11 ENCOUNTER — Other Ambulatory Visit: Payer: Self-pay | Admitting: Family Medicine

## 2017-12-16 ENCOUNTER — Encounter: Payer: Self-pay | Admitting: Family Medicine

## 2018-01-20 ENCOUNTER — Other Ambulatory Visit: Payer: Self-pay | Admitting: Family Medicine

## 2018-01-26 ENCOUNTER — Other Ambulatory Visit: Payer: Self-pay | Admitting: Endocrinology

## 2018-01-28 ENCOUNTER — Other Ambulatory Visit: Payer: 59

## 2018-02-04 ENCOUNTER — Ambulatory Visit: Payer: 59 | Admitting: Endocrinology

## 2018-02-18 ENCOUNTER — Other Ambulatory Visit: Payer: Self-pay | Admitting: Family Medicine

## 2018-02-25 ENCOUNTER — Ambulatory Visit (INDEPENDENT_AMBULATORY_CARE_PROVIDER_SITE_OTHER): Payer: 59 | Admitting: Family Medicine

## 2018-02-25 ENCOUNTER — Other Ambulatory Visit: Payer: Self-pay

## 2018-02-25 ENCOUNTER — Encounter: Payer: Self-pay | Admitting: Family Medicine

## 2018-02-25 VITALS — BP 130/86 | HR 90 | Temp 98.4°F | Resp 16 | Ht 70.25 in | Wt 225.2 lb

## 2018-02-25 DIAGNOSIS — E785 Hyperlipidemia, unspecified: Secondary | ICD-10-CM

## 2018-02-25 DIAGNOSIS — Z0000 Encounter for general adult medical examination without abnormal findings: Secondary | ICD-10-CM

## 2018-02-25 DIAGNOSIS — E1169 Type 2 diabetes mellitus with other specified complication: Secondary | ICD-10-CM | POA: Diagnosis not present

## 2018-02-25 DIAGNOSIS — E119 Type 2 diabetes mellitus without complications: Secondary | ICD-10-CM | POA: Diagnosis not present

## 2018-02-25 DIAGNOSIS — Z125 Encounter for screening for malignant neoplasm of prostate: Secondary | ICD-10-CM

## 2018-02-25 LAB — BASIC METABOLIC PANEL
BUN: 19 mg/dL (ref 6–23)
CALCIUM: 10.7 mg/dL — AB (ref 8.4–10.5)
CHLORIDE: 96 meq/L (ref 96–112)
CO2: 31 meq/L (ref 19–32)
Creatinine, Ser: 1.04 mg/dL (ref 0.40–1.50)
GFR: 77.93 mL/min (ref >60.00)
Glucose, Bld: 111 mg/dL — ABNORMAL HIGH (ref 70–99)
Potassium: 4.4 mEq/L (ref 3.5–5.1)
SODIUM: 136 meq/L (ref 135–145)

## 2018-02-25 LAB — PSA: PSA: 2.26 ng/mL (ref 0.10–4.00)

## 2018-02-25 LAB — CBC WITH DIFFERENTIAL/PLATELET
BASOS ABS: 0.1 10*3/uL (ref 0.0–0.1)
Basophils Relative: 1.3 % (ref 0.0–3.0)
EOS ABS: 0.3 10*3/uL (ref 0.0–0.7)
EOS PCT: 3.5 % (ref 0.0–5.0)
HCT: 44.9 % (ref 39.0–52.0)
HEMOGLOBIN: 15.7 g/dL (ref 13.0–17.0)
LYMPHS ABS: 2.3 10*3/uL (ref 0.7–4.0)
Lymphocytes Relative: 26.3 % (ref 12.0–46.0)
MCHC: 34.9 g/dL (ref 30.0–36.0)
MCV: 91.9 fl (ref 78.0–100.0)
Monocytes Absolute: 0.7 10*3/uL (ref 0.1–1.0)
Monocytes Relative: 8.1 % (ref 3.0–12.0)
NEUTROS PCT: 60.8 % (ref 43.0–77.0)
Neutro Abs: 5.2 10*3/uL (ref 1.4–7.7)
Platelets: 374 10*3/uL (ref 150.0–400.0)
RBC: 4.89 Mil/uL (ref 4.22–5.81)
RDW: 12.5 % (ref 11.5–15.5)
WBC: 8.6 10*3/uL (ref 4.0–10.5)

## 2018-02-25 LAB — LIPID PANEL
CHOLESTEROL: 149 mg/dL (ref 0–200)
HDL: 49.6 mg/dL (ref >39.00)
LDL CALC: 71 mg/dL (ref 0–99)
NonHDL: 99.14
Total CHOL/HDL Ratio: 3
Triglycerides: 143 mg/dL (ref 0.0–149.0)
VLDL: 28.6 mg/dL (ref 0.0–40.0)

## 2018-02-25 LAB — TSH: TSH: 0.75 u[IU]/mL (ref 0.35–4.50)

## 2018-02-25 LAB — HEPATIC FUNCTION PANEL
ALBUMIN: 4.7 g/dL (ref 3.5–5.2)
ALT: 25 U/L (ref 0–53)
AST: 19 U/L (ref 0–37)
Alkaline Phosphatase: 45 U/L (ref 39–117)
BILIRUBIN TOTAL: 0.6 mg/dL (ref 0.2–1.2)
Bilirubin, Direct: 0.1 mg/dL (ref 0.0–0.3)
TOTAL PROTEIN: 7.7 g/dL (ref 6.0–8.3)

## 2018-02-25 NOTE — Assessment & Plan Note (Signed)
Chronic problem.  Following w/ Dr Dwyane Dee.  Overdue on eye exam.  Pt says he will get eye exam again in December- and will stick to a q2 yr exam.  Foot exam done today.

## 2018-02-25 NOTE — Assessment & Plan Note (Signed)
Pt's PE WNL w/ exception of obesity.  UTD on colonoscopy, immunizations.  Check labs.  Anticipatory guidance provided.

## 2018-02-25 NOTE — Assessment & Plan Note (Signed)
Chronic problem.  Tolerating statin w/o difficulty.  Check labs.  Adjust meds prn  

## 2018-02-25 NOTE — Progress Notes (Signed)
   Subjective:    Patient ID: Kevin Barnes, male    DOB: April 09, 1961, 57 y.o.   MRN: [id number]  HPI CPE- UTD on Tdap, colonoscopy.  Sees Urology.  Due for eye exam- plans to schedule in January (q 2 yrs).     Review of Systems Patient reports no vision/hearing changes, anorexia, fever ,adenopathy, persistant/recurrent hoarseness, swallowing issues, chest pain, palpitations, edema, persistant/recurrent cough, hemoptysis, dyspnea (rest,exertional, paroxysmal nocturnal), gastrointestinal  bleeding (melena, rectal bleeding), abdominal pain, excessive heart burn, GU symptoms (dysuria, hematuria, voiding/incontinence issues) syncope, focal weakness, memory loss, numbness & tingling, skin/hair/nail changes, depression, anxiety, abnormal bruising/bleeding, musculoskeletal symptoms/signs.     Objective:   Physical Exam General Appearance:    Alert, cooperative, no distress, appears stated age  Head:    Normocephalic, without obvious abnormality, atraumatic  Eyes:    PERRL, conjunctiva/corneas clear, EOM's intact, fundi    benign, both eyes       Ears:    Normal TM's and external ear canals, both ears  Nose:   Nares normal, septum midline, mucosa normal, no drainage   or sinus tenderness  Throat:   Lips, mucosa, and tongue normal; teeth and gums normal  Neck:   Supple, symmetrical, trachea midline, no adenopathy;       thyroid:  No enlargement/tenderness/nodules  Back:     Symmetric, no curvature, ROM normal, no CVA tenderness  Lungs:     Clear to auscultation bilaterally, respirations unlabored  Chest wall:    No tenderness or deformity  Heart:    Regular rate and rhythm, S1 and S2 normal, no murmur, rub   or gallop  Abdomen:     Soft, non-tender, bowel sounds active all four quadrants,    no masses, no organomegaly  Genitalia:    Deferred to urology  Rectal:    Extremities:   Extremities normal, atraumatic, no cyanosis or edema  Pulses:   2+ and symmetric all extremities  Skin:   Skin  color, texture, turgor normal, no rashes or lesions  Lymph nodes:   Cervical, supraclavicular, and axillary nodes normal  Neurologic:   CNII-XII intact. Normal strength, sensation and reflexes      throughout          Assessment & Plan:

## 2018-02-25 NOTE — Patient Instructions (Signed)
Follow up in 6 months to recheck BP and cholesterol We'll notify you of your lab results and make any changes if needed Continue to work on healthy diet and regular exercise- you can do it! Call with any questions or concerns Enjoy the rest of your summer!!!

## 2018-02-26 ENCOUNTER — Encounter: Payer: Self-pay | Admitting: General Practice

## 2018-02-27 ENCOUNTER — Other Ambulatory Visit: Payer: Self-pay | Admitting: Family Medicine

## 2018-03-04 ENCOUNTER — Other Ambulatory Visit (INDEPENDENT_AMBULATORY_CARE_PROVIDER_SITE_OTHER): Payer: 59

## 2018-03-04 DIAGNOSIS — E119 Type 2 diabetes mellitus without complications: Secondary | ICD-10-CM | POA: Diagnosis not present

## 2018-03-04 LAB — BASIC METABOLIC PANEL
BUN: 18 mg/dL (ref 6–23)
CHLORIDE: 98 meq/L (ref 96–112)
CO2: 29 mEq/L (ref 19–32)
Calcium: 10.4 mg/dL (ref 8.4–10.5)
Creatinine, Ser: 1.15 mg/dL (ref 0.40–1.50)
GFR: 69.39 mL/min (ref >60.00)
GLUCOSE: 129 mg/dL — AB (ref 70–99)
POTASSIUM: 4.2 meq/L (ref 3.5–5.1)
SODIUM: 135 meq/L (ref 135–145)

## 2018-03-04 LAB — MICROALBUMIN / CREATININE URINE RATIO
Creatinine,U: 116.3 mg/dL
Microalb Creat Ratio: 3.5 mg/g (ref 0.0–30.0)
Microalb, Ur: 4.1 mg/dL — ABNORMAL HIGH (ref 0.0–1.9)

## 2018-03-04 LAB — HEMOGLOBIN A1C: Hgb A1c MFr Bld: 6.6 % — ABNORMAL HIGH (ref 4.6–6.5)

## 2018-03-07 ENCOUNTER — Ambulatory Visit: Payer: 59 | Admitting: Family Medicine

## 2018-03-10 ENCOUNTER — Ambulatory Visit: Payer: 59 | Admitting: Endocrinology

## 2018-03-11 ENCOUNTER — Ambulatory Visit: Payer: 59 | Admitting: Family Medicine

## 2018-03-13 ENCOUNTER — Other Ambulatory Visit: Payer: Self-pay | Admitting: Family Medicine

## 2018-03-13 DIAGNOSIS — E119 Type 2 diabetes mellitus without complications: Secondary | ICD-10-CM

## 2018-03-13 DIAGNOSIS — I10 Essential (primary) hypertension: Secondary | ICD-10-CM

## 2018-04-06 ENCOUNTER — Other Ambulatory Visit: Payer: Self-pay | Admitting: Endocrinology

## 2018-04-07 ENCOUNTER — Other Ambulatory Visit: Payer: 59

## 2018-04-07 ENCOUNTER — Ambulatory Visit: Payer: 59 | Admitting: Family Medicine

## 2018-04-09 ENCOUNTER — Encounter: Payer: Self-pay | Admitting: Endocrinology

## 2018-04-09 ENCOUNTER — Ambulatory Visit: Payer: 59 | Admitting: Endocrinology

## 2018-04-09 ENCOUNTER — Other Ambulatory Visit: Payer: Self-pay | Admitting: Endocrinology

## 2018-04-09 VITALS — BP 130/76 | HR 76 | Resp 20 | Ht 70.0 in | Wt 226.2 lb

## 2018-04-09 DIAGNOSIS — E119 Type 2 diabetes mellitus without complications: Secondary | ICD-10-CM

## 2018-04-09 DIAGNOSIS — E1165 Type 2 diabetes mellitus with hyperglycemia: Secondary | ICD-10-CM

## 2018-04-09 DIAGNOSIS — I10 Essential (primary) hypertension: Secondary | ICD-10-CM | POA: Diagnosis not present

## 2018-04-09 NOTE — Patient Instructions (Addendum)
Check blood sugars on waking up 2-3/7    Also check blood sugars about 2 hours after a meal and do this after different meals by rotation  Recommended blood sugar levels on waking up is 90-130 and about 2 hours after meal is 130-160  Please bring your blood sugar monitor to each visit, thank you  Restart exercise

## 2018-04-09 NOTE — Progress Notes (Signed)
Patient ID: Kevin Barnes, male   DOB: 07/30/1960, 57 y.o.   MRN: [id number]            Reason for Appointment: Follow-up for Type 2 Diabetes  Referring physician: Birdie Riddle   History of Present Illness:          Date of diagnosis of type 2 diabetes mellitus:  2013      Background history:  He was diagnosed to have diabetes with a glucose of 132 in 2013 and baseline A1c of 6.4 He was started on metformin in 01/2013 when blood sugars were higher His blood sugars were reasonably controlled in the first 2 years or so, he was also given glipizide initially in 2015 but this caused hypoglycemia and he did not continue this. He had been followed up relatively sporadically He was also tried on Januvia subsequently in 2017 when his blood sugars were higher but this was changed to [place]  He did not take this because of the cost   A1c baseline in February 2018 was 9.2,  Recent history:   His A1c is 6.6 below 7% for the last 3 times   Non-insulin hypoglycemic drugs the patient is taking are:  Jardiance 25 mg daily pm , Metformin 1g bid, Amaryl 1 mg at suppertime  Current management, blood sugar patterns and problems identified:  He again did not bring his monitor for download  He has gained a little weight  He thinks that he has been inconsistent with his diet and blood sugar may be a little higher after meals  Also has not done any exercise except occasional walking, previously was going to the gym  His medication regimen has been continued unchanged including Jardiance he likes to take in the evening Lab glucose was 129 fasting       Side effects from medications have : None, tries to take metformin with food  Hypoglycemia:  he feels low sugars if the blood sugar is <90  Glucose monitoring:  done 1 times a day         Glucometer: One Touch Verio .       Blood Glucose readings by recall  Fasting 120-140; after meals <180   Self-care: The diet that the patient has been  following is: tries to limit Drinks with sugar .     Meal times are:  Breakfast is at 7 am Lunch: Variable Dinner: 7 pm    Typical meal intake: Breakfast is small.  He will have  some times a day biscuit late morning His snacks will be peanut butter crackers or chips.                Dietician visit, most recent: 11/19/16               Exercise:  Going walking couple of times a week  Weight history:  Wt Readings from Last 3 Encounters:  04/09/18 226 lb 4 oz (102.6 kg)  02/25/18 225 lb 3.2 oz (102.2 kg)  10/01/17 224 lb (101.6 kg)    Glycemic control:   Lab Results  Component Value Date   HGBA1C 6.6 (H) 03/04/2018   HGBA1C 6.5 09/24/2017   HGBA1C 6.3 05/28/2017   Lab Results  Component Value Date   MICROALBUR 4.1 (H) 03/04/2018   LDLCALC 71 02/25/2018   CREATININE 1.15 03/04/2018   Lab Results  Component Value Date   MICRALBCREAT 3.5 03/04/2018    Lab Results  Component Value Date   FRUCTOSAMINE 295 (H)  11/19/2016      Allergies as of 04/09/2018   No Known Allergies     Medication List        Accurate as of 04/09/18 10:55 AM. Always use your most recent med list.          aspirin 81 MG tablet Take 81 mg by mouth daily.   atorvastatin 20 MG tablet Commonly known as:  LIPITOR TAKE 1 TABLET BY MOUTH ONCE DAILY IN THE EVENING . APPOINTMENT REQUIRED FOR FUTURE REFILLS   B-12 5000 MCG Subl Place under the tongue.   butalbital-acetaminophen-caffeine 50-325-40 MG tablet Commonly known as:  FIORICET, ESGIC Take 1 tablet by mouth daily as needed for headache.   cholecalciferol 1000 units tablet Commonly known as:  VITAMIN D Take 2,000 Units by mouth daily.   empagliflozin 25 MG Tabs tablet Commonly known as:  JARDIANCE Take 25 mg by mouth daily.   fenofibrate 160 MG tablet TAKE 1 TABLET BY MOUTH ONCE DAILY   glimepiride 1 MG tablet Commonly known as:  AMARYL TAKE 1 TABLET BY MOUTH ONCE DAILY BEFORE  SUPPER   lisinopril-hydrochlorothiazide 20-25 MG  tablet Commonly known as:  PRINZIDE,ZESTORETIC TAKE 1 TABLET BY MOUTH ONCE DAILY   metFORMIN 1000 MG tablet Commonly known as:  GLUCOPHAGE Take 1 tablet (1,000 mg total) by mouth 2 (two) times daily with a meal.   Omega-3 Krill Oil 1000 MG Caps Take by mouth.   ONETOUCH VERIO test strip Generic drug:  glucose blood USE 1 STRIP 1 TO CHECK SUGAR 1-2 TIMES DAILY   OVER THE COUNTER MEDICATION Sunny Mood and Curamin   sildenafil 50 MG tablet Commonly known as:  VIAGRA TAKE TWO TABLETS BY MOUTH AS NEEDED FOR ERECTILE DYSFUNCTION       Allergies: No Known Allergies  Past Medical History:  Diagnosis Date  . Allergy   . Basal cell carcinoma of cheek 2016  . Diabetes mellitus   . Hyperlipidemia   . Hypertension   . Obesity   . Obesity     Past Surgical History:  Procedure Laterality Date  . PROSTATE SURGERY      Family History  Problem Relation Age of Onset  . Cancer Father        Prostate  . Heart disease Father   . Hyperlipidemia Father   . Hypertension Father   . Stroke Paternal Grandfather   . Diabetes Neg Hx     Social History:  reports that he has quit smoking. He has never used smokeless tobacco. He reports that he drinks about 1.0 standard drinks of alcohol per week. He reports that he does not use drugs.   Review of Systems   Lipid history: He has had persistently high triglycerides with control in the past He is taking fenofibrate in addition to his Lipitor LDL excellent but was better on the previous visit's   Lab Results  Component Value Date   CHOL 149 02/25/2018   HDL 49.60 02/25/2018   LDLCALC 71 02/25/2018   LDLDIRECT 111.0 06/24/2017   TRIG 143.0 02/25/2018   CHOLHDL 3 02/25/2018           Hypertension: Has had this for several years, taking lisinopril HCTZ from his PCP and was seen recently However not checking blood pressure at home lately Diastolic has been higher recently   BP Readings from Last 3 Encounters:  04/09/18 130/76    02/25/18 130/86  10/01/17 136/80   Hypercalcemia: His calcium was high on his initial visit, tends  to be upper normal most of the time PTH is not elevated  He does take HCTZ  Most recent eye exam was 1/18  Most recent foot exam:4/18    LABS:  No visits with results within 1 Week(s) from this visit.  Latest known visit with results is:  Lab on 03/04/2018  Component Date Value Ref Range Status  . Microalb, Ur 03/04/2018 4.1* 0.0 - 1.9 mg/dL Final  . Creatinine,U 03/04/2018 116.3  mg/dL Final  . Microalb Creat Ratio 03/04/2018 3.5  0.0 - 30.0 mg/g Final  . Sodium 03/04/2018 135  135 - 145 mEq/L Final  . Potassium 03/04/2018 4.2  3.5 - 5.1 mEq/L Final  . Chloride 03/04/2018 98  96 - 112 mEq/L Final  . CO2 03/04/2018 29  19 - 32 mEq/L Final  . Glucose, Bld 03/04/2018 129* 70 - 99 mg/dL Final  . BUN 03/04/2018 18  6 - 23 mg/dL Final  . Creatinine, Ser 03/04/2018 1.15  0.40 - 1.50 mg/dL Final  . Calcium 03/04/2018 10.4  8.4 - 10.5 mg/dL Final  . GFR 03/04/2018 69.39  >60.00 mL/min Final  . Hgb A1c MFr Bld 03/04/2018 6.6* 4.6 - 6.5 % Final   Glycemic Control Guidelines for People with Diabetes:Non Diabetic:  <6%Goal of Therapy: <7%Additional Action Suggested:  >8%     Physical Examination:  BP 130/76   Pulse 76   Resp 20   Ht 5\' 10"  (1.778 m)   Wt 226 lb 4 oz (102.6 kg)   SpO2 98%   BMI 32.46 kg/m   No edema present    ASSESSMENT:  Diabetes type 2, recent BMI 32  See history of present illness for detailed discussion of current diabetes management, blood sugar patterns and problems identified  His A1c is 6.6  Although he has not probably checked his blood sugar enough at home and did not bring his meter again he only has some increase in blood sugars fasting and at times after meals Overall can benefit from more consistent diet and controlling portions which he thinks he can do better with Also not doing much formal exercise Consistently well controlled with using  Jardiance in addition to his metformin and low-dose Amaryl His weight is gradually coming down He is generally motivated to watch his portions and carbohydrates More recently trying to exercise at least couple of times a week also and this is helping   HYPERCALCEMIA: Calcium is still upper normal, may be related to taking HCTZ since PTH is low normal   Hypertension: Diastolic is increased at times, needs to check blood pressure at home   PLAN:   Regular exercise, either more walking or go to the gym Watch portions better Discussed need for weight loss to prevent progression of his diabetes  More consistent monitoring after meals and bring monitor on the next visit To call if blood sugars are consistently high at any time Follow-up in 6 months  Patient Instructions  Check blood sugars on waking up 2-3/7    Also check blood sugars about 2 hours after a meal and do this after different meals by rotation  Recommended blood sugar levels on waking up is 90-130 and about 2 hours after meal is 130-160  Please bring your blood sugar monitor to each visit, thank you  Restart exercise      Elayne Snare 04/09/2018, 10:55 AM   Note: This office note was prepared with Dragon voice recognition system technology. Any transcriptional errors that result from this process  are unintentional.

## 2018-04-26 ENCOUNTER — Other Ambulatory Visit: Payer: Self-pay | Admitting: Endocrinology

## 2018-06-11 ENCOUNTER — Other Ambulatory Visit: Payer: Self-pay | Admitting: Family Medicine

## 2018-06-17 ENCOUNTER — Ambulatory Visit: Payer: 59 | Admitting: Internal Medicine

## 2018-08-05 ENCOUNTER — Ambulatory Visit: Payer: 59 | Admitting: Internal Medicine

## 2018-08-05 ENCOUNTER — Encounter: Payer: Self-pay | Admitting: Internal Medicine

## 2018-08-05 VITALS — BP 144/90 | HR 77 | Ht 70.0 in | Wt 229.0 lb

## 2018-08-05 DIAGNOSIS — E119 Type 2 diabetes mellitus without complications: Secondary | ICD-10-CM | POA: Diagnosis not present

## 2018-08-05 DIAGNOSIS — I4510 Unspecified right bundle-branch block: Secondary | ICD-10-CM

## 2018-08-05 DIAGNOSIS — E782 Mixed hyperlipidemia: Secondary | ICD-10-CM

## 2018-08-05 DIAGNOSIS — I10 Essential (primary) hypertension: Secondary | ICD-10-CM

## 2018-08-05 NOTE — Patient Instructions (Signed)
Medication Instructions:  Continue current medications   Follow-Up: You will need a follow up appointment as needed with Dr. Debara Pickett

## 2018-08-05 NOTE — Progress Notes (Signed)
LIPID CLINIC CONSULT NOTE  Chief Complaint:  Manage dyslipidemia  Primary Care Physician: Midge Minium, MD  HPI:  Kevin Barnes is a 58 y.o. male who is being seen today for the evaluation of dyslipidemia at the request of Tabori, Aundra Millet, MD.  This is a pleasant 58 year old male with past medical history significant for type 2 diabetes, dyslipidemia, hypertension and family history of coronary disease in his father starting in his 60s.  He is currently referred for evaluation and management of dyslipidemia.  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  He denies any history of cigarette use, drinks minimal alcohol denies any drug use.  He is fairly sedentary but previously was very active prior to taking a desk job.  Most recently a lipid profile was performed which showed total cholesterol 149, triglycerides 143, HDL 49 and LDL 71.  Overall this is a very favorable profile with goal LDL less than 70 in my estimation given his diabetes and additional risk factors.  He is on a atorvastatin 20 mg daily as well as fenofibrate 160 mg daily.  He does seem to tolerate both medications without any significant adverse side effects.  With regard to his diabetes hemoglobin A1c has been well controlled in the low sixes.  He is on Jardiance, Amaryl and metformin.  PMHx:  Past Medical History:  Diagnosis Date  . Allergy   . Basal cell carcinoma of cheek 2016  . Diabetes mellitus   . Hyperlipidemia   . Hypertension   . Obesity   . Obesity     Past Surgical History:  Procedure Laterality Date  . PROSTATE SURGERY      FAMHx:  Family History  Problem Relation Age of Onset  . Cancer Father        Prostate  . Heart disease Father   . Hyperlipidemia Father   . Hypertension Father   . Stroke Paternal Grandfather   . Diabetes Neg Hx     SOCHx:   reports that he has quit smoking. He has never used smokeless tobacco. He reports current alcohol use  of about 1.0 standard drinks of alcohol per week. He reports that he does not use drugs.  ALLERGIES:  No Known Allergies  ROS: Pertinent items noted in HPI and remainder of comprehensive ROS otherwise negative.  HOME MEDS: Current Outpatient Medications on File Prior to Visit  Medication Sig Dispense Refill  . aspirin 81 MG tablet Take 81 mg by mouth daily.    Marland Kitchen atorvastatin (LIPITOR) 20 MG tablet TAKE 1 TABLET BY MOUTH ONCE DAILY IN THE EVENING . APPOINTMENT REQUIRED FOR FUTURE REFILLS 30 tablet 3  . butalbital-acetaminophen-caffeine (FIORICET, ESGIC) 50-325-40 MG tablet Take 1 tablet by mouth daily as needed for headache. 30 tablet 0  . cholecalciferol (VITAMIN D) 1000 units tablet Take 2,000 Units by mouth daily.    . Cyanocobalamin (B-12) 5000 MCG SUBL Place under the tongue.    . fenofibrate 160 MG tablet TAKE 1 TABLET BY MOUTH ONCE DAILY 90 tablet 1  . glimepiride (AMARYL) 1 MG tablet TAKE 1 TABLET BY MOUTH ONCE DAILY BEFORE  SUPPER 90 tablet 1  . JARDIANCE 25 MG TABS tablet TAKE 1 TABLET BY MOUTH ONCE DAILY 90 tablet 1  . lisinopril-hydrochlorothiazide (PRINZIDE,ZESTORETIC) 20-25 MG tablet TAKE 1 TABLET BY MOUTH ONCE DAILY 90 tablet 1  . metFORMIN (GLUCOPHAGE) 1000 MG tablet TAKE 1 TABLET BY MOUTH TWICE DAILY WITH A MEAL 180  tablet 1  . Omega-3 Krill Oil 1000 MG CAPS Take by mouth.    Glory Rosebush VERIO test strip USE 1 STRIP 1 TO CHECK SUGAR 1-2 TIMES DAILY 200 each 2  . OVER THE COUNTER MEDICATION Sunny Mood and Curamin    . sildenafil (VIAGRA) 50 MG tablet TAKE TWO TABLETS BY MOUTH AS NEEDED FOR ERECTILE DYSFUNCTION 6 tablet 1   No current facility-administered medications on file prior to visit.     LABS/IMAGING: No results found for this or any previous visit (from the past 48 hour(s)). No results found.  LIPID PANEL:    Component Value Date/Time   CHOL 149 02/25/2018 0938   TRIG 143.0 02/25/2018 0938   HDL 49.60 02/25/2018 0938   CHOLHDL 3 02/25/2018 0938   VLDL  28.6 02/25/2018 0938   LDLCALC 71 02/25/2018 0938   LDLDIRECT 111.0 06/24/2017 1023    WEIGHTS: Wt Readings from Last 3 Encounters:  08/05/18 229 lb (103.9 kg)  04/09/18 226 lb 4 oz (102.6 kg)  02/25/18 225 lb 3.2 oz (102.2 kg)    VITALS: BP (!) 144/90 (BP Location: Left Arm, Patient Position: Sitting, Cuff Size: Normal)   Pulse 77   Ht 5\' 10"  (1.778 m)   Wt 229 lb (103.9 kg)   BMI 32.86 kg/m   EXAM: General appearance: alert and no distress Neck: no carotid bruit, no JVD and thyroid not enlarged, symmetric, no tenderness/mass/nodules Lungs: clear to auscultation bilaterally Heart: regular rate and rhythm, S1, S2 normal, no murmur, click, rub or gallop Abdomen: soft, non-tender; bowel sounds normal; no masses,  no organomegaly Extremities: extremities normal, atraumatic, no cyanosis or edema Pulses: 2+ and symmetric Skin: Skin color, texture, turgor normal. No rashes or lesions Neurologic: Grossly normal Psych: Pleasant  EKG: Sinus rhythm at 77, RBBB, inferior and lateral T wave changes- personally reviewed  ASSESSMENT: 1. Mixed dyslipidemia 2. Type 2 diabetes 3. Hypertension 4. RBBB 5. Family history of premature onset coronary disease  PLAN: 1.   Mr. Eisenberger has a family history of premature onset coronary disease, type 2 diabetes and other cardiovascular risk factors putting him at intermediate to high risk for heart disease.  His goal LDL is less than 70 and he is almost at that currently.  He is on moderate intensity statin and has no known coronary disease.  He also takes fenofibrate which may be minimally beneficial for his hyperglycemia however no clear long-term outcome data is associated with with a triglyceride reduction.  Since he already meets guidelines for intensive therapy, there is really no role for calcium scoring and as he is asymptomatic, stress testing is not indicated.  He apparently had stress testing about 10 years ago or longer which show was  nonischemic.  This was ordered due to an abnormal right bundle branch block noted on EKG which is still apparent today..  I have encouraged him to try to get more regular physical exercise which could further reduce his risk.  I would not recommend any further changes to his medications at this time.  Thanks again for the kind referral.  Follow-up with me can be as needed.  Pixie Casino, MD, [facility], [place] Director of the [facility]  [facility] Diplomate of the American Board of Clinical Lipidology Attending Cardiologist  Direct Dial: [phone]  Fax: [phone]  Website:  [email or web address] 08/05/2018, 9:21 AM

## 2018-08-25 ENCOUNTER — Encounter: Payer: Self-pay | Admitting: Family Medicine

## 2018-08-25 ENCOUNTER — Other Ambulatory Visit: Payer: Self-pay

## 2018-08-25 ENCOUNTER — Ambulatory Visit: Payer: 59 | Admitting: Family Medicine

## 2018-08-25 ENCOUNTER — Encounter: Payer: Self-pay | Admitting: General Practice

## 2018-08-25 VITALS — BP 134/80 | HR 78 | Temp 98.1°F | Resp 16 | Ht 70.0 in | Wt 229.5 lb

## 2018-08-25 DIAGNOSIS — E785 Hyperlipidemia, unspecified: Secondary | ICD-10-CM

## 2018-08-25 DIAGNOSIS — I10 Essential (primary) hypertension: Secondary | ICD-10-CM | POA: Diagnosis not present

## 2018-08-25 DIAGNOSIS — E669 Obesity, unspecified: Secondary | ICD-10-CM | POA: Insufficient documentation

## 2018-08-25 DIAGNOSIS — E119 Type 2 diabetes mellitus without complications: Secondary | ICD-10-CM

## 2018-08-25 DIAGNOSIS — E1169 Type 2 diabetes mellitus with other specified complication: Secondary | ICD-10-CM | POA: Diagnosis not present

## 2018-08-25 LAB — CBC WITH DIFFERENTIAL/PLATELET
Basophils Absolute: 0.1 10*3/uL (ref 0.0–0.1)
Basophils Relative: 1.2 % (ref 0.0–3.0)
Eosinophils Absolute: 0.6 10*3/uL (ref 0.0–0.7)
Eosinophils Relative: 6.9 % — ABNORMAL HIGH (ref 0.0–5.0)
HCT: 43.6 % (ref 39.0–52.0)
Hemoglobin: 15.1 g/dL (ref 13.0–17.0)
LYMPHS ABS: 2.8 10*3/uL (ref 0.7–4.0)
Lymphocytes Relative: 34.7 % (ref 12.0–46.0)
MCHC: 34.6 g/dL (ref 30.0–36.0)
MCV: 92.4 fl (ref 78.0–100.0)
Monocytes Absolute: 0.6 10*3/uL (ref 0.1–1.0)
Monocytes Relative: 7.7 % (ref 3.0–12.0)
Neutro Abs: 3.9 10*3/uL (ref 1.4–7.7)
Neutrophils Relative %: 49.5 % (ref 43.0–77.0)
Platelets: 346 10*3/uL (ref 150.0–400.0)
RBC: 4.72 Mil/uL (ref 4.22–5.81)
RDW: 12.6 % (ref 11.5–15.5)
WBC: 8 10*3/uL (ref 4.0–10.5)

## 2018-08-25 LAB — HEPATIC FUNCTION PANEL
ALT: 37 U/L (ref 0–53)
AST: 21 U/L (ref 0–37)
Albumin: 4.5 g/dL (ref 3.5–5.2)
Alkaline Phosphatase: 42 U/L (ref 39–117)
BILIRUBIN TOTAL: 0.4 mg/dL (ref 0.2–1.2)
Bilirubin, Direct: 0.1 mg/dL (ref 0.0–0.3)
Total Protein: 7.3 g/dL (ref 6.0–8.3)

## 2018-08-25 LAB — TSH: TSH: 0.69 u[IU]/mL (ref 0.35–4.50)

## 2018-08-25 LAB — BASIC METABOLIC PANEL
BUN: 15 mg/dL (ref 6–23)
CO2: 28 mEq/L (ref 19–32)
Calcium: 10.3 mg/dL (ref 8.4–10.5)
Chloride: 99 mEq/L (ref 96–112)
Creatinine, Ser: 1.03 mg/dL (ref 0.40–1.50)
GFR: 74.02 mL/min (ref >60.00)
Glucose, Bld: 135 mg/dL — ABNORMAL HIGH (ref 70–99)
Potassium: 4.3 mEq/L (ref 3.5–5.1)
Sodium: 136 mEq/L (ref 135–145)

## 2018-08-25 LAB — LIPID PANEL
CHOLESTEROL: 132 mg/dL (ref 0–200)
HDL: 40.2 mg/dL (ref >39.00)
LDL CALC: 62 mg/dL (ref 0–99)
NonHDL: 91.79
TRIGLYCERIDES: 147 mg/dL (ref 0.0–149.0)
Total CHOL/HDL Ratio: 3
VLDL: 29.4 mg/dL (ref 0.0–40.0)

## 2018-08-25 NOTE — Assessment & Plan Note (Signed)
Chronic problem, following w/ Dr Dwyane Dee.  Due for eye exam- encouraged to schedule.

## 2018-08-25 NOTE — Patient Instructions (Addendum)
Schedule your complete physical in 6 months We'll notify you of your lab results and make any changes if needed Continue to work on healthy diet and regular exercise- you can do it! Schedule your eye exam at your convenience!!! Call with any questions or concerns Happy Valentine's Day!

## 2018-08-25 NOTE — Assessment & Plan Note (Signed)
Chronic problem.  Tolerating statin w/o difficulty.  Check labs.  Adjust meds prn  

## 2018-08-25 NOTE — Assessment & Plan Note (Signed)
Ongoing issue for pt.  Pt has gained 5 lbs.  Stressed need for healthy diet and regular exercise.  Will follow.

## 2018-08-25 NOTE — Assessment & Plan Note (Signed)
Chronic problem.  Adequate control today.  Asymptomatic.  Check labs.  No anticipated med changes.  Will follow. 

## 2018-08-25 NOTE — Progress Notes (Signed)
   Subjective:    Patient ID: Kevin Barnes, male    DOB: 1961-01-03, 58 y.o.   MRN: [id number]  HPI DM- chronic problem, following w/ Dr Dwyane Dee.  Appt upcoming.  UTD on foot exam, due for eye exam.  On ACE for renal protection.  HTN- chronic problem, on Lisinopril HCTZ 20/25mg  daily w/ good control.  No CP, SOB, HAs, visual changes, edema.  Hyperlipidemia- chronic problem, on Lipitor 20mg  daily and Fenofibrate 160mg .  Pt has gained 5 lbs since last visit.  No regular exercise.  No abd pain, N/V   Review of Systems For ROS see HPI     Objective:   Physical Exam Vitals signs reviewed.  Constitutional:      General: He is not in acute distress.    Appearance: He is well-developed.  HENT:     Head: Normocephalic and atraumatic.  Eyes:     Conjunctiva/sclera: Conjunctivae normal.     Pupils: Pupils are equal, round, and reactive to light.  Neck:     Musculoskeletal: Normal range of motion and neck supple.     Thyroid: No thyromegaly.  Cardiovascular:     Rate and Rhythm: Normal rate and regular rhythm.     Heart sounds: Normal heart sounds. No murmur.  Pulmonary:     Effort: Pulmonary effort is normal. No respiratory distress.     Breath sounds: Normal breath sounds.  Abdominal:     General: Bowel sounds are normal. There is no distension.     Palpations: Abdomen is soft.  Lymphadenopathy:     Cervical: No cervical adenopathy.  Skin:    General: Skin is warm and dry.  Neurological:     Mental Status: He is alert and oriented to person, place, and time.     Cranial Nerves: No cranial nerve deficit.  Psychiatric:        Behavior: Behavior normal.           Assessment & Plan:

## 2018-09-10 ENCOUNTER — Other Ambulatory Visit: Payer: Self-pay | Admitting: Family Medicine

## 2018-09-10 DIAGNOSIS — E119 Type 2 diabetes mellitus without complications: Secondary | ICD-10-CM

## 2018-09-10 DIAGNOSIS — I10 Essential (primary) hypertension: Secondary | ICD-10-CM

## 2018-09-14 ENCOUNTER — Encounter: Payer: Self-pay | Admitting: Family Medicine

## 2018-09-14 DIAGNOSIS — N529 Male erectile dysfunction, unspecified: Secondary | ICD-10-CM

## 2018-09-15 MED ORDER — SILDENAFIL CITRATE 50 MG PO TABS: ORAL_TABLET | ORAL | 4 refills | Status: DC

## 2018-10-06 ENCOUNTER — Other Ambulatory Visit (INDEPENDENT_AMBULATORY_CARE_PROVIDER_SITE_OTHER): Payer: 59

## 2018-10-06 ENCOUNTER — Other Ambulatory Visit: Payer: Self-pay

## 2018-10-06 DIAGNOSIS — E1165 Type 2 diabetes mellitus with hyperglycemia: Secondary | ICD-10-CM | POA: Diagnosis not present

## 2018-10-06 LAB — HEMOGLOBIN A1C: Hgb A1c MFr Bld: 6.6 % — ABNORMAL HIGH (ref 4.6–6.5)

## 2018-10-06 LAB — BASIC METABOLIC PANEL
BUN: 18 mg/dL (ref 6–23)
CALCIUM: 9.7 mg/dL (ref 8.4–10.5)
CO2: 28 meq/L (ref 19–32)
Chloride: 98 mEq/L (ref 96–112)
Creatinine, Ser: 1.13 mg/dL (ref 0.40–1.50)
GFR: 66.49 mL/min (ref >60.00)
Glucose, Bld: 134 mg/dL — ABNORMAL HIGH (ref 70–99)
Potassium: 3.7 mEq/L (ref 3.5–5.1)
Sodium: 135 mEq/L (ref 135–145)

## 2018-10-10 ENCOUNTER — Other Ambulatory Visit: Payer: Self-pay

## 2018-10-12 NOTE — Progress Notes (Signed)
Patient ID: Kevin Barnes, male   DOB: October 23, 1960, 58 y.o.   MRN: [id number]            Reason for Appointment: Follow-up for Type 2 Diabetes  Referring physician: Birdie Riddle   History of Present Illness:          Date of diagnosis of type 2 diabetes mellitus:  2013      Background history:  He was diagnosed to have diabetes with a glucose of 132 in 2013 and baseline A1c of 6.4 He was started on metformin in 01/2013 when blood sugars were higher His blood sugars were reasonably controlled in the first 2 years or so, he was also given glipizide initially in 2015 but this caused hypoglycemia and he did not continue this. He had been followed up relatively sporadically He was also tried on Januvia subsequently in 2017 when his blood sugars were higher but this was changed to [place]  He did not take this because of the cost   A1c baseline in February 2018 was 9.2,  Recent history:   His A1c is 6.6 again and below 7% for the last 3 times   Non-insulin hypoglycemic drugs the patient is taking are:  Jardiance 25 mg daily pm , Metformin 1g bid, Amaryl 1 mg at suppertime  Current management, blood sugar patterns and problems identified:  He finally did not bring his monitor for download and review  He continues to do fairly well overall with his control  Again he has a tendency to mildly increased fasting readings averaging about 130  Blood sugars are relatively better the rest of the morning and afternoon but he forgets to check his readings after dinner  He says he is walking occasionally but not on a regular basis  Also generally trying to watch his diet and recently highest blood sugar is only 161  No hypoglycemia with Amaryl Lab glucose was 135 fasting higher than the reading at home earlier that morning of 113, his test strips are not expired       Side effects from medications have : None, tries to take metformin with food  Hypoglycemia:  he feels low sugars if the blood  sugar is <90  Glucose monitoring:  done 1 times a day         Glucometer: One Touch Verio .       Blood Glucose readings by download    PRE-MEAL Fasting Lunch Dinner Bedtime Overall  Glucose range:  113-146      Mean/median:  130  113    113   POST-MEAL PC Breakfast PC Lunch PC Dinner  Glucose range:  94-134    Mean/median:   107      Self-care: The diet that the patient has been following is: tries to limit Drinks with sugar .     Meal times are:  Breakfast is at 7 am Lunch: Variable Dinner: 7 pm    Typical meal intake: Breakfast is small.  He will have  some times a day biscuit late morning His snacks will be peanut butter crackers or chips.                Dietician visit, most recent: 11/19/16               Exercise:  walking couple of times a week  Weight history:  Wt Readings from Last 3 Encounters:  10/13/18 227 lb 6.4 oz (103.1 kg)  08/25/18 229 lb 8 oz (104.1 kg)  08/05/18 229 lb (103.9 kg)    Glycemic control:   Lab Results  Component Value Date   HGBA1C 6.6 (H) 10/06/2018   HGBA1C 6.6 (H) 03/04/2018   HGBA1C 6.5 09/24/2017   Lab Results  Component Value Date   MICROALBUR 4.1 (H) 03/04/2018   LDLCALC 62 08/25/2018   CREATININE 1.13 10/06/2018   Lab Results  Component Value Date   MICRALBCREAT 3.5 03/04/2018    Lab Results  Component Value Date   FRUCTOSAMINE 295 (H) 11/19/2016      Allergies as of 10/13/2018   No Known Allergies     Medication List       Accurate as of October 13, 2018  4:02 PM. Always use your most recent med list.        aspirin 81 MG tablet Take 81 mg by mouth daily.   atorvastatin 20 MG tablet Commonly known as:  LIPITOR TAKE 1 TABLET BY MOUTH ONCE DAILY IN THE EVENING . APPOINTMENT REQUIRED FOR FUTURE REFILLS   B-12 5000 MCG Subl Place under the tongue.   butalbital-acetaminophen-caffeine 50-325-40 MG tablet Commonly known as:  FIORICET, ESGIC Take 1 tablet by mouth daily as needed for headache.    cholecalciferol 1000 units tablet Commonly known as:  VITAMIN D Take 2,000 Units by mouth daily.   fenofibrate 160 MG tablet TAKE 1 TABLET BY MOUTH ONCE DAILY   glimepiride 1 MG tablet Commonly known as:  AMARYL TAKE 1 TABLET BY MOUTH ONCE DAILY BEFORE  SUPPER   Jardiance 25 MG Tabs tablet Generic drug:  empagliflozin TAKE 1 TABLET BY MOUTH ONCE DAILY   lisinopril-hydrochlorothiazide 20-25 MG tablet Commonly known as:  PRINZIDE,ZESTORETIC TAKE 1 TABLET BY MOUTH ONCE DAILY   metFORMIN 1000 MG tablet Commonly known as:  GLUCOPHAGE TAKE 1 TABLET BY MOUTH TWICE DAILY WITH A MEAL   Omega-3 Krill Oil 1000 MG Caps Take by mouth.   OneTouch Verio test strip Generic drug:  glucose blood USE 1 STRIP 1 TO CHECK SUGAR 1-2 TIMES DAILY   OVER THE COUNTER MEDICATION Sunny Mood and Curamin   sildenafil 50 MG tablet Commonly known as:  Viagra TAKE TWO TABLETS BY MOUTH AS NEEDED FOR ERECTILE DYSFUNCTION       Allergies: No Known Allergies  Past Medical History:  Diagnosis Date  . Allergy   . Basal cell carcinoma of cheek 2016  . Diabetes mellitus   . Hyperlipidemia   . Hypertension   . Obesity   . Obesity     Past Surgical History:  Procedure Laterality Date  . PROSTATE SURGERY      Family History  Problem Relation Age of Onset  . Cancer Father        Prostate  . Heart disease Father   . Hyperlipidemia Father   . Hypertension Father   . Stroke Paternal Grandfather   . Diabetes Neg Hx     Social History:  reports that he has quit smoking. He has never used smokeless tobacco. He reports current alcohol use of about 1.0 standard drinks of alcohol per week. He reports that he does not use drugs.   Review of Systems   Lipid history: He has had persistently high triglycerides with control in the past He is taking fenofibrate in addition to his Lipitor Also has been seen by cardiologist for lipids   Lab Results  Component Value Date   CHOL 132 08/25/2018    HDL 40.20 08/25/2018   LDLCALC 62 08/25/2018   LDLDIRECT 111.0  06/24/2017   TRIG 147.0 08/25/2018   CHOLHDL 3 08/25/2018           Hypertension: Has had this for several years, taking lisinopril HCTZ from his PCP   However not checking blood pressure at home lately  Blood pressure was lower on his second measurement   BP Readings from Last 3 Encounters:  10/13/18 122/78  08/25/18 134/80  08/05/18 (!) 144/90   Hypercalcemia: His calcium was high on his initial visit only PTH is not elevated  He does take HCTZ  Most recent eye exam was 1/18  Most recent foot exam:4/18    LABS:  No visits with results within 1 Week(s) from this visit.  Latest known visit with results is:  Lab on 10/06/2018  Component Date Value Ref Range Status  . Sodium 10/06/2018 135  135 - 145 mEq/L Final  . Potassium 10/06/2018 3.7  3.5 - 5.1 mEq/L Final  . Chloride 10/06/2018 98  96 - 112 mEq/L Final  . CO2 10/06/2018 28  19 - 32 mEq/L Final  . Glucose, Bld 10/06/2018 134* 70 - 99 mg/dL Final  . BUN 10/06/2018 18  6 - 23 mg/dL Final  . Creatinine, Ser 10/06/2018 1.13  0.40 - 1.50 mg/dL Final  . Calcium 10/06/2018 9.7  8.4 - 10.5 mg/dL Final  . GFR 10/06/2018 66.49  >60.00 mL/min Final  . Hgb A1c MFr Bld 10/06/2018 6.6* 4.6 - 6.5 % Final   Glycemic Control Guidelines for People with Diabetes:Non Diabetic:  <6%Goal of Therapy: <7%Additional Action Suggested:  >8%     Physical Examination:  BP 122/78   Pulse 76   Temp 98.8 F (37.1 C) (Oral)   Ht 5\' 10"  (1.778 m)   Wt 227 lb 6.4 oz (103.1 kg)   SpO2 98%   BMI 32.63 kg/m       ASSESSMENT:  Diabetes type 2, recent BMI 32  See history of present illness for detailed discussion of current diabetes management, blood sugar patterns and problems identified  His A1c is 6.6 and stable  He is on a multidrug regimen including Jardiance  He has done well although his fasting readings tend to be high normal or slightly high especially with  lab glucose being slightly higher than his home readings He may possibly have high readings after supper which he is not monitoring  Hypertension: Well controlled   PLAN:   He does need to do much better with exercise and this was discussed again as before This should help his level of control, is also other risk factors If he has high readings after evening meal he will let us know If he has any hypoglycemia may need to reduce Amaryl  Follow-up in 6 months  Patient Instructions  Walk 40 min, 4 days per week  Check blood sugars on waking up 3 days a week  Also check blood sugars about 2 hours after meals and do this after different meals by rotation  Recommended blood sugar levels on waking up are 90-130 and about 2 hours after meal is 130-160  Please bring your blood sugar monitor to each visit, thank you      Elayne Snare 10/13/2018, 4:02 PM   Note: This office note was prepared with Dragon voice recognition system technology. Any transcriptional errors that result from this process are unintentional.

## 2018-10-13 ENCOUNTER — Ambulatory Visit: Payer: 59 | Admitting: Endocrinology

## 2018-10-13 ENCOUNTER — Encounter: Payer: Self-pay | Admitting: Endocrinology

## 2018-10-13 ENCOUNTER — Other Ambulatory Visit: Payer: Self-pay

## 2018-10-13 VITALS — BP 122/78 | HR 76 | Temp 98.8°F | Ht 70.0 in | Wt 227.4 lb

## 2018-10-13 DIAGNOSIS — E119 Type 2 diabetes mellitus without complications: Secondary | ICD-10-CM

## 2018-10-13 NOTE — Patient Instructions (Addendum)
Walk 40 min, 4 days per week  Check blood sugars on waking up 3 days a week  Also check blood sugars about 2 hours after meals and do this after different meals by rotation  Recommended blood sugar levels on waking up are 90-130 and about 2 hours after meal is 130-160  Please bring your blood sugar monitor to each visit, thank you

## 2018-10-14 ENCOUNTER — Other Ambulatory Visit: Payer: Self-pay | Admitting: Endocrinology

## 2018-10-14 DIAGNOSIS — E119 Type 2 diabetes mellitus without complications: Secondary | ICD-10-CM

## 2018-10-30 ENCOUNTER — Other Ambulatory Visit: Payer: Self-pay | Admitting: Endocrinology

## 2018-11-12 ENCOUNTER — Other Ambulatory Visit: Payer: Self-pay | Admitting: Endocrinology

## 2018-12-15 ENCOUNTER — Other Ambulatory Visit: Payer: Self-pay | Admitting: Endocrinology

## 2018-12-15 ENCOUNTER — Other Ambulatory Visit: Payer: Self-pay | Admitting: Family Medicine

## 2018-12-15 DIAGNOSIS — E119 Type 2 diabetes mellitus without complications: Secondary | ICD-10-CM

## 2018-12-16 ENCOUNTER — Other Ambulatory Visit: Payer: Self-pay | Admitting: Family Medicine

## 2018-12-16 DIAGNOSIS — E119 Type 2 diabetes mellitus without complications: Secondary | ICD-10-CM

## 2018-12-16 DIAGNOSIS — I10 Essential (primary) hypertension: Secondary | ICD-10-CM

## 2019-02-13 ENCOUNTER — Other Ambulatory Visit: Payer: Self-pay | Admitting: Endocrinology

## 2019-03-02 ENCOUNTER — Other Ambulatory Visit: Payer: Self-pay

## 2019-03-02 ENCOUNTER — Encounter: Payer: Self-pay | Admitting: Family Medicine

## 2019-03-02 ENCOUNTER — Ambulatory Visit (INDEPENDENT_AMBULATORY_CARE_PROVIDER_SITE_OTHER): Payer: 59 | Admitting: Family Medicine

## 2019-03-02 VITALS — BP 118/81 | HR 71 | Temp 98.0°F | Resp 16 | Ht 70.0 in | Wt 225.5 lb

## 2019-03-02 DIAGNOSIS — E119 Type 2 diabetes mellitus without complications: Secondary | ICD-10-CM

## 2019-03-02 DIAGNOSIS — Z0000 Encounter for general adult medical examination without abnormal findings: Secondary | ICD-10-CM

## 2019-03-02 DIAGNOSIS — E669 Obesity, unspecified: Secondary | ICD-10-CM

## 2019-03-02 DIAGNOSIS — Z125 Encounter for screening for malignant neoplasm of prostate: Secondary | ICD-10-CM

## 2019-03-02 LAB — CBC WITH DIFFERENTIAL/PLATELET
Basophils Absolute: 0 10*3/uL (ref 0.0–0.1)
Basophils Relative: 0.2 % (ref 0.0–3.0)
Eosinophils Absolute: 0.4 10*3/uL (ref 0.0–0.7)
Eosinophils Relative: 5.8 % — ABNORMAL HIGH (ref 0.0–5.0)
HCT: 42.8 % (ref 39.0–52.0)
Hemoglobin: 14.7 g/dL (ref 13.0–17.0)
Lymphocytes Relative: 35.3 % (ref 12.0–46.0)
Lymphs Abs: 2.7 10*3/uL (ref 0.7–4.0)
MCHC: 34.4 g/dL (ref 30.0–36.0)
MCV: 92.3 fl (ref 78.0–100.0)
Monocytes Absolute: 0.6 10*3/uL (ref 0.1–1.0)
Monocytes Relative: 8.2 % (ref 3.0–12.0)
Neutro Abs: 3.9 10*3/uL (ref 1.4–7.7)
Neutrophils Relative %: 50.5 % (ref 43.0–77.0)
Platelets: 353 10*3/uL (ref 150.0–400.0)
RBC: 4.64 Mil/uL (ref 4.22–5.81)
RDW: 12.4 % (ref 11.5–15.5)
WBC: 7.7 10*3/uL (ref 4.0–10.5)

## 2019-03-02 LAB — HEPATIC FUNCTION PANEL
ALT: 32 U/L (ref 0–53)
AST: 21 U/L (ref 0–37)
Albumin: 4.5 g/dL (ref 3.5–5.2)
Alkaline Phosphatase: 43 U/L (ref 39–117)
Bilirubin, Direct: 0.1 mg/dL (ref 0.0–0.3)
Total Bilirubin: 0.5 mg/dL (ref 0.2–1.2)
Total Protein: 7.2 g/dL (ref 6.0–8.3)

## 2019-03-02 LAB — LIPID PANEL
Cholesterol: 136 mg/dL (ref 0–200)
HDL: 45 mg/dL (ref >39.00)
LDL Cholesterol: 63 mg/dL (ref 0–99)
NonHDL: 91.19
Total CHOL/HDL Ratio: 3
Triglycerides: 140 mg/dL (ref 0.0–149.0)
VLDL: 28 mg/dL (ref 0.0–40.0)

## 2019-03-02 LAB — HEMOGLOBIN A1C: Hgb A1c MFr Bld: 6.6 % — ABNORMAL HIGH (ref 4.6–6.5)

## 2019-03-02 LAB — BASIC METABOLIC PANEL
BUN: 23 mg/dL (ref 6–23)
CO2: 28 mEq/L (ref 19–32)
Calcium: 10 mg/dL (ref 8.4–10.5)
Chloride: 99 mEq/L (ref 96–112)
Creatinine, Ser: 1.04 mg/dL (ref 0.40–1.50)
GFR: 73.07 mL/min (ref >60.00)
Glucose, Bld: 115 mg/dL — ABNORMAL HIGH (ref 70–99)
Potassium: 4.2 mEq/L (ref 3.5–5.1)
Sodium: 137 mEq/L (ref 135–145)

## 2019-03-02 LAB — PSA: PSA: 1.7 ng/mL (ref 0.10–4.00)

## 2019-03-02 LAB — TSH: TSH: 0.75 u[IU]/mL (ref 0.35–4.50)

## 2019-03-02 NOTE — Progress Notes (Signed)
   Subjective:    Patient ID: Kevin Barnes, male    DOB: February 03, 1961, 58 y.o.   MRN: [id number]  HPI CPE- UTD on colonoscopy.  Due for eye exam, foot exam.  UTD on immunizations.  No concerns today.   Review of Systems Patient reports no vision/hearing changes, anorexia, fever ,adenopathy, persistant/recurrent hoarseness, swallowing issues, chest pain, palpitations, edema, persistant/recurrent cough, hemoptysis, dyspnea (rest,exertional, paroxysmal nocturnal), gastrointestinal  bleeding (melena, rectal bleeding), abdominal pain, excessive heart burn, GU symptoms (dysuria, hematuria, voiding/incontinence issues) syncope, focal weakness, memory loss, numbness & tingling, skin/hair/nail changes, depression, anxiety, abnormal bruising/bleeding, musculoskeletal symptoms/signs.     Objective:   Physical Exam General Appearance:    Alert, cooperative, no distress, appears stated age  Head:    Normocephalic, without obvious abnormality, atraumatic  Eyes:    PERRL, conjunctiva/corneas clear, EOM's intact, fundi    benign, both eyes       Ears:    Normal TM's and external ear canals, both ears  Nose:   Nares normal, septum midline, mucosa normal, no drainage   or sinus tenderness  Throat:   Lips, mucosa, and tongue normal; teeth and gums normal  Neck:   Supple, symmetrical, trachea midline, no adenopathy;       thyroid:  No enlargement/tenderness/nodules  Back:     Symmetric, no curvature, ROM normal, no CVA tenderness  Lungs:     Clear to auscultation bilaterally, respirations unlabored  Chest wall:    No tenderness or deformity  Heart:    Regular rate and rhythm, S1 and S2 normal, no murmur, rub   or gallop  Abdomen:     Soft, non-tender, bowel sounds active all four quadrants,    no masses, no organomegaly  Genitalia:    Deferred to urology  Rectal:    Extremities:   Extremities normal, atraumatic, no cyanosis or edema  Pulses:   2+ and symmetric all extremities  Skin:   Skin color,  texture, turgor normal, no rashes or lesions  Lymph nodes:   Cervical, supraclavicular, and axillary nodes normal  Neurologic:   CNII-XII intact. Normal strength, sensation and reflexes      throughout          Assessment & Plan:

## 2019-03-02 NOTE — Assessment & Plan Note (Signed)
Chronic problem.  Follows w/ Dr Dwyane Dee.  Due for A1C.  Encouraged pt to get eye exam.  Foot exam done today.

## 2019-03-02 NOTE — Assessment & Plan Note (Signed)
Pt's PE WNL w/ exception of obesity.  UTD on colonoscopy, immunizations.  Check labs.  Anticipatory guidance provided.

## 2019-03-02 NOTE — Assessment & Plan Note (Signed)
Chronic problem.  Stressed need for healthy diet and regular exercise.  Check labs.  Will follow. 

## 2019-03-02 NOTE — Patient Instructions (Signed)
Follow up in 6 months to recheck BP and cholesterol We'll notify you of your lab results and make any changes if needed Continue to work on healthy diet and regular exercise- you can do it! Schedule your eye exam at your convenience Call with any questions or concerns Stay Safe!!!

## 2019-03-03 ENCOUNTER — Encounter: Payer: Self-pay | Admitting: General Practice

## 2019-03-16 ENCOUNTER — Other Ambulatory Visit: Payer: Self-pay | Admitting: Family Medicine

## 2019-03-16 DIAGNOSIS — I10 Essential (primary) hypertension: Secondary | ICD-10-CM

## 2019-03-16 DIAGNOSIS — E119 Type 2 diabetes mellitus without complications: Secondary | ICD-10-CM

## 2019-03-17 ENCOUNTER — Other Ambulatory Visit: Payer: Self-pay

## 2019-03-17 DIAGNOSIS — Z20822 Contact with and (suspected) exposure to covid-19: Secondary | ICD-10-CM

## 2019-03-19 LAB — NOVEL CORONAVIRUS, NAA: SARS-CoV-2, NAA: NOT DETECTED

## 2019-03-23 ENCOUNTER — Other Ambulatory Visit: Payer: Self-pay

## 2019-03-23 DIAGNOSIS — Z20822 Contact with and (suspected) exposure to covid-19: Secondary | ICD-10-CM

## 2019-03-24 LAB — NOVEL CORONAVIRUS, NAA: SARS-CoV-2, NAA: DETECTED — AB

## 2019-03-25 ENCOUNTER — Encounter: Payer: Self-pay | Admitting: Family Medicine

## 2019-04-03 ENCOUNTER — Encounter: Payer: Self-pay | Admitting: Family Medicine

## 2019-04-06 ENCOUNTER — Encounter: Payer: Self-pay | Admitting: Family Medicine

## 2019-04-06 ENCOUNTER — Other Ambulatory Visit: Payer: Self-pay

## 2019-04-06 ENCOUNTER — Other Ambulatory Visit: Payer: 59

## 2019-04-13 ENCOUNTER — Ambulatory Visit: Payer: 59 | Admitting: Endocrinology

## 2019-04-21 ENCOUNTER — Other Ambulatory Visit (INDEPENDENT_AMBULATORY_CARE_PROVIDER_SITE_OTHER): Payer: 59

## 2019-04-21 ENCOUNTER — Other Ambulatory Visit: Payer: Self-pay

## 2019-04-21 DIAGNOSIS — E119 Type 2 diabetes mellitus without complications: Secondary | ICD-10-CM | POA: Diagnosis not present

## 2019-04-21 LAB — COMPREHENSIVE METABOLIC PANEL
ALT: 24 U/L (ref 0–53)
AST: 19 U/L (ref 0–37)
Albumin: 4.4 g/dL (ref 3.5–5.2)
Alkaline Phosphatase: 42 U/L (ref 39–117)
BUN: 17 mg/dL (ref 6–23)
CO2: 29 mEq/L (ref 19–32)
Calcium: 10.1 mg/dL (ref 8.4–10.5)
Chloride: 98 mEq/L (ref 96–112)
Creatinine, Ser: 0.99 mg/dL (ref 0.40–1.50)
GFR: 77.31 mL/min (ref >60.00)
Glucose, Bld: 121 mg/dL — ABNORMAL HIGH (ref 70–99)
Potassium: 4.3 mEq/L (ref 3.5–5.1)
Sodium: 135 mEq/L (ref 135–145)
Total Bilirubin: 0.5 mg/dL (ref 0.2–1.2)
Total Protein: 7.3 g/dL (ref 6.0–8.3)

## 2019-04-21 LAB — MICROALBUMIN / CREATININE URINE RATIO
Creatinine,U: 65.5 mg/dL
Microalb Creat Ratio: 3.4 mg/g (ref 0.0–30.0)
Microalb, Ur: 2.2 mg/dL — ABNORMAL HIGH (ref 0.0–1.9)

## 2019-04-21 LAB — HEMOGLOBIN A1C: Hgb A1c MFr Bld: 6.6 % — ABNORMAL HIGH (ref 4.6–6.5)

## 2019-04-28 ENCOUNTER — Ambulatory Visit: Payer: 59 | Admitting: Endocrinology

## 2019-04-28 ENCOUNTER — Other Ambulatory Visit: Payer: Self-pay

## 2019-04-28 ENCOUNTER — Encounter: Payer: Self-pay | Admitting: Endocrinology

## 2019-04-28 VITALS — BP 130/80 | HR 71 | Ht 70.0 in | Wt 223.6 lb

## 2019-04-28 DIAGNOSIS — E119 Type 2 diabetes mellitus without complications: Secondary | ICD-10-CM | POA: Diagnosis not present

## 2019-04-28 NOTE — Patient Instructions (Signed)
Check blood sugars on waking up 2-3 days a week  Also check blood sugars about 2 hours after meals and do this after different meals by rotation  Recommended blood sugar levels on waking up are 90-130 and about 2 hours after meal is 130-160  Please bring your blood sugar monitor to each visit, thank you   

## 2019-04-28 NOTE — Progress Notes (Signed)
Patient ID: Kevin Barnes, male   DOB: 18-Jan-1961, 58 y.o.   MRN: [id number]            Reason for Appointment: Follow-up for Type 2 Diabetes  Referring physician: Birdie Riddle   History of Present Illness:          Date of diagnosis of type 2 diabetes mellitus:  2013      Background history:  He was diagnosed to have diabetes with a glucose of 132 in 2013 and baseline A1c of 6.4 He was started on metformin in 01/2013 when blood sugars were higher His blood sugars were reasonably controlled in the first 2 years or so, he was also given glipizide initially in 2015 but this caused hypoglycemia and he did not continue this. He had been followed up relatively sporadically He was also tried on Januvia subsequently in 2017 when his blood sugars were higher but this was changed to [place]  He did not take this because of the cost   A1c baseline in February 2018 was 9.2,  Recent history:   His A1c is 6.6 again for the last 3 times   Non-insulin hypoglycemic drugs the patient is taking are:  Jardiance 25 mg daily pm , Metformin 1g bid, Amaryl 1 mg at suppertime  Current management, blood sugar patterns and problems identified:  He has had fairly consistently good blood sugars although not checking after dinner  He had apparently lost weight when he had the coronavirus infection in August  However he is starting to walk a little more recently  Periodically will go off his diet with eating out and he thinks he gained back some weight this weekend  Otherwise fairly consistent with his metformin and Jardiance as directed  Fasting blood sugars are relatively better than on his last visit in March  He thinks that occasionally if he checks his sugars after dinner they could be around 180  No hypoglycemia with Amaryl Lab glucose was 121 fasting       Side effects from medications have : None, tries to take metformin with food  Hypoglycemia:  he feels low sugars if the blood sugar is <90   Glucose monitoring:  done 1 times a day         Glucometer: One Touch Verio .       Blood Glucose readings by download  Recent range 74-154 with AVERAGE 109, highest reading after lunch  Previous readings:  PRE-MEAL Fasting Lunch Dinner Bedtime Overall  Glucose range:  113-146      Mean/median:  130  113    113   POST-MEAL PC Breakfast PC Lunch PC Dinner  Glucose range:  94-134    Mean/median:   107      Self-care: The diet that the patient has been following is: tries to limit Drinks with sugar .     Meal times are:  Breakfast is at 7 am Lunch: Variable Dinner: 7 pm    Typical meal intake: Breakfast is small.  He will have  some times a day biscuit late morning His snacks will be peanut butter crackers or chips.                Dietician visit, most recent: 11/19/16               Exercise:  walking now  Weight history:  Wt Readings from Last 3 Encounters:  04/28/19 223 lb 9.6 oz (101.4 kg)  03/02/19 225 lb 8 oz (102.3  kg)  10/13/18 227 lb 6.4 oz (103.1 kg)    Glycemic control:   Lab Results  Component Value Date   HGBA1C 6.6 (H) 04/21/2019   HGBA1C 6.6 (H) 03/02/2019   HGBA1C 6.6 (H) 10/06/2018   Lab Results  Component Value Date   MICROALBUR 2.2 (H) 04/21/2019   LDLCALC 63 03/02/2019   CREATININE 0.99 04/21/2019   Lab Results  Component Value Date   MICRALBCREAT 3.4 04/21/2019    Lab Results  Component Value Date   FRUCTOSAMINE 295 (H) 11/19/2016      Allergies as of 04/28/2019   No Known Allergies     Medication List       Accurate as of April 28, 2019  9:12 AM. If you have any questions, ask your nurse or doctor.        aspirin 81 MG tablet Take 81 mg by mouth daily.   atorvastatin 20 MG tablet Commonly known as: LIPITOR TAKE 1 TABLET BY MOUTH ONCE DAILY IN THE EVENING . APPOINTMENT REQUIRED FOR FUTURE REFILLS   B-12 5000 MCG Subl Place under the tongue.   butalbital-acetaminophen-caffeine 50-325-40 MG tablet Commonly known as:  FIORICET Take 1 tablet by mouth daily as needed for headache.   cholecalciferol 1000 units tablet Commonly known as: VITAMIN D Take 2,000 Units by mouth daily.   fenofibrate 160 MG tablet Take 1 tablet by mouth once daily   glimepiride 1 MG tablet Commonly known as: AMARYL TAKE 1 TABLET BY MOUTH ONCE DAILY BEFORE SUPPER   Jardiance 25 MG Tabs tablet Generic drug: empagliflozin Take 1 tablet by mouth once daily   lisinopril-hydrochlorothiazide 20-25 MG tablet Commonly known as: ZESTORETIC Take 1 tablet by mouth once daily   metFORMIN 1000 MG tablet Commonly known as: GLUCOPHAGE TAKE 1 TABLET BY MOUTH TWICE DAILY WITH A MEAL   Omega-3 Krill Oil 1000 MG Caps Take by mouth.   OneTouch Verio test strip Generic drug: glucose blood USE 1 STRIP 1 TO CHECK SUGAR 1-2 TIMES DAILY   OVER THE COUNTER MEDICATION Sunny Mood and Curamin   sildenafil 50 MG tablet Commonly known as: Viagra TAKE TWO TABLETS BY MOUTH AS NEEDED FOR ERECTILE DYSFUNCTION       Allergies: No Known Allergies  Past Medical History:  Diagnosis Date  . Allergy   . Basal cell carcinoma of cheek 2016  . Diabetes mellitus   . Hyperlipidemia   . Hypertension   . Obesity   . Obesity     Past Surgical History:  Procedure Laterality Date  . PROSTATE SURGERY      Family History  Problem Relation Age of Onset  . Cancer Father        Prostate  . Heart disease Father   . Hyperlipidemia Father   . Hypertension Father   . Heart failure Mother   . Stroke Paternal Grandfather   . Diabetes Neg Hx     Social History:  reports that he has quit smoking. He has never used smokeless tobacco. He reports current alcohol use of about 1.0 standard drinks of alcohol per week. He reports that he does not use drugs.   Review of Systems   Lipid history: He has had persistently high triglycerides with control in the past He is taking fenofibrate in addition to his Lipitor This is followed by PCP and  cardiologist   Lab Results  Component Value Date   CHOL 136 03/02/2019   HDL 45.00 03/02/2019   [place] 63 03/02/2019  LDLDIRECT 111.0 06/24/2017   TRIG 140.0 03/02/2019   CHOLHDL 3 03/02/2019           Hypertension: Has had this for several years, taking lisinopril HCTZ from his PCP   Usually not checking blood pressure at home    BP Readings from Last 3 Encounters:  04/28/19 130/80  03/02/19 118/81  10/13/18 122/78   Hypercalcemia: His calcium was high on his initial visit only PTH is not elevated  He does take HCTZ  Most recent eye exam was 1/18  Most recent foot exam:4/18    LABS:  No visits with results within 1 Week(s) from this visit.  Latest known visit with results is:  Lab on 04/21/2019  Component Date Value Ref Range Status  . Microalb, Ur 04/21/2019 2.2* 0.0 - 1.9 mg/dL Final  . Creatinine,U 04/21/2019 65.5  mg/dL Final  . Microalb Creat Ratio 04/21/2019 3.4  0.0 - 30.0 mg/g Final  . Sodium 04/21/2019 135  135 - 145 mEq/L Final  . Potassium 04/21/2019 4.3  3.5 - 5.1 mEq/L Final  . Chloride 04/21/2019 98  96 - 112 mEq/L Final  . CO2 04/21/2019 29  19 - 32 mEq/L Final  . Glucose, Bld 04/21/2019 121* 70 - 99 mg/dL Final  . BUN 04/21/2019 17  6 - 23 mg/dL Final  . Creatinine, Ser 04/21/2019 0.99  0.40 - 1.50 mg/dL Final  . Total Bilirubin 04/21/2019 0.5  0.2 - 1.2 mg/dL Final  . Alkaline Phosphatase 04/21/2019 42  39 - 117 U/L Final  . AST 04/21/2019 19  0 - 37 U/L Final  . ALT 04/21/2019 24  0 - 53 U/L Final  . Total Protein 04/21/2019 7.3  6.0 - 8.3 g/dL Final  . Albumin 04/21/2019 4.4  3.5 - 5.2 g/dL Final  . Calcium 04/21/2019 10.1  8.4 - 10.5 mg/dL Final  . GFR 04/21/2019 77.31  >60.00 mL/min Final  . Hgb A1c MFr Bld 04/21/2019 6.6* 4.6 - 6.5 % Final   Glycemic Control Guidelines for People with Diabetes:Non Diabetic:  <6%Goal of Therapy: <7%Additional Action Suggested:  >8%     Physical Examination:  BP 130/80 (BP Location: Left Arm,  Patient Position: Sitting, Cuff Size: Normal)   Pulse 71   Ht 5\' 10"  (1.778 m)   Wt 223 lb 9.6 oz (101.4 kg)   SpO2 96%   BMI 32.08 kg/m       ASSESSMENT:  Diabetes type 2 non-insulin-dependent  See history of present illness for detailed discussion of current diabetes management, blood sugar patterns and problems identified  His A1c is 6.6 and unchanged  He is on a multidrug regimen including Jardiance 25 mg  He has excellent blood sugars at home although checking mostly before meals and only occasionally after lunch Overall has been less active for various reasons although has dropped some weight this year As before he forgets to check readings after dinner  Renal function stable with continuing Jardiance No microalbuminuria  Hypertension: Well controlled   PLAN:   No change in medication To check sugars after dinner more regularly Encouraged him to increase his walking progressively He will try to schedule his follow-up eye exam which is overdue Follow-up in 4 months and to call if he has any new problems  Patient Instructions  Check blood sugars on waking up 2-3 days a week  Also check blood sugars about 2 hours after meals and do this after different meals by rotation  Recommended blood sugar levels on waking  up are 90-130 and about 2 hours after meal is 130-160  Please bring your blood sugar monitor to each visit, thank you      Elayne Snare 04/28/2019, 9:12 AM   Note: This office note was prepared with Dragon voice recognition system technology. Any transcriptional errors that result from this process are unintentional.

## 2019-05-07 ENCOUNTER — Other Ambulatory Visit: Payer: Self-pay | Admitting: Endocrinology

## 2019-05-24 ENCOUNTER — Other Ambulatory Visit: Payer: Self-pay | Admitting: Endocrinology

## 2019-06-22 ENCOUNTER — Other Ambulatory Visit: Payer: Self-pay | Admitting: Family Medicine

## 2019-06-22 DIAGNOSIS — I10 Essential (primary) hypertension: Secondary | ICD-10-CM

## 2019-06-22 DIAGNOSIS — E119 Type 2 diabetes mellitus without complications: Secondary | ICD-10-CM

## 2019-06-24 ENCOUNTER — Other Ambulatory Visit: Payer: Self-pay | Admitting: Family Medicine

## 2019-08-11 ENCOUNTER — Other Ambulatory Visit: Payer: Self-pay | Admitting: Endocrinology

## 2019-08-24 ENCOUNTER — Other Ambulatory Visit: Payer: Self-pay

## 2019-08-24 ENCOUNTER — Other Ambulatory Visit (INDEPENDENT_AMBULATORY_CARE_PROVIDER_SITE_OTHER): Payer: 59

## 2019-08-24 DIAGNOSIS — E119 Type 2 diabetes mellitus without complications: Secondary | ICD-10-CM

## 2019-08-24 LAB — COMPREHENSIVE METABOLIC PANEL
ALT: 39 U/L (ref 0–53)
AST: 26 U/L (ref 0–37)
Albumin: 4.4 g/dL (ref 3.5–5.2)
Alkaline Phosphatase: 42 U/L (ref 39–117)
BUN: 20 mg/dL (ref 6–23)
CO2: 30 mEq/L (ref 19–32)
Calcium: 10.2 mg/dL (ref 8.4–10.5)
Chloride: 99 mEq/L (ref 96–112)
Creatinine, Ser: 1.02 mg/dL (ref 0.40–1.50)
GFR: 74.6 mL/min (ref >60.00)
Glucose, Bld: 131 mg/dL — ABNORMAL HIGH (ref 70–99)
Potassium: 4.9 mEq/L (ref 3.5–5.1)
Sodium: 137 mEq/L (ref 135–145)
Total Bilirubin: 0.5 mg/dL (ref 0.2–1.2)
Total Protein: 7.5 g/dL (ref 6.0–8.3)

## 2019-08-24 LAB — HEMOGLOBIN A1C: Hgb A1c MFr Bld: 6.9 % — ABNORMAL HIGH (ref 4.6–6.5)

## 2019-08-24 LAB — HM DIABETES EYE EXAM

## 2019-08-30 NOTE — Progress Notes (Signed)
Patient ID: Kevin Barnes, male   DOB: [birth year], 59 y.o.   MRN: [id number]            Reason for Appointment: Follow-up for Type 2 Diabetes  Referring physician: Birdie Riddle   History of Present Illness:          Date of diagnosis of type 2 diabetes mellitus:  2013      Background history:  He was diagnosed to have diabetes with a glucose of 132 in 2013 and baseline A1c of 6.4 He was started on metformin in 01/2013 when blood sugars were higher His blood sugars were reasonably controlled in the first 2 years or so, he was also given glipizide initially in 2015 but this caused hypoglycemia and he did not continue this. He had been followed up relatively sporadically He was also tried on Januvia subsequently in 2017 when his blood sugars were higher but this was changed to [place]  He did not take this because of the cost   A1c baseline in February 2018 was 9.2,  Recent history:   His A1c is slightly higher at 6.9 compared to 6.6 which was a stable level   Non-insulin hypoglycemic drugs the patient is taking are:  Jardiance 25 mg daily in evening, Metformin 1g bid, Amaryl 1 mg at suppertime  Current management, blood sugar patterns and problems identified:  He has been checking blood sugars mostly in the mornings on before lunch  Despite reminders he does not check readings after meals  He says that over the holidays he was not good on his diet and also was traveling and vacationing  He does take his Jardiance in the evening and has done that for some time  Blood sugars are low normal midday occasionally and he says he feels a little shaky if blood sugar is below 90  Weight has gone back up a little since last visit  Has not done any walking except on his vacation Lab glucose was 131 fasting and home glucose 07/23/2016 the same morning       Side effects from medications have : None, tries to take metformin with food  Hypoglycemia:  he feels low sugars if the blood sugar is  <90  Glucose monitoring:  done 1 times a day         Glucometer: One Touch Verio .       Blood Glucose readings by download   PRE-MEAL  mornings Lunch Dinner Bedtime Overall  Glucose range:  104-127  83-110     Mean/median: 125     106   Previous blood sugar  Recent range 74-154 with AVERAGE 109, highest reading after lunch    Self-care: The diet that the patient has been following is: tries to limit Drinks with sugar .     Meal times are:  Breakfast is at 7 am Lunch: Variable Dinner: 7 pm    Typical meal intake: Breakfast is small and may not always have a protein.  He will have  some times a day biscuit late morning His snacks will be peanut butter crackers or chips.                Dietician visit, most recent: 11/19/16               Exercise:  walking less recently  Weight history:  Wt Readings from Last 3 Encounters:  08/31/19 227 lb (103 kg)  04/28/19 223 lb 9.6 oz (101.4 kg)  03/02/19 225 lb  8 oz (102.3 kg)    Glycemic control:   Lab Results  Component Value Date   HGBA1C 6.9 (H) 08/24/2019   HGBA1C 6.6 (H) 04/21/2019   HGBA1C 6.6 (H) 03/02/2019   Lab Results  Component Value Date   MICROALBUR 2.2 (H) 04/21/2019   LDLCALC 63 03/02/2019   CREATININE 1.02 08/24/2019   Lab Results  Component Value Date   MICRALBCREAT 3.4 04/21/2019    Lab Results  Component Value Date   FRUCTOSAMINE 295 (H) 11/19/2016      Allergies as of 08/31/2019   No Known Allergies     Medication List       Accurate as of August 31, 2019  8:13 AM. If you have any questions, ask your nurse or doctor.        aspirin 81 MG tablet Take 81 mg by mouth daily.   atorvastatin 20 MG tablet Commonly known as: LIPITOR TAKE 1 TABLET BY MOUTH ONCE DAILY IN THE EVENING . APPOINTMENT REQUIRED FOR FUTURE REFILLS   B-12 5000 MCG Subl Place under the tongue.   butalbital-acetaminophen-caffeine 50-325-40 MG tablet Commonly known as: FIORICET Take 1 tablet by mouth daily as needed  for headache.   cholecalciferol 1000 units tablet Commonly known as: VITAMIN D Take 2,000 Units by mouth daily.   fenofibrate 160 MG tablet Take 1 tablet by mouth once daily   glimepiride 1 MG tablet Commonly known as: AMARYL TAKE 1 TABLET BY MOUTH ONCE DAILY BEFORE SUPPER   Jardiance 25 MG Tabs tablet Generic drug: empagliflozin Take 1 tablet by mouth once daily   lisinopril-hydrochlorothiazide 20-25 MG tablet Commonly known as: ZESTORETIC Take 1 tablet by mouth once daily   metFORMIN 1000 MG tablet Commonly known as: GLUCOPHAGE TAKE 1 TABLET BY MOUTH TWICE DAILY WITH A MEAL   Omega-3 Krill Oil 1000 MG Caps Take by mouth.   OneTouch Verio test strip Generic drug: glucose blood USE 1 STRIP 1 TO CHECK SUGAR 1-2 TIMES DAILY   OVER THE COUNTER MEDICATION Sunny Mood and Curamin   sildenafil 50 MG tablet Commonly known as: Viagra TAKE TWO TABLETS BY MOUTH AS NEEDED FOR ERECTILE DYSFUNCTION       Allergies: No Known Allergies  Past Medical History:  Diagnosis Date  . Allergy   . Basal cell carcinoma of cheek 2016  . Diabetes mellitus   . Hyperlipidemia   . Hypertension   . Obesity   . Obesity     Past Surgical History:  Procedure Laterality Date  . PROSTATE SURGERY      Family History  Problem Relation Age of Onset  . Cancer Father        Prostate  . Heart disease Father   . Hyperlipidemia Father   . Hypertension Father   . Heart failure Mother   . Stroke Paternal Grandfather   . Diabetes Neg Hx     Social History:  reports that he has quit smoking. He has never used smokeless tobacco. He reports current alcohol use of about 1.0 standard drinks of alcohol per week. He reports that he does not use drugs.   Review of Systems  Lipid history: He has had persistently high triglycerides previously He is taking fenofibrate in addition to his Lipitor This is followed by PCP and cardiologist   Lab Results  Component Value Date   CHOL 136 03/02/2019    HDL 45.00 03/02/2019   LDLCALC 63 03/02/2019   LDLDIRECT 111.0 06/24/2017   TRIG 140.0 03/02/2019  CHOLHDL 3 03/02/2019           Hypertension: Has had this for several years, taking lisinopril HCTZ   Usually not checking blood pressure at home   BP Readings from Last 3 Encounters:  08/31/19 130/76  04/28/19 130/80  03/02/19 118/81   Hypercalcemia: His calcium was high on his initial visit only and then high normal PTH not elevated  He does take HCTZ  Lab Results  Component Value Date   CALCIUM 10.2 08/24/2019     Most recent eye exam was 1/18  Most recent foot exam:4/18    LABS:  No visits with results within 1 Week(s) from this visit.  Latest known visit with results is:  Lab on 08/24/2019  Component Date Value Ref Range Status  . Sodium 08/24/2019 137  135 - 145 mEq/L Final  . Potassium 08/24/2019 4.9  3.5 - 5.1 mEq/L Final  . Chloride 08/24/2019 99  96 - 112 mEq/L Final  . CO2 08/24/2019 30  19 - 32 mEq/L Final  . Glucose, Bld 08/24/2019 131* 70 - 99 mg/dL Final  . BUN 08/24/2019 20  6 - 23 mg/dL Final  . Creatinine, Ser 08/24/2019 1.02  0.40 - 1.50 mg/dL Final  . Total Bilirubin 08/24/2019 0.5  0.2 - 1.2 mg/dL Final  . Alkaline Phosphatase 08/24/2019 42  39 - 117 U/L Final  . AST 08/24/2019 26  0 - 37 U/L Final  . ALT 08/24/2019 39  0 - 53 U/L Final  . Total Protein 08/24/2019 7.5  6.0 - 8.3 g/dL Final  . Albumin 08/24/2019 4.4  3.5 - 5.2 g/dL Final  . GFR 08/24/2019 74.60  >60.00 mL/min Final  . Calcium 08/24/2019 10.2  8.4 - 10.5 mg/dL Final  . Hgb A1c MFr Bld 08/24/2019 6.9* 4.6 - 6.5 % Final   Glycemic Control Guidelines for People with Diabetes:Non Diabetic:  <6%Goal of Therapy: <7%Additional Action Suggested:  >8%     Physical Examination:  BP 130/76 (BP Location: Left Arm, Patient Position: Sitting, Cuff Size: Normal)   Pulse 71   Ht 5\' 10"  (1.778 m)   Wt 227 lb (103 kg)   SpO2 97%   BMI 32.57 kg/m       ASSESSMENT:  Diabetes type  2 non-insulin-dependent  See history of present illness for detailed discussion of current diabetes management, blood sugar patterns and problems identified  His A1c is 6.9  He is on a multidrug regimen including Jardiance 25 mg and Amaryl  His fasting blood sugars are fairly good, highest 129 although fingerstick glucose is relatively lower than the lab glucose He again forgets to check readings after meals Discussed that checking blood sugars after meals helps with his compliance with diet Also not walking or other exercise recently  He has excellent blood sugars at home although checking mostly before meals and only occasionally after lunch Overall has been less active for various reasons although has dropped some weight this year As before he forgets to check readings after dinner He has been taking her medications very consistently however  No change in renal function with combining Jardiance and lisinopril  No history of microalbuminuria  HYPERTENSION: Well controlled  History of upper normal calcium levels: This is stable and may be partly related to HCTZ  Covid vaccination: He is still hesitant about doing this, has had an episode of mild infection last September, discussed how the vaccination is beneficial and that it is safe and will still be effective for  long-term protection even with the previous infection    PLAN:   Offered him the extended release Metformin that he can take at one time and combined with Jardiance but he does not want to do this He does need to start a walking program when he thinks he can get a treadmill for home Make sure he has some protein at breakfast Co-pay card for Jardiance given He will make sure he checks blood sugars after lunch or dinner alternating with fasting readings Consider reducing glimepiride if he starts getting low normal readings more often  Follow-up in 3 months  There are no Patient Instructions on file for this visit.    Elayne Snare 08/31/2019, 8:13 AM   Note: This office note was prepared with Dragon voice recognition system technology. Any transcriptional errors that result from this process are unintentional.  This visit occurred during the SARS-CoV-2 public health emergency.  Safety protocols were in place, including screening questions prior to the visit, additional usage of staff PPE, and extensive cleaning of exam room while observing appropriate contact time as indicated for disinfecting solutions.

## 2019-08-31 ENCOUNTER — Other Ambulatory Visit: Payer: Self-pay

## 2019-08-31 ENCOUNTER — Encounter: Payer: Self-pay | Admitting: Endocrinology

## 2019-08-31 ENCOUNTER — Ambulatory Visit: Payer: 59 | Admitting: Endocrinology

## 2019-08-31 ENCOUNTER — Encounter: Payer: Self-pay | Admitting: Family Medicine

## 2019-08-31 ENCOUNTER — Ambulatory Visit: Payer: 59 | Admitting: Family Medicine

## 2019-08-31 ENCOUNTER — Other Ambulatory Visit: Payer: Self-pay | Admitting: Endocrinology

## 2019-08-31 VITALS — BP 124/82 | HR 76 | Temp 97.9°F | Resp 16 | Ht 70.0 in | Wt 226.1 lb

## 2019-08-31 VITALS — BP 130/76 | HR 71 | Ht 70.0 in | Wt 227.0 lb

## 2019-08-31 DIAGNOSIS — E1165 Type 2 diabetes mellitus with hyperglycemia: Secondary | ICD-10-CM | POA: Diagnosis not present

## 2019-08-31 DIAGNOSIS — I10 Essential (primary) hypertension: Secondary | ICD-10-CM

## 2019-08-31 DIAGNOSIS — R6882 Decreased libido: Secondary | ICD-10-CM

## 2019-08-31 DIAGNOSIS — Z8616 Personal history of COVID-19: Secondary | ICD-10-CM

## 2019-08-31 DIAGNOSIS — E1169 Type 2 diabetes mellitus with other specified complication: Secondary | ICD-10-CM | POA: Diagnosis not present

## 2019-08-31 DIAGNOSIS — E785 Hyperlipidemia, unspecified: Secondary | ICD-10-CM | POA: Diagnosis not present

## 2019-08-31 DIAGNOSIS — E669 Obesity, unspecified: Secondary | ICD-10-CM | POA: Diagnosis not present

## 2019-08-31 LAB — LIPID PANEL
Cholesterol: 149 mg/dL (ref 0–200)
HDL: 48.5 mg/dL (ref >39.00)
LDL Cholesterol: 63 mg/dL (ref 0–99)
NonHDL: 100.28
Total CHOL/HDL Ratio: 3
Triglycerides: 184 mg/dL — ABNORMAL HIGH (ref 0.0–149.0)
VLDL: 36.8 mg/dL (ref 0.0–40.0)

## 2019-08-31 LAB — CBC WITH DIFFERENTIAL/PLATELET
Basophils Absolute: 0.1 10*3/uL (ref 0.0–0.1)
Basophils Relative: 1.3 % (ref 0.0–3.0)
Eosinophils Absolute: 0.4 10*3/uL (ref 0.0–0.7)
Eosinophils Relative: 4.2 % (ref 0.0–5.0)
HCT: 43.7 % (ref 39.0–52.0)
Hemoglobin: 15.1 g/dL (ref 13.0–17.0)
Lymphocytes Relative: 24.5 % (ref 12.0–46.0)
Lymphs Abs: 2.2 10*3/uL (ref 0.7–4.0)
MCHC: 34.4 g/dL (ref 30.0–36.0)
MCV: 91.6 fl (ref 78.0–100.0)
Monocytes Absolute: 0.7 10*3/uL (ref 0.1–1.0)
Monocytes Relative: 8.1 % (ref 3.0–12.0)
Neutro Abs: 5.6 10*3/uL (ref 1.4–7.7)
Neutrophils Relative %: 61.9 % (ref 43.0–77.0)
Platelets: 324 10*3/uL (ref 150.0–400.0)
RBC: 4.78 Mil/uL (ref 4.22–5.81)
RDW: 12.5 % (ref 11.5–15.5)
WBC: 9 10*3/uL (ref 4.0–10.5)

## 2019-08-31 LAB — TSH: TSH: 0.55 u[IU]/mL (ref 0.35–4.50)

## 2019-08-31 NOTE — Progress Notes (Signed)
   Subjective:    Patient ID: Kevin Barnes, male    DOB: 1960/12/23, 59 y.o.   MRN: [id number]  HPI HTN- chronic problem, on Lisinopril HCTZ 20/25mg  daily w/ good control.  Denies CP, SOB, HAs, visual changes, edema.  Hyperlipidemia- chronic problem, on Lipitor 20mg  daily and Fenofibrate 160mg  daily.  Denies abd pain, N/V.  Obesity- ongoing issue for pt.  BMI stable at 32.45  No regular exercise.  Hx of COVID- recovered well, no lingering effects.  No SOB.    Decreased libido- wife is concerned about testosterone level.  Pt is taking 4000 units Vit D.     Review of Systems For ROS see HPI   This visit occurred during the SARS-CoV-2 public health emergency.  Safety protocols were in place, including screening questions prior to the visit, additional usage of staff PPE, and extensive cleaning of exam room while observing appropriate contact time as indicated for disinfecting solutions.       Objective:   Physical Exam Vitals reviewed.  Constitutional:      General: He is not in acute distress.    Appearance: He is well-developed. He is obese.  HENT:     Head: Normocephalic and atraumatic.  Eyes:     Conjunctiva/sclera: Conjunctivae normal.     Pupils: Pupils are equal, round, and reactive to light.  Neck:     Thyroid: No thyromegaly.  Cardiovascular:     Rate and Rhythm: Normal rate and regular rhythm.     Heart sounds: Normal heart sounds. No murmur.  Pulmonary:     Effort: Pulmonary effort is normal. No respiratory distress.     Breath sounds: Normal breath sounds.  Abdominal:     General: Bowel sounds are normal. There is no distension.     Palpations: Abdomen is soft.  Musculoskeletal:     Cervical back: Normal range of motion and neck supple.  Lymphadenopathy:     Cervical: No cervical adenopathy.  Skin:    General: Skin is warm and dry.  Neurological:     Mental Status: He is alert and oriented to person, place, and time.     Cranial Nerves: No cranial nerve  deficit.  Psychiatric:        Behavior: Behavior normal.           Assessment & Plan:  Decreased libido- pt's wife is concerned for possible low testosterone.  Discussed natural ways to boost testosterone.  Check labs and address if needed.

## 2019-08-31 NOTE — Patient Instructions (Signed)
Check blood sugars on waking up 3-4 days a week  Also check blood sugars about 2 hours after meals and do this after different meals by rotation  Recommended blood sugar levels on waking up are 90-130 and about 2 hours after meal is 130-160  Please bring your blood sugar monitor to each visit, thank you   

## 2019-08-31 NOTE — Patient Instructions (Signed)
Schedule your complete physical in 6 months We'll notify you of your lab results and make any changes if needed Continue to work on healthy diet and regular exercise- you can do it! Continue your Vit D daily Add onions and garlic to naturally boost testosterone Increase Magnesium to increase levels of free testosterone Call with any questions or concerns Stay Safe!  Stay Healthy!

## 2019-08-31 NOTE — Assessment & Plan Note (Signed)
Chronic problem.  Tolerating statin w/o difficulty.  Stressed need for healthy diet and regular exercise.  Check labs.  Adjust meds prn  

## 2019-08-31 NOTE — Assessment & Plan Note (Signed)
Thankfully pt has recovered w/o any obvious lingering effects.

## 2019-08-31 NOTE — Assessment & Plan Note (Signed)
Chronic problem.  Well controlled.  Asymptomatic.  Check labs.  No anticipated med changes.  Will follow. 

## 2019-08-31 NOTE — Assessment & Plan Note (Signed)
Ongoing issue for pt.  Stressed need for healthy diet and regular exercise.  Check labs to risk stratify.  Will follow 

## 2019-09-01 LAB — TESTOSTERONE TOTAL,FREE,BIO, MALES
Albumin: 4.5 g/dL (ref 3.6–5.1)
Sex Hormone Binding: 23 nmol/L (ref 22–77)
Testosterone, Bioavailable: 126.6 ng/dL (ref >=110.0)
Testosterone, Free: 61.6 pg/mL (ref 46.0–224.0)
Testosterone: 360 ng/dL (ref 250–827)

## 2019-09-15 ENCOUNTER — Other Ambulatory Visit: Payer: Self-pay | Admitting: Endocrinology

## 2019-09-15 DIAGNOSIS — E119 Type 2 diabetes mellitus without complications: Secondary | ICD-10-CM

## 2019-09-17 ENCOUNTER — Other Ambulatory Visit: Payer: Self-pay | Admitting: Family Medicine

## 2019-09-17 DIAGNOSIS — I10 Essential (primary) hypertension: Secondary | ICD-10-CM

## 2019-09-17 DIAGNOSIS — E119 Type 2 diabetes mellitus without complications: Secondary | ICD-10-CM

## 2019-09-30 ENCOUNTER — Other Ambulatory Visit: Payer: Self-pay | Admitting: Family Medicine

## 2019-11-16 ENCOUNTER — Other Ambulatory Visit: Payer: Self-pay | Admitting: Endocrinology

## 2019-11-30 ENCOUNTER — Other Ambulatory Visit: Payer: Self-pay

## 2019-11-30 ENCOUNTER — Other Ambulatory Visit (INDEPENDENT_AMBULATORY_CARE_PROVIDER_SITE_OTHER): Payer: 59

## 2019-11-30 DIAGNOSIS — E1165 Type 2 diabetes mellitus with hyperglycemia: Secondary | ICD-10-CM

## 2019-11-30 LAB — COMPREHENSIVE METABOLIC PANEL WITH GFR
ALT: 33 U/L (ref 0–53)
AST: 24 U/L (ref 0–37)
Albumin: 4.3 g/dL (ref 3.5–5.2)
Alkaline Phosphatase: 42 U/L (ref 39–117)
BUN: 21 mg/dL (ref 6–23)
CO2: 29 meq/L (ref 19–32)
Calcium: 9.6 mg/dL (ref 8.4–10.5)
Chloride: 97 meq/L (ref 96–112)
Creatinine, Ser: 1.07 mg/dL (ref 0.40–1.50)
GFR: 70.53 mL/min
Glucose, Bld: 133 mg/dL — ABNORMAL HIGH (ref 70–99)
Potassium: 4.3 meq/L (ref 3.5–5.1)
Sodium: 134 meq/L — ABNORMAL LOW (ref 135–145)
Total Bilirubin: 0.4 mg/dL (ref 0.2–1.2)
Total Protein: 7.3 g/dL (ref 6.0–8.3)

## 2019-11-30 LAB — HEMOGLOBIN A1C: Hgb A1c MFr Bld: 6.8 % — ABNORMAL HIGH (ref 4.6–6.5)

## 2019-12-01 ENCOUNTER — Other Ambulatory Visit: Payer: Self-pay | Admitting: Endocrinology

## 2019-12-04 ENCOUNTER — Other Ambulatory Visit: Payer: Self-pay

## 2019-12-05 NOTE — Progress Notes (Signed)
Patient ID: Kevin Barnes, male   DOB: 20-Jul-1961, 59 y.o.   MRN: [id number]            Reason for Appointment: Follow-up for Type 2 Diabetes  Referring physician: Birdie Riddle   History of Present Illness:          Date of diagnosis of type 2 diabetes mellitus:  2013      Background history:  He was diagnosed to have diabetes with a glucose of 132 in 2013 and baseline A1c of 6.4 He was started on metformin in 01/2013 when blood sugars were higher His blood sugars were reasonably controlled in the first 2 years or so, he was also given glipizide initially in 2015 but this caused hypoglycemia and he did not continue this. He had been followed up relatively sporadically He was also tried on Januvia subsequently in 2017 when his blood sugars were higher but this was changed to [place]  He did not take this because of the cost   A1c baseline in February 2018 was 9.2  Recent history:   His A1c is again slightly higher at 6.8 compared to 6.6 previously   Non-insulin hypoglycemic drugs the patient is taking are:  Jardiance 25 mg daily in evening, Metformin 1g bid, Amaryl 1 mg at suppertime  Current management, blood sugar patterns and problems identified:  He has not brought his meter for download today  Again he forgets to check readings at night  However he thinks that if he has checked a few readings an hour after eating it would be about 180  Previously did not have any readings on his meter at night  Morning sugars are reportedly otherwise 115-125 been checking blood sugars mostly in the mornings on before lunch  More recently had been off his diet with eating poorly and having more alcoholic drinks  Otherwise he thinks that he is recently trying to do more walking  Only when he is late for lunch he will feel a little shaky  Still continues to take Jardiance at dinnertime        Side effects from medications have : None, tries to take metformin with food  Hypoglycemia:  he  feels low sugars if the blood sugar is <90  Glucose monitoring:  done 1 times a day         Glucometer: One Touch Verio .       Blood Glucose readings by recall as above  Previous readings:  PRE-MEAL  mornings Lunch Dinner Bedtime Overall  Glucose range:  104-127  83-110     Mean/median: 125     106     Self-care: The diet that the patient has been following is: tries to limit Drinks with sugar .     Meal times are:  Breakfast is at 7 am Lunch: Variable Dinner: 7 pm    Typical meal intake: Breakfast is small and may not always have a protein.  He will have  some times a day biscuit late morning His snacks will be peanut butter crackers or chips.                Dietician visit, most recent: 11/19/16               Exercise:  walking   Weight history:  Wt Readings from Last 3 Encounters:  12/07/19 230 lb 3.2 oz (104.4 kg)  08/31/19 226 lb 2 oz (102.6 kg)  08/31/19 227 lb (103 kg)    Glycemic  control:   Lab Results  Component Value Date   HGBA1C 6.8 (H) 11/30/2019   HGBA1C 6.9 (H) 08/24/2019   HGBA1C 6.6 (H) 04/21/2019   Lab Results  Component Value Date   MICROALBUR 2.2 (H) 04/21/2019   LDLCALC 63 08/31/2019   CREATININE 1.07 11/30/2019   Lab Results  Component Value Date   MICRALBCREAT 3.4 04/21/2019    Lab Results  Component Value Date   FRUCTOSAMINE 295 (H) 11/19/2016      Allergies as of 12/07/2019   No Known Allergies     Medication List       Accurate as of Dec 07, 2019  8:19 AM. If you have any questions, ask your nurse or doctor.        aspirin 81 MG tablet Take 81 mg by mouth daily.   atorvastatin 20 MG tablet Commonly known as: LIPITOR TAKE 1 TABLET BY MOUTH ONCE DAILY IN THE EVENING . APPOINTMENT REQUIRED FOR FUTURE REFILLS   B-12 5000 MCG Subl Place under the tongue.   butalbital-acetaminophen-caffeine 50-325-40 MG tablet Commonly known as: FIORICET Take 1 tablet by mouth daily as needed for headache.   cholecalciferol 1000  units tablet Commonly known as: VITAMIN D Take 2,000 Units by mouth daily.   fenofibrate 160 MG tablet Take 1 tablet by mouth once daily   glimepiride 1 MG tablet Commonly known as: AMARYL TAKE 1 TABLET BY MOUTH ONCE DAILY BEFORE SUPPER   Jardiance 25 MG Tabs tablet Generic drug: empagliflozin Take 1 tablet by mouth once daily   lisinopril-hydrochlorothiazide 20-25 MG tablet Commonly known as: ZESTORETIC Take 1 tablet by mouth once daily   metFORMIN 1000 MG tablet Commonly known as: GLUCOPHAGE TAKE 1 TABLET BY MOUTH TWICE DAILY WITH A MEAL   Omega-3 Krill Oil 1000 MG Caps Take by mouth.   OneTouch Verio test strip Generic drug: glucose blood USE 1 STRIP 1 TO CHECK SUGAR 1-2 TIMES DAILY   OVER THE COUNTER MEDICATION Sunny Mood and Curamin   sildenafil 50 MG tablet Commonly known as: Viagra TAKE TWO TABLETS BY MOUTH AS NEEDED FOR ERECTILE DYSFUNCTION       Allergies: No Known Allergies  Past Medical History:  Diagnosis Date  . Allergy   . Basal cell carcinoma of cheek 2016  . Diabetes mellitus   . Hyperlipidemia   . Hypertension   . Obesity   . Obesity     Past Surgical History:  Procedure Laterality Date  . PROSTATE SURGERY      Family History  Problem Relation Age of Onset  . Cancer Father        Prostate  . Heart disease Father   . Hyperlipidemia Father   . Hypertension Father   . Heart failure Mother   . Stroke Paternal Grandfather   . Diabetes Neg Hx     Social History:  reports that he has quit smoking. He has never used smokeless tobacco. He reports current alcohol use of about 1.0 standard drinks of alcohol per week. He reports that he does not use drugs.   Review of Systems  Lipid history: He has had persistently high triglycerides previously He is taking fenofibrate in addition to his Lipitor  This is followed by PCP and cardiologist   Lab Results  Component Value Date   CHOL 149 08/31/2019   HDL 48.50 08/31/2019   LDLCALC 63  08/31/2019   LDLDIRECT 111.0 06/24/2017   TRIG 184.0 (H) 08/31/2019   CHOLHDL 3 08/31/2019  Hypertension: Has had this for several years, taking lisinopril HCTZ   Usually not checking blood pressure at home   BP Readings from Last 3 Encounters:  12/07/19 130/70  08/31/19 124/82  08/31/19 130/76   Hypercalcemia: His calcium was high on his initial visit only and then high normal PTH not elevated  He does take HCTZ  Lab Results  Component Value Date   CALCIUM 9.6 11/30/2019     Most recent eye exam was 2/21  Most recent foot exam:2/2    LABS:  No visits with results within 1 Week(s) from this visit.  Latest known visit with results is:  Lab on 11/30/2019  Component Date Value Ref Range Status  . Sodium 11/30/2019 134* 135 - 145 mEq/L Final  . Potassium 11/30/2019 4.3  3.5 - 5.1 mEq/L Final  . Chloride 11/30/2019 97  96 - 112 mEq/L Final  . CO2 11/30/2019 29  19 - 32 mEq/L Final  . Glucose, Bld 11/30/2019 133* 70 - 99 mg/dL Final  . BUN 11/30/2019 21  6 - 23 mg/dL Final  . Creatinine, Ser 11/30/2019 1.07  0.40 - 1.50 mg/dL Final  . Total Bilirubin 11/30/2019 0.4  0.2 - 1.2 mg/dL Final  . Alkaline Phosphatase 11/30/2019 42  39 - 117 U/L Final  . AST 11/30/2019 24  0 - 37 U/L Final  . ALT 11/30/2019 33  0 - 53 U/L Final  . Total Protein 11/30/2019 7.3  6.0 - 8.3 g/dL Final  . Albumin 11/30/2019 4.3  3.5 - 5.2 g/dL Final  . GFR 11/30/2019 70.53  >60.00 mL/min Final  . Calcium 11/30/2019 9.6  8.4 - 10.5 mg/dL Final  . Hgb A1c MFr Bld 11/30/2019 6.8* 4.6 - 6.5 % Final   Glycemic Control Guidelines for People with Diabetes:Non Diabetic:  <6%Goal of Therapy: <7%Additional Action Suggested:  >8%     Physical Examination:  BP 130/70 (BP Location: Left Arm, Patient Position: Sitting, Cuff Size: Normal)   Pulse 87   Ht 5\' 10"  (1.778 m)   Wt 230 lb 3.2 oz (104.4 kg)   SpO2 99%   BMI 33.03 kg/m       ASSESSMENT:  Diabetes type 2  non-insulin-dependent  See history of present illness for detailed discussion of current diabetes management, blood sugar patterns and problems identified  His A1c is 6.8  He is on a multidrug regimen including Jardiance 25 mg and Amaryl  Again has been somewhat inconsistent with lifestyle measures Checking blood sugars mostly in the mornings and did not bring his meter for download Discussed that he still tends to have high readings after dinner when he is not monitoring much and this is likely related to increased portions of carbohydrates and larger meals in general Also can consistently cut back on any alcohol intake He has recently started walking and encouraged him to increase this  He seems a little less motivated but his wife is encouraging him to make changes Reminded him to check blood sugars after meals especially at night even if it is couple of times a week  Lab glucose 133 fasting on lisinopril HCT   HYPERTENSION: Well controlled  History of upper normal calcium levels: This is consistently normal and may be partly related to HCTZ Also mild hyponatremia is likely from HCTZ  Lipids: Followed by PCP    PLAN:   No change in medication regimen Lifestyle changes and monitoring as above To bring monitor on the next visit for download Consider reducing or  stopping glimepiride if he has symptoms of low blood sugar more consistently or having low sugars at lunchtime  Follow-up in 4 months  There are no Patient Instructions on file for this visit.   Elayne Snare 12/07/2019, 8:19 AM   Note: This office note was prepared with Dragon voice recognition system technology. Any transcriptional errors that result from this process are unintentional.  This visit occurred during the SARS-CoV-2 public health emergency.  Safety protocols were in place, including screening questions prior to the visit, additional usage of staff PPE, and extensive cleaning of exam room while observing  appropriate contact time as indicated for disinfecting solutions.

## 2019-12-07 ENCOUNTER — Ambulatory Visit: Payer: 59 | Admitting: Endocrinology

## 2019-12-07 ENCOUNTER — Encounter: Payer: Self-pay | Admitting: Endocrinology

## 2019-12-07 ENCOUNTER — Other Ambulatory Visit: Payer: Self-pay

## 2019-12-07 VITALS — BP 130/70 | HR 87 | Ht 70.0 in | Wt 230.2 lb

## 2019-12-07 DIAGNOSIS — E871 Hypo-osmolality and hyponatremia: Secondary | ICD-10-CM

## 2019-12-07 DIAGNOSIS — E1165 Type 2 diabetes mellitus with hyperglycemia: Secondary | ICD-10-CM

## 2019-12-07 DIAGNOSIS — I10 Essential (primary) hypertension: Secondary | ICD-10-CM | POA: Diagnosis not present

## 2019-12-07 NOTE — Patient Instructions (Addendum)
Check blood sugars on waking up 3 days a week  Also check blood sugars about 2 hours after meals and do this after different meals by rotation  Recommended blood sugar levels on waking up are 90-130 and about 2 hours after meal is 130-160  Please bring your blood sugar monitor to each visit, thank you  Walk daily 

## 2019-12-15 ENCOUNTER — Other Ambulatory Visit: Payer: Self-pay | Admitting: Endocrinology

## 2019-12-15 ENCOUNTER — Other Ambulatory Visit: Payer: Self-pay | Admitting: Family Medicine

## 2019-12-15 DIAGNOSIS — I10 Essential (primary) hypertension: Secondary | ICD-10-CM

## 2019-12-15 DIAGNOSIS — E119 Type 2 diabetes mellitus without complications: Secondary | ICD-10-CM

## 2020-01-01 ENCOUNTER — Other Ambulatory Visit: Payer: Self-pay | Admitting: Family Medicine

## 2020-02-10 ENCOUNTER — Other Ambulatory Visit: Payer: Self-pay | Admitting: Endocrinology

## 2020-02-10 ENCOUNTER — Other Ambulatory Visit: Payer: Self-pay | Admitting: Family Medicine

## 2020-02-10 DIAGNOSIS — N529 Male erectile dysfunction, unspecified: Secondary | ICD-10-CM

## 2020-03-01 ENCOUNTER — Other Ambulatory Visit: Payer: Self-pay | Admitting: Endocrinology

## 2020-03-08 ENCOUNTER — Encounter: Payer: 59 | Admitting: Family Medicine

## 2020-03-15 ENCOUNTER — Other Ambulatory Visit: Payer: Self-pay

## 2020-03-15 ENCOUNTER — Encounter: Payer: 59 | Admitting: Family Medicine

## 2020-03-15 ENCOUNTER — Ambulatory Visit (INDEPENDENT_AMBULATORY_CARE_PROVIDER_SITE_OTHER): Payer: 59 | Admitting: Family Medicine

## 2020-03-15 ENCOUNTER — Encounter: Payer: Self-pay | Admitting: Family Medicine

## 2020-03-15 VITALS — BP 126/74 | HR 78 | Temp 97.3°F | Resp 16 | Ht 70.0 in | Wt 227.0 lb

## 2020-03-15 DIAGNOSIS — E1169 Type 2 diabetes mellitus with other specified complication: Secondary | ICD-10-CM

## 2020-03-15 DIAGNOSIS — E785 Hyperlipidemia, unspecified: Secondary | ICD-10-CM

## 2020-03-15 DIAGNOSIS — Z125 Encounter for screening for malignant neoplasm of prostate: Secondary | ICD-10-CM | POA: Diagnosis not present

## 2020-03-15 DIAGNOSIS — Z0000 Encounter for general adult medical examination without abnormal findings: Secondary | ICD-10-CM

## 2020-03-15 LAB — LIPID PANEL
Cholesterol: 150 mg/dL (ref 0–200)
HDL: 51 mg/dL (ref >39.00)
NonHDL: 98.71
Total CHOL/HDL Ratio: 3
Triglycerides: 320 mg/dL — ABNORMAL HIGH (ref 0.0–149.0)
VLDL: 64 mg/dL — ABNORMAL HIGH (ref 0.0–40.0)

## 2020-03-15 LAB — CBC WITH DIFFERENTIAL/PLATELET
Basophils Absolute: 0.1 10*3/uL (ref 0.0–0.1)
Basophils Relative: 0.8 % (ref 0.0–3.0)
Eosinophils Absolute: 0.3 10*3/uL (ref 0.0–0.7)
Eosinophils Relative: 3.2 % (ref 0.0–5.0)
HCT: 44.9 % (ref 39.0–52.0)
Hemoglobin: 15.3 g/dL (ref 13.0–17.0)
Lymphocytes Relative: 31.1 % (ref 12.0–46.0)
Lymphs Abs: 2.9 10*3/uL (ref 0.7–4.0)
MCHC: 34 g/dL (ref 30.0–36.0)
MCV: 92.7 fl (ref 78.0–100.0)
Monocytes Absolute: 0.7 10*3/uL (ref 0.1–1.0)
Monocytes Relative: 8 % (ref 3.0–12.0)
Neutro Abs: 5.2 10*3/uL (ref 1.4–7.7)
Neutrophils Relative %: 56.9 % (ref 43.0–77.0)
Platelets: 352 10*3/uL (ref 150.0–400.0)
RBC: 4.84 Mil/uL (ref 4.22–5.81)
RDW: 12.8 % (ref 11.5–15.5)
WBC: 9.2 10*3/uL (ref 4.0–10.5)

## 2020-03-15 LAB — HEPATIC FUNCTION PANEL
ALT: 35 U/L (ref 0–53)
AST: 23 U/L (ref 0–37)
Albumin: 4.8 g/dL (ref 3.5–5.2)
Alkaline Phosphatase: 50 U/L (ref 39–117)
Bilirubin, Direct: 0.1 mg/dL (ref 0.0–0.3)
Total Bilirubin: 0.6 mg/dL (ref 0.2–1.2)
Total Protein: 7.8 g/dL (ref 6.0–8.3)

## 2020-03-15 LAB — BASIC METABOLIC PANEL
BUN: 18 mg/dL (ref 6–23)
CO2: 31 mEq/L (ref 19–32)
Calcium: 11.2 mg/dL — ABNORMAL HIGH (ref 8.4–10.5)
Chloride: 97 mEq/L (ref 96–112)
Creatinine, Ser: 0.96 mg/dL (ref 0.40–1.50)
GFR: 79.86 mL/min (ref >60.00)
Glucose, Bld: 90 mg/dL (ref 70–99)
Potassium: 4.5 mEq/L (ref 3.5–5.1)
Sodium: 138 mEq/L (ref 135–145)

## 2020-03-15 LAB — TSH: TSH: 0.74 u[IU]/mL (ref 0.35–4.50)

## 2020-03-15 LAB — PSA: PSA: 2.64 ng/mL (ref 0.10–4.00)

## 2020-03-15 LAB — LDL CHOLESTEROL, DIRECT: Direct LDL: 72 mg/dL

## 2020-03-15 NOTE — Assessment & Plan Note (Signed)
Chronic problem.  Check labs.  Adjust meds prn  

## 2020-03-15 NOTE — Patient Instructions (Addendum)
Follow up in 6 months to recheck BP and cholesterol We'll notify you of your lab results and make any changes if needed Continue to work on healthy diet and regular exercise- you can do it! Please consider getting your COVID vaccines Call with any questions or concerns Stay Safe!  Stay Healthy!

## 2020-03-15 NOTE — Assessment & Plan Note (Signed)
Pt's PE WNL w/ exception of obesity.  UTD on colonoscopy, Tdap.  Due for flu and COVID- defers at this time.  Check labs.  Anticipatory guidance provided.

## 2020-03-15 NOTE — Progress Notes (Signed)
   Subjective:    Patient ID: Kevin Barnes, male    DOB: [birth year], 60 y.o.   MRN: [id number]  HPI CPE- UTD on colonoscopy, Tdap.  Due for flu and COVID vaccines.  Would like to hold off on flu shot for now.  Reviewed past medical, surgical, family and social histories.   Health Maintenance  Topic Date Due  . PNEUMOCOCCAL POLYSACCHARIDE VACCINE AGE 4-64 HIGH RISK  Never done  . INFLUENZA VACCINE  02/21/2020  . FOOT EXAM  03/01/2020  . COVID-19 Vaccine (1) 03/31/2020 (Originally 01[birth year]19/1972)  . Hepatitis C Screening  08/30/2020 (Originally 1961-04-12)  . HIV Screening  08/30/2020 (Originally 01/19/1975)  . HEMOGLOBIN A1C  06/01/2020  . OPHTHALMOLOGY EXAM  08/23/2020  . COLONOSCOPY  01/09/2024  . TETANUS/TDAP  01/08/2026     Patient Care Team    Relationship Specialty Notifications Start End  Midge Minium, MD PCP - General Family Medicine  10/19/15   Rana Snare, MD Consulting Physician Urology  01/29/17   Elayne Snare, MD Consulting Physician Endocrinology  01/29/17   Dermatology, [facility] Physician   03/02/19       Review of Systems Patient reports no vision/hearing changes, anorexia, fever ,adenopathy, persistant/recurrent hoarseness, swallowing issues, chest pain, palpitations, edema, persistant/recurrent cough, hemoptysis, dyspnea (rest,exertional, paroxysmal nocturnal), gastrointestinal  bleeding (melena, rectal bleeding), abdominal pain, excessive heart burn, GU symptoms (dysuria, hematuria, voiding/incontinence issues) syncope, focal weakness, memory loss, numbness & tingling, skin/hair/nail changes, depression, anxiety, abnormal bruising/bleeding, musculoskeletal symptoms/signs.   This visit occurred during the SARS-CoV-2 public health emergency.  Safety protocols were in place, including screening questions prior to the visit, additional usage of staff PPE, and extensive cleaning of exam room while observing appropriate contact time as indicated for  disinfecting solutions.       Objective:   Physical Exam General Appearance:    Alert, cooperative, no distress, appears stated age  Head:    Normocephalic, without obvious abnormality, atraumatic  Eyes:    PERRL, conjunctiva/corneas clear, EOM's intact, fundi    benign, both eyes       Ears:    Normal TM's and external ear canals, both ears  Nose:   Deferred due to COVID  Throat:   Neck:   Supple, symmetrical, trachea midline, no adenopathy;       thyroid:  No enlargement/tenderness/nodules  Back:     Symmetric, no curvature, ROM normal, no CVA tenderness  Lungs:     Clear to auscultation bilaterally, respirations unlabored  Chest wall:    No tenderness or deformity  Heart:    Regular rate and rhythm, S1 and S2 normal, no murmur, rub   or gallop  Abdomen:     Soft, non-tender, bowel sounds active all four quadrants,    no masses, no organomegaly  Genitalia:    deferred  Rectal:    Extremities:   Extremities normal, atraumatic, no cyanosis or edema  Pulses:   2+ and symmetric all extremities  Skin:   Skin color, texture, turgor normal, no rashes or lesions  Lymph nodes:   Cervical, supraclavicular, and axillary nodes normal  Neurologic:   CNII-XII intact. Normal strength, sensation and reflexes      throughout          Assessment & Plan:

## 2020-03-16 ENCOUNTER — Other Ambulatory Visit: Payer: Self-pay | Admitting: Family Medicine

## 2020-03-22 ENCOUNTER — Other Ambulatory Visit: Payer: Self-pay | Admitting: Endocrinology

## 2020-03-22 ENCOUNTER — Other Ambulatory Visit: Payer: Self-pay | Admitting: Family Medicine

## 2020-03-22 DIAGNOSIS — I10 Essential (primary) hypertension: Secondary | ICD-10-CM

## 2020-03-22 DIAGNOSIS — E119 Type 2 diabetes mellitus without complications: Secondary | ICD-10-CM

## 2020-04-04 ENCOUNTER — Other Ambulatory Visit: Payer: 59

## 2020-04-05 ENCOUNTER — Other Ambulatory Visit: Payer: Self-pay

## 2020-04-05 ENCOUNTER — Other Ambulatory Visit (INDEPENDENT_AMBULATORY_CARE_PROVIDER_SITE_OTHER): Payer: 59

## 2020-04-05 ENCOUNTER — Encounter: Payer: Self-pay | Admitting: Family Medicine

## 2020-04-05 DIAGNOSIS — E1165 Type 2 diabetes mellitus with hyperglycemia: Secondary | ICD-10-CM

## 2020-04-05 LAB — BASIC METABOLIC PANEL
BUN: 18 mg/dL (ref 6–23)
CO2: 29 mEq/L (ref 19–32)
Calcium: 10.1 mg/dL (ref 8.4–10.5)
Chloride: 99 mEq/L (ref 96–112)
Creatinine, Ser: 1.03 mg/dL (ref 0.40–1.50)
GFR: 73.61 mL/min (ref >60.00)
Glucose, Bld: 137 mg/dL — ABNORMAL HIGH (ref 70–99)
Potassium: 4.5 mEq/L (ref 3.5–5.1)
Sodium: 137 mEq/L (ref 135–145)

## 2020-04-05 LAB — MICROALBUMIN / CREATININE URINE RATIO
Creatinine,U: 87.6 mg/dL
Microalb Creat Ratio: 2 mg/g (ref 0.0–30.0)
Microalb, Ur: 1.7 mg/dL (ref 0.0–1.9)

## 2020-04-05 LAB — HEMOGLOBIN A1C: Hgb A1c MFr Bld: 6.9 % — ABNORMAL HIGH (ref 4.6–6.5)

## 2020-04-07 ENCOUNTER — Other Ambulatory Visit: Payer: Self-pay | Admitting: Family Medicine

## 2020-04-12 ENCOUNTER — Other Ambulatory Visit: Payer: Self-pay

## 2020-04-12 ENCOUNTER — Ambulatory Visit: Payer: 59 | Admitting: Endocrinology

## 2020-04-12 VITALS — BP 142/88 | HR 71 | Temp 97.8°F | Ht 70.0 in | Wt 228.0 lb

## 2020-04-12 DIAGNOSIS — I10 Essential (primary) hypertension: Secondary | ICD-10-CM | POA: Diagnosis not present

## 2020-04-12 DIAGNOSIS — E782 Mixed hyperlipidemia: Secondary | ICD-10-CM

## 2020-04-12 DIAGNOSIS — E1165 Type 2 diabetes mellitus with hyperglycemia: Secondary | ICD-10-CM | POA: Diagnosis not present

## 2020-04-12 NOTE — Patient Instructions (Signed)
Check blood sugars on waking up 3-4 days a week  Also check blood sugars about 2 hours after meals and do this after different meals by rotation  Recommended blood sugar levels on waking up are 90-130 and about 2 hours after meal is 130-160  Please bring your blood sugar monitor to each visit, thank you   

## 2020-04-12 NOTE — Progress Notes (Signed)
Patient ID: Kevin Barnes, male   DOB: 03/18/1961, 59 y.o.   MRN: [id number]            Reason for Appointment: Follow-up for Type 2 Diabetes  Referring physician: Birdie Riddle   History of Present Illness:          Date of diagnosis of type 2 diabetes mellitus:  2013      Background history:  He was diagnosed to have diabetes with a glucose of 132 in 2013 and baseline A1c of 6.4 He was started on metformin in 01/2013 when blood sugars were higher His blood sugars were reasonably controlled in the first 2 years or so, he was also given glipizide initially in 2015 but this caused hypoglycemia and he did not continue this. He had been followed up relatively sporadically He was also tried on Januvia subsequently in 2017 when his blood sugars were higher but this was changed to Slick  He did not take this because of the cost   A1c baseline in February 2018 was 9.2  Recent history:   His A1c is 6.9, has been down to 6.6   Non-insulin hypoglycemic drugs the patient is taking are:  Jardiance 25 mg daily in evening, Metformin 1g bid, Amaryl 1 mg at suppertime  Current management, blood sugar patterns and problems identified:  He has not brought his meter for download again  He thinks he is checking readings after meals and they are usually about 180, likely checking couple of times a week  He is not always cutting back on carbohydrate portions and he thinks he can do better  Still not able to lose weight  He has recently started walking for exercise  He thinks his fasting blood sugars are about 120, was 137 in the lab  No hypoglycemic symptoms during the day and blood sugar was 90 when checked before lunch in the lab with PCP  He prefers to take Jardiance at dinnertime        Side effects from medications have : None, tries to take metformin with food  Hypoglycemia:  he feels low sugars if the blood sugar is <90  Glucose monitoring:  done 1 times a day         Glucometer: One  Touch Verio .       Blood Glucose readings by recall as above   Self-care: The diet that the patient has been following is: tries to limit Drinks with sugar .     Meal times are:  Breakfast is at 7 am Lunch: Variable Dinner: 7 pm    Typical meal intake: Breakfast is small and may not always have a protein.  He will have  some times a day biscuit late morning His snacks will be peanut butter crackers or chips.                Dietician visit, most recent: 11/19/16               Weight history:  Wt Readings from Last 3 Encounters:  04/12/20 228 lb (103.4 kg)  03/15/20 227 lb (103 kg)  12/07/19 230 lb 3.2 oz (104.4 kg)    Glycemic control:   Lab Results  Component Value Date   HGBA1C 6.9 (H) 04/05/2020   HGBA1C 6.8 (H) 11/30/2019   HGBA1C 6.9 (H) 08/24/2019   Lab Results  Component Value Date   MICROALBUR 1.7 04/05/2020   LDLCALC 63 08/31/2019   CREATININE 1.03 04/05/2020   Lab Results  Component Value Date   MICRALBCREAT 2.0 04/05/2020    Lab Results  Component Value Date   FRUCTOSAMINE 295 (H) 11/19/2016      Allergies as of 04/12/2020   No Known Allergies     Medication List       Accurate as of April 12, 2020  9:04 AM. If you have any questions, ask your nurse or doctor.        aspirin 81 MG tablet Take 81 mg by mouth daily.   atorvastatin 20 MG tablet Commonly known as: LIPITOR TAKE 1 TABLET BY MOUTH ONCE DAILY IN THE EVENING . APPOINTMENT REQUIRED FOR FUTURE REFILLS   B-12 5000 MCG Subl Place under the tongue.   butalbital-acetaminophen-caffeine 50-325-40 MG tablet Commonly known as: FIORICET Take 1 tablet by mouth daily as needed for headache.   cholecalciferol 1000 units tablet Commonly known as: VITAMIN D Take 2,000 Units by mouth daily.   fenofibrate 160 MG tablet Take 1 tablet by mouth once daily   glimepiride 1 MG tablet Commonly known as: AMARYL TAKE 1 TABLET BY MOUTH ONCE DAILY BEFORE SUPPER   Jardiance 25 MG Tabs  tablet Generic drug: empagliflozin Take 1 tablet by mouth once daily   lisinopril-hydrochlorothiazide 20-25 MG tablet Commonly known as: ZESTORETIC Take 1 tablet by mouth once daily   metFORMIN 1000 MG tablet Commonly known as: GLUCOPHAGE TAKE 1 TABLET BY MOUTH TWICE DAILY WITH A MEAL   Omega-3 Krill Oil 1000 MG Caps Take by mouth.   OneTouch Verio test strip Generic drug: glucose blood USE 1 STRIP 1 TO CHECK SUGAR 1-2 TIMES DAILY   OVER THE COUNTER MEDICATION Sunny Mood and Curamin   SALMON OIL PO Take by mouth as directed.   sildenafil 50 MG tablet Commonly known as: VIAGRA TAKE 2 TABLETS BY MOUTH AS NEEDED FOR ERECTILE DYSFUNCTION       Allergies: No Known Allergies  Past Medical History:  Diagnosis Date  . Allergy   . Basal cell carcinoma of cheek 2016  . Diabetes mellitus   . Hyperlipidemia   . Hypertension   . Obesity   . Obesity     Past Surgical History:  Procedure Laterality Date  . PROSTATE SURGERY      Family History  Problem Relation Age of Onset  . Cancer Father        Prostate  . Heart disease Father   . Hyperlipidemia Father   . Hypertension Father   . Heart failure Mother   . Stroke Paternal Grandfather   . Diabetes Neg Hx     Social History:  reports that he has quit smoking. He has never used smokeless tobacco. He reports current alcohol use of about 1.0 standard drink of alcohol per week. He reports that he does not use drugs.   Review of Systems  Lipid history: He has had mostly high triglycerides  He is taking fenofibrate in addition to his Lipitor  This is followed by PCP and cardiologist  Recent labs were done nonfasting in the early afternoon He thinks he has not consistent with diet with increased carbohydrate intake although he thinks he is cutting back on alcohol intake   Lab Results  Component Value Date   CHOL 150 03/15/2020   HDL 51.00 03/15/2020   LDLCALC 63 08/31/2019   LDLDIRECT 72.0 03/15/2020   TRIG  320.0 (H) 03/15/2020   CHOLHDL 3 03/15/2020           Hypertension: Has had this for several years,  taking lisinopril HCTZ   Usually not checking blood pressure at home, he says he was rushing in today and did not take his medication   BP Readings from Last 3 Encounters:  04/12/20 (!) 142/88  03/15/20 126/74  12/07/19 130/70   Hypercalcemia: His calcium was high on his initial consultation and also when seen by PCP last month Repeat calcium is upper normal now but has been below 10  PTH not elevated in the past  He does take HCTZ 25 mg daily  Lab Results  Component Value Date   CALCIUM 10.1 04/05/2020   Lab Results  Component Value Date   PTH 21 09/24/2017   CALCIUM 10.1 04/05/2020     Most recent eye exam was 2/21  Most recent foot exam:8/21  He is currently refusing to take the Covid vaccine, has had clinical infection in 2020  LABS:  No visits with results within 1 Week(s) from this visit.  Latest known visit with results is:  Lab on 04/05/2020  Component Date Value Ref Range Status  . Microalb, Ur 04/05/2020 1.7  0.0 - 1.9 mg/dL Final  . Creatinine,U 04/05/2020 87.6  mg/dL Final  . Microalb Creat Ratio 04/05/2020 2.0  0.0 - 30.0 mg/g Final  . Sodium 04/05/2020 137  135 - 145 mEq/L Final  . Potassium 04/05/2020 4.5  3.5 - 5.1 mEq/L Final  . Chloride 04/05/2020 99  96 - 112 mEq/L Final  . CO2 04/05/2020 29  19 - 32 mEq/L Final  . Glucose, Bld 04/05/2020 137* 70 - 99 mg/dL Final  . BUN 04/05/2020 18  6 - 23 mg/dL Final  . Creatinine, Ser 04/05/2020 1.03  0.40 - 1.50 mg/dL Final  . GFR 04/05/2020 73.61  >60.00 mL/min Final  . Calcium 04/05/2020 10.1  8.4 - 10.5 mg/dL Final  . Hgb A1c MFr Bld 04/05/2020 6.9* 4.6 - 6.5 % Final   Glycemic Control Guidelines for People with Diabetes:Non Diabetic:  <6%Goal of Therapy: <7%Additional Action Suggested:  >8%     Physical Examination:  BP (!) 142/88 (BP Location: Left Arm, Patient Position: Sitting, Cuff Size:  Normal)   Pulse 71   Temp 97.8 F (36.6 C) (Oral)   Ht 5\' 10"  (1.778 m)   Wt 228 lb (103.4 kg)   SpO2 98%   BMI 32.71 kg/m       ASSESSMENT:  Diabetes type 2 non-insulin-dependent  See history of present illness for detailed discussion of current diabetes management, blood sugar patterns and problems identified  His A1c is 6.9, has been as low as 6.6 in the past  He is taking Metformin, Jardiance 25 mg and Amaryl  He has difficulty losing weight Has not brought his monitor for download and not clear how often he has high blood sugars He is feels that he can cut back on his carbohydrates and be consistent with his exercise   HYPERTENSION: Well controlled Also microalbumin is normal  History of abnormal calcium levels: Not clear if this is related to HCTZ, unlikely to be from hyperparathyroidism since PTH was 21 May also be artifact from blood drawing technique  Lipids: Followed by PCP, triglycerides were significantly higher despite taking fenofibrate although not fasting that day    PLAN:   He will stay on above medication regimen for now Consider Rybelsus for Trulicity if blood sugars tend to go up or if he has consistently high postprandial readings He needs to cut back on portions of carbohydrates, increase salads and vegetables Consistent walking  for exercise  He will call if he is having high readings To bring monitor on the next visit for download  He was recommended 75-month follow-up but he wants to wait 6 months  He will try to get fasting labs done with PCP for triglycerides, discussed the dietary changes will be helpful for triglycerides as well as consistent avoidance of excess alcohol May be a candidate for Vascepa if triglycerides stay consistently high  Reassured him about the safety of Covid vaccine, need to be protected against delta variant especially with his underlying diabetes and strongly encouraged him to take the shots.  Also advised him to  use a surgical mask instead of the cloth mask that he has been using  There are no Patient Instructions on file for this visit.   Elayne Snare 04/12/2020, 9:04 AM   Note: This office note was prepared with Dragon voice recognition system technology. Any transcriptional errors that result from this process are unintentional.  This visit occurred during the SARS-CoV-2 public health emergency.  Safety protocols were in place, including screening questions prior to the visit, additional usage of staff PPE, and extensive cleaning of exam room while observing appropriate contact time as indicated for disinfecting solutions.

## 2020-05-24 ENCOUNTER — Other Ambulatory Visit: Payer: Self-pay | Admitting: Family Medicine

## 2020-06-06 ENCOUNTER — Other Ambulatory Visit: Payer: Self-pay | Admitting: Endocrinology

## 2020-06-24 ENCOUNTER — Other Ambulatory Visit: Payer: Self-pay | Admitting: Family Medicine

## 2020-06-24 DIAGNOSIS — I10 Essential (primary) hypertension: Secondary | ICD-10-CM

## 2020-06-24 DIAGNOSIS — E119 Type 2 diabetes mellitus without complications: Secondary | ICD-10-CM

## 2020-06-27 ENCOUNTER — Other Ambulatory Visit: Payer: Self-pay | Admitting: Family Medicine

## 2020-06-27 ENCOUNTER — Other Ambulatory Visit: Payer: Self-pay | Admitting: Endocrinology

## 2020-06-27 DIAGNOSIS — E119 Type 2 diabetes mellitus without complications: Secondary | ICD-10-CM

## 2020-06-28 ENCOUNTER — Other Ambulatory Visit: Payer: Self-pay | Admitting: Family Medicine

## 2020-06-28 DIAGNOSIS — N529 Male erectile dysfunction, unspecified: Secondary | ICD-10-CM

## 2020-07-12 ENCOUNTER — Other Ambulatory Visit: Payer: Self-pay | Admitting: Family Medicine

## 2020-07-26 ENCOUNTER — Other Ambulatory Visit: Payer: Self-pay | Admitting: Family Medicine

## 2020-08-23 ENCOUNTER — Other Ambulatory Visit: Payer: Self-pay | Admitting: Endocrinology

## 2020-08-25 ENCOUNTER — Other Ambulatory Visit: Payer: Self-pay | Admitting: Endocrinology

## 2020-08-31 ENCOUNTER — Other Ambulatory Visit: Payer: Self-pay | Admitting: Family Medicine

## 2020-09-19 ENCOUNTER — Other Ambulatory Visit: Payer: Self-pay

## 2020-09-19 ENCOUNTER — Encounter: Payer: Self-pay | Admitting: Family Medicine

## 2020-09-19 ENCOUNTER — Ambulatory Visit: Payer: 59 | Admitting: Family Medicine

## 2020-09-19 VITALS — BP 130/80 | HR 68 | Temp 97.9°F | Resp 19 | Ht 70.0 in | Wt 227.6 lb

## 2020-09-19 DIAGNOSIS — I10 Essential (primary) hypertension: Secondary | ICD-10-CM

## 2020-09-19 DIAGNOSIS — E1169 Type 2 diabetes mellitus with other specified complication: Secondary | ICD-10-CM | POA: Diagnosis not present

## 2020-09-19 DIAGNOSIS — E669 Obesity, unspecified: Secondary | ICD-10-CM | POA: Diagnosis not present

## 2020-09-19 DIAGNOSIS — E785 Hyperlipidemia, unspecified: Secondary | ICD-10-CM | POA: Diagnosis not present

## 2020-09-19 LAB — TSH: TSH: 0.63 u[IU]/mL (ref 0.35–4.50)

## 2020-09-19 LAB — HEPATIC FUNCTION PANEL
ALT: 36 U/L (ref 0–53)
AST: 25 U/L (ref 0–37)
Albumin: 4.4 g/dL (ref 3.5–5.2)
Alkaline Phosphatase: 45 U/L (ref 39–117)
Bilirubin, Direct: 0.1 mg/dL (ref 0.0–0.3)
Total Bilirubin: 0.4 mg/dL (ref 0.2–1.2)
Total Protein: 7.8 g/dL (ref 6.0–8.3)

## 2020-09-19 LAB — CBC WITH DIFFERENTIAL/PLATELET
Basophils Absolute: 0.1 10*3/uL (ref 0.0–0.1)
Basophils Relative: 1.1 % (ref 0.0–3.0)
Eosinophils Absolute: 0.4 10*3/uL (ref 0.0–0.7)
Eosinophils Relative: 4.9 % (ref 0.0–5.0)
HCT: 43.7 % (ref 39.0–52.0)
Hemoglobin: 15.1 g/dL (ref 13.0–17.0)
Lymphocytes Relative: 33 % (ref 12.0–46.0)
Lymphs Abs: 2.6 10*3/uL (ref 0.7–4.0)
MCHC: 34.5 g/dL (ref 30.0–36.0)
MCV: 92 fl (ref 78.0–100.0)
Monocytes Absolute: 0.6 10*3/uL (ref 0.1–1.0)
Monocytes Relative: 7.8 % (ref 3.0–12.0)
Neutro Abs: 4.2 10*3/uL (ref 1.4–7.7)
Neutrophils Relative %: 53.2 % (ref 43.0–77.0)
Platelets: 330 10*3/uL (ref 150.0–400.0)
RBC: 4.75 Mil/uL (ref 4.22–5.81)
RDW: 12.6 % (ref 11.5–15.5)
WBC: 7.9 10*3/uL (ref 4.0–10.5)

## 2020-09-19 LAB — BASIC METABOLIC PANEL
BUN: 16 mg/dL (ref 6–23)
CO2: 28 mEq/L (ref 19–32)
Calcium: 10.3 mg/dL (ref 8.4–10.5)
Chloride: 97 mEq/L (ref 96–112)
Creatinine, Ser: 0.86 mg/dL (ref 0.40–1.50)
GFR: 94.01 mL/min (ref >60.00)
Glucose, Bld: 113 mg/dL — ABNORMAL HIGH (ref 70–99)
Potassium: 4.3 mEq/L (ref 3.5–5.1)
Sodium: 134 mEq/L — ABNORMAL LOW (ref 135–145)

## 2020-09-19 LAB — LIPID PANEL
Cholesterol: 151 mg/dL (ref 0–200)
HDL: 46.9 mg/dL (ref >39.00)
NonHDL: 104.16
Total CHOL/HDL Ratio: 3
Triglycerides: 246 mg/dL — ABNORMAL HIGH (ref 0.0–149.0)
VLDL: 49.2 mg/dL — ABNORMAL HIGH (ref 0.0–40.0)

## 2020-09-19 LAB — LDL CHOLESTEROL, DIRECT: Direct LDL: 87 mg/dL

## 2020-09-19 NOTE — Assessment & Plan Note (Signed)
Chronic problem.  BMI stable at 32.66  Discussed need for healthy diet and regular exercise.  Will continue to follow.

## 2020-09-19 NOTE — Assessment & Plan Note (Signed)
Chronic problem, on Lipitor 20mg  and Fenofibrate 160mg  daily w/o difficulty.  Check labs.  Adjust meds prn

## 2020-09-19 NOTE — Assessment & Plan Note (Signed)
Chronic problem.  On Lisinopril HCTZ 20/25mg  daily.  In August BP was 126/74.  Pt reports he is under quite a bit of stress right now which may be contributing.  Will continue to follow and if BP remains elevated will need to adjust medicine.

## 2020-09-19 NOTE — Patient Instructions (Signed)
Schedule your complete physical in 6 months We'll notify you of your lab results and make any changes if needed Continue to work on healthy diet and regular exercise- you can do it! Call and schedule your eye exam at your convenience Call with any questions or concerns Stay Safe!  Stay Healthy!

## 2020-09-19 NOTE — Progress Notes (Signed)
   Subjective:    Patient ID: Kevin Barnes, male    DOB: Apr 03, 1961, 60 y.o.   MRN: [id number]  HPI HTN- chronic problem, on Lisinopril HCTZ 20/25mg  daily.  No CP, SOB, HAs, visual changes, edema.  Hyperlipidemia- chronic problem, on Lipitor 20mg  daily and Fenofibrate 160mg  daily.  No abd pain, N/V.  Obesity- pt's weight is stable at 227 and BMI is 32.66.  Pt reports he is walking regularly.  Attempting to follow low carb diet.   Review of Systems For ROS see HPI   This visit occurred during the SARS-CoV-2 public health emergency.  Safety protocols were in place, including screening questions prior to the visit, additional usage of staff PPE, and extensive cleaning of exam room while observing appropriate contact time as indicated for disinfecting solutions.       Objective:   Physical Exam Vitals reviewed.  Constitutional:      General: He is not in acute distress.    Appearance: Normal appearance. He is well-developed and well-nourished.  HENT:     Head: Normocephalic and atraumatic.  Eyes:     Extraocular Movements: EOM normal.     Conjunctiva/sclera: Conjunctivae normal.     Pupils: Pupils are equal, round, and reactive to light.  Neck:     Thyroid: No thyromegaly.  Cardiovascular:     Rate and Rhythm: Normal rate and regular rhythm.     Pulses: Normal pulses and intact distal pulses.     Heart sounds: Normal heart sounds. No murmur heard.   Pulmonary:     Effort: Pulmonary effort is normal. No respiratory distress.     Breath sounds: Normal breath sounds.  Abdominal:     General: Bowel sounds are normal. There is no distension.     Palpations: Abdomen is soft.  Musculoskeletal:        General: No edema.     Cervical back: Normal range of motion and neck supple.     Right lower leg: No edema.     Left lower leg: No edema.  Lymphadenopathy:     Cervical: No cervical adenopathy.  Skin:    General: Skin is warm and dry.  Neurological:     Mental Status: He is  alert and oriented to person, place, and time.     Cranial Nerves: No cranial nerve deficit.  Psychiatric:        Mood and Affect: Mood and affect normal.        Behavior: Behavior normal.           Assessment & Plan:

## 2020-10-02 ENCOUNTER — Other Ambulatory Visit: Payer: Self-pay | Admitting: Endocrinology

## 2020-10-02 ENCOUNTER — Other Ambulatory Visit: Payer: Self-pay | Admitting: Family Medicine

## 2020-10-02 DIAGNOSIS — I10 Essential (primary) hypertension: Secondary | ICD-10-CM

## 2020-10-02 DIAGNOSIS — E119 Type 2 diabetes mellitus without complications: Secondary | ICD-10-CM

## 2020-10-02 DIAGNOSIS — N529 Male erectile dysfunction, unspecified: Secondary | ICD-10-CM

## 2020-10-10 ENCOUNTER — Ambulatory Visit: Payer: 59 | Admitting: Endocrinology

## 2020-10-11 ENCOUNTER — Ambulatory Visit: Payer: 59 | Admitting: Endocrinology

## 2020-11-01 ENCOUNTER — Other Ambulatory Visit: Payer: Self-pay | Admitting: Family Medicine

## 2020-12-01 ENCOUNTER — Other Ambulatory Visit: Payer: Self-pay | Admitting: Family Medicine

## 2020-12-06 ENCOUNTER — Other Ambulatory Visit (INDEPENDENT_AMBULATORY_CARE_PROVIDER_SITE_OTHER): Payer: 59

## 2020-12-06 ENCOUNTER — Other Ambulatory Visit: Payer: Self-pay

## 2020-12-06 DIAGNOSIS — E1165 Type 2 diabetes mellitus with hyperglycemia: Secondary | ICD-10-CM

## 2020-12-06 LAB — BASIC METABOLIC PANEL
BUN: 22 mg/dL (ref 6–23)
CO2: 27 mEq/L (ref 19–32)
Calcium: 9.6 mg/dL (ref 8.4–10.5)
Chloride: 99 mEq/L (ref 96–112)
Creatinine, Ser: 0.88 mg/dL (ref 0.40–1.50)
GFR: 93.22 mL/min (ref >60.00)
Glucose, Bld: 146 mg/dL — ABNORMAL HIGH (ref 70–99)
Potassium: 3.8 mEq/L (ref 3.5–5.1)
Sodium: 135 mEq/L (ref 135–145)

## 2020-12-06 LAB — HEMOGLOBIN A1C: Hgb A1c MFr Bld: 7.1 % — ABNORMAL HIGH (ref 4.6–6.5)

## 2020-12-13 ENCOUNTER — Other Ambulatory Visit: Payer: Self-pay

## 2020-12-13 ENCOUNTER — Encounter: Payer: Self-pay | Admitting: Endocrinology

## 2020-12-13 ENCOUNTER — Ambulatory Visit: Payer: 59 | Admitting: Endocrinology

## 2020-12-13 VITALS — BP 140/88 | HR 81 | Ht 70.0 in | Wt 227.6 lb

## 2020-12-13 DIAGNOSIS — E1165 Type 2 diabetes mellitus with hyperglycemia: Secondary | ICD-10-CM | POA: Diagnosis not present

## 2020-12-13 DIAGNOSIS — I10 Essential (primary) hypertension: Secondary | ICD-10-CM

## 2020-12-13 LAB — POCT GLUCOSE (DEVICE FOR HOME USE): Glucose Fasting, POC: 183 mg/dL — AB (ref 70–99)

## 2020-12-13 MED ORDER — ONETOUCH VERIO VI STRP: ORAL_STRIP | 2 refills | Status: DC

## 2020-12-13 MED ORDER — RYBELSUS 3 MG PO TABS: 3.0000 mg | ORAL_TABLET | Freq: Every day | ORAL | 0 refills | Status: DC

## 2020-12-13 NOTE — Progress Notes (Signed)
Patient ID: Kevin Barnes, male   DOB: 1961/03/10, 60 y.o.   MRN: [id number]            Reason for Appointment: Follow-up for Type 2 Diabetes  Referring physician: Birdie Riddle   History of Present Illness:          Date of diagnosis of type 2 diabetes mellitus:  2013      Background history:  He was diagnosed to have diabetes with a glucose of 132 in 2013 and baseline A1c of 6.4 He was started on metformin in 01/2013 when blood sugars were higher His blood sugars were reasonably controlled in the first 2 years or so, he was also given glipizide initially in 2015 but this caused hypoglycemia and he did not continue this. He had been followed up relatively sporadically He was also tried on Januvia subsequently in 2017 when his blood sugars were higher but this was changed to [place]  He did not take this because of the cost   A1c baseline in February 2018 was 9.2  Recent history:   His A1c is 7.1  Non-insulin hypoglycemic drugs the patient is taking are:  Jardiance 25 mg daily in evening, Metformin 1g bid, Amaryl 1 mg at suppertime  Current management, blood sugar patterns and problems identified:  He has not checked blood sugars for a while since he ran out of test strips and did not call for refill  He feels that his blood sugars are higher because of poor meal planning  Today for breakfast he had a cinnamon roll  Glucose is 183 after eating  Fasting blood sugars in the lab was 146  He thinks he is trying to walk some on weekdays and also on weekends  However not able to lose weight  He prefers to take Jardiance at dinnertime        Side effects from medications have : None, tries to take metformin with food  Hypoglycemia:  he feels low sugars if the blood sugar is <90  Glucose monitoring:  done 1 times a day         Glucometer: One Touch Verio .       Blood Glucose readings not available   Self-care: The diet that the patient has been following is: tries to limit  Drinks with sugar .     Meal times are:  Breakfast is at 7 am Lunch: Variable Dinner: 7 pm    Typical meal intake: Breakfast is small and may not always have a protein.  He will have  some times a day biscuit late morning His snacks will be peanut butter crackers or chips.                Dietician visit, most recent: 11/19/16               Weight history:  Wt Readings from Last 3 Encounters:  12/13/20 227 lb 9.6 oz (103.2 kg)  09/19/20 227 lb 9.6 oz (103.2 kg)  04/12/20 228 lb (103.4 kg)    Glycemic control:   Lab Results  Component Value Date   HGBA1C 7.1 (H) 12/06/2020   HGBA1C 6.9 (H) 04/05/2020   HGBA1C 6.8 (H) 11/30/2019   Lab Results  Component Value Date   MICROALBUR 1.7 04/05/2020   LDLCALC 63 08/31/2019   CREATININE 0.88 12/06/2020   Lab Results  Component Value Date   MICRALBCREAT 2.0 04/05/2020    Lab Results  Component Value Date   FRUCTOSAMINE 295 (H)  11/19/2016      Allergies as of 12/13/2020   No Known Allergies     Medication List       Accurate as of Dec 13, 2020 10:07 AM. If you have any questions, ask your nurse or doctor.        atorvastatin 20 MG tablet Commonly known as: LIPITOR TAKE 1 TABLET BY MOUTH IN THE EVENING (APPOINTMENT  NEEDED  FOR  REFILLS)   B-12 5000 MCG Subl Place under the tongue.   cholecalciferol 1000 units tablet Commonly known as: VITAMIN D Take 2,000 Units by mouth daily.   fenofibrate 160 MG tablet Take 1 tablet by mouth once daily   glimepiride 1 MG tablet Commonly known as: AMARYL TAKE 1 TABLET BY MOUTH ONCE DAILY BEFORE SUPPER   Jardiance 25 MG Tabs tablet Generic drug: empagliflozin Take 1 tablet by mouth once daily   lisinopril-hydrochlorothiazide 20-25 MG tablet Commonly known as: ZESTORETIC Take 1 tablet by mouth once daily   metFORMIN 1000 MG tablet Commonly known as: GLUCOPHAGE TAKE 1 TABLET BY MOUTH TWICE DAILY WITH A MEAL   Omega-3 Krill Oil 1000 MG Caps Take by mouth.   OneTouch  Verio test strip Generic drug: glucose blood USE 1 STRIP 1 TO CHECK SUGAR 1-2 TIMES DAILY   OVER THE COUNTER MEDICATION Sunny Mood and Curamin   SALMON OIL PO Take by mouth as directed.   sildenafil 50 MG tablet Commonly known as: VIAGRA TAKE 2 TABLETS BY MOUTH AS NEEDED FOR ERECTILE DYSFUNCTION       Allergies: No Known Allergies  Past Medical History:  Diagnosis Date  . Allergy   . Basal cell carcinoma of cheek 2016  . Diabetes mellitus   . Hyperlipidemia   . Hypertension   . Obesity   . Obesity     Past Surgical History:  Procedure Laterality Date  . PROSTATE SURGERY      Family History  Problem Relation Age of Onset  . Cancer Father        Prostate  . Heart disease Father   . Hyperlipidemia Father   . Hypertension Father   . Heart failure Mother   . Stroke Paternal Grandfather   . Diabetes Neg Hx     Social History:  reports that he has quit smoking. He has never used smokeless tobacco. He reports current alcohol use of about 1.0 standard drink of alcohol per week. He reports that he does not use drugs.   Review of Systems  Lipid history: He has had mostly high triglycerides  He is taking fenofibrate in addition to his Lipitor  This is followed by PCP and cardiologist  Recent labs were done nonfasting in the early afternoon He thinks he has not consistent with diet with increased carbohydrate intake although he thinks he is cutting back on alcohol intake   Lab Results  Component Value Date   CHOL 151 09/19/2020   HDL 46.90 09/19/2020   LDLCALC 63 08/31/2019   LDLDIRECT 87.0 09/19/2020   TRIG 246.0 (H) 09/19/2020   CHOLHDL 3 09/19/2020           Hypertension: Has had this for several years, taking lisinopril HCTZ   Usually not checking blood pressure at home, he says he was rushing in today and did not take his medication   BP Readings from Last 3 Encounters:  12/13/20 140/88  09/19/20 130/80  04/12/20 (!) 142/88   Hypercalcemia: His  calcium was high on his initial consultation and also  when seen by PCP last month Repeat calcium is upper normal now but has been below 10  PTH not elevated in the past  He does take HCTZ 25 mg daily  Lab Results  Component Value Date   CALCIUM 9.6 12/06/2020   Lab Results  Component Value Date   PTH 21 09/24/2017   CALCIUM 9.6 12/06/2020     Most recent eye exam was 2/21  Most recent foot exam:8/21  He is currently refusing to take the Covid vaccine, has had clinical infection in 2020  LABS:  No visits with results within 1 Week(s) from this visit.  Latest known visit with results is:  Lab on 12/06/2020  Component Date Value Ref Range Status  . Sodium 12/06/2020 135  135 - 145 mEq/L Final  . Potassium 12/06/2020 3.8  3.5 - 5.1 mEq/L Final  . Chloride 12/06/2020 99  96 - 112 mEq/L Final  . CO2 12/06/2020 27  19 - 32 mEq/L Final  . Glucose, Bld 12/06/2020 146* 70 - 99 mg/dL Final  . BUN 12/06/2020 22  6 - 23 mg/dL Final  . Creatinine, Ser 12/06/2020 0.88  0.40 - 1.50 mg/dL Final  . GFR 12/06/2020 93.22  >60.00 mL/min Final   Calculated using the CKD-EPI Creatinine Equation (2021)  . Calcium 12/06/2020 9.6  8.4 - 10.5 mg/dL Final  . Hgb A1c MFr Bld 12/06/2020 7.1* 4.6 - 6.5 % Final   Glycemic Control Guidelines for People with Diabetes:Non Diabetic:  <6%Goal of Therapy: <7%Additional Action Suggested:  >8%     Physical Examination:  BP 140/88 (BP Location: Right Arm, Patient Position: Sitting, Cuff Size: Normal)   Pulse 81   Ht 5\' 10"  (1.778 m)   Wt 227 lb 9.6 oz (103.2 kg)   SpO2 98%   BMI 32.66 kg/m       ASSESSMENT:  Diabetes type 2 non-insulin-dependent  See history of present illness for detailed discussion of current diabetes management, blood sugar patterns and problems identified  His A1c is 7.1, has been as low as 6.6 in the past  He is taking Metformin, Jardiance 25 mg and Amaryl  He has difficulty losing weight Has not brought his monitor  for download and  likely has postprandial hyperglycemia such as today Not able to consistently follow his diet  HYPERTENSION: Blood pressure is high normal but he thinks it is from anxiety and he feels that his blood pressure is better at home  Lipids: Followed by PCP, triglycerides were last still not controlled with fenofibrate     PLAN:   He will stay on above medication regimen for now Consider Rybelsus as an option for multiple benefits including long-term control and cardiovascular risk reduction He prefers just to injectable drugs Discussed with the patient the nature of GLP-1 drugs, the actions on insulin secretion, slowing stomach emptying, reduction of appetite and reduced liver glucose production Explained that Rybelsus improves blood sugar control as well as produces weight loss and reduces cardiovascular events. Explained possible side effects especially nausea and vomiting that may occur in the first few days; usually side effects improve with time.  Patient to call if nausea or vomiting does not improve within 2 weeks Instructed to take the capsules on empty stomach 30 minutes before breakfast with 4 ounces of water daily.  Patient education material given  He needs to restart glucose monitoring and will send gastric He will need to modify his diet and cut back on high carbohydrate and high fat foods  Consultation with dietitian would be beneficial and will consider this on next visit To bring monitor on the next visit for download  May be a candidate for Vascepa for better control of triglycerides and will defer to PCP  Check blood pressure regularly and call if persistently high  There are no Patient Instructions on file for this visit.   Elayne Snare 12/13/2020, 10:07 AM   Note: This office note was prepared with Dragon voice recognition system technology. Any transcriptional errors that result from this process are unintentional.  This visit occurred during the  SARS-CoV-2 public health emergency.  Safety protocols were in place, including screening questions prior to the visit, additional usage of staff PPE, and extensive cleaning of exam room while observing appropriate contact time as indicated for disinfecting solutions.   Total visit time including counseling = 30 minutes

## 2020-12-13 NOTE — Patient Instructions (Addendum)
Rybelsus improves blood sugar control as well as can help with weight loss and reduces cardiovascular events. Need to take the capsules on empty stomach 30 minutes before breakfast with 4 ounces of water daily.   You may feel more fullness at mealtimes and try to keep portions at meals smaller Some people may have nausea or even vomiting that may occur in the first few days; usually the symptoms go away with time. Please call if nausea or vomiting does not improve within 2 weeks  Call for Rx for Rybelsus when ready

## 2021-01-02 ENCOUNTER — Other Ambulatory Visit: Payer: Self-pay | Admitting: Endocrinology

## 2021-01-02 ENCOUNTER — Other Ambulatory Visit: Payer: Self-pay | Admitting: Family Medicine

## 2021-01-02 DIAGNOSIS — I10 Essential (primary) hypertension: Secondary | ICD-10-CM

## 2021-01-02 DIAGNOSIS — E119 Type 2 diabetes mellitus without complications: Secondary | ICD-10-CM

## 2021-01-09 ENCOUNTER — Other Ambulatory Visit: Payer: Self-pay

## 2021-01-09 DIAGNOSIS — E1165 Type 2 diabetes mellitus with hyperglycemia: Secondary | ICD-10-CM

## 2021-01-09 MED ORDER — RYBELSUS 7 MG PO TABS
7.0000 mg | ORAL_TABLET | Freq: Every day | ORAL | 0 refills | Status: DC
Start: 2021-01-09 — End: 2021-02-14

## 2021-01-18 ENCOUNTER — Encounter: Payer: Self-pay | Admitting: *Deleted

## 2021-01-27 ENCOUNTER — Other Ambulatory Visit: Payer: Self-pay | Admitting: Endocrinology

## 2021-01-29 ENCOUNTER — Other Ambulatory Visit: Payer: Self-pay | Admitting: Family Medicine

## 2021-01-30 ENCOUNTER — Other Ambulatory Visit: Payer: Self-pay | Admitting: Family Medicine

## 2021-01-30 DIAGNOSIS — N529 Male erectile dysfunction, unspecified: Secondary | ICD-10-CM

## 2021-02-13 ENCOUNTER — Other Ambulatory Visit: Payer: Self-pay | Admitting: Endocrinology

## 2021-02-13 DIAGNOSIS — E1165 Type 2 diabetes mellitus with hyperglycemia: Secondary | ICD-10-CM

## 2021-03-03 ENCOUNTER — Other Ambulatory Visit: Payer: Self-pay | Admitting: Family Medicine

## 2021-03-14 ENCOUNTER — Other Ambulatory Visit (INDEPENDENT_AMBULATORY_CARE_PROVIDER_SITE_OTHER): Payer: 59

## 2021-03-14 ENCOUNTER — Other Ambulatory Visit: Payer: Self-pay

## 2021-03-14 DIAGNOSIS — E1165 Type 2 diabetes mellitus with hyperglycemia: Secondary | ICD-10-CM | POA: Diagnosis not present

## 2021-03-14 LAB — BASIC METABOLIC PANEL
BUN: 20 mg/dL (ref 6–23)
CO2: 27 mEq/L (ref 19–32)
Calcium: 10.3 mg/dL (ref 8.4–10.5)
Chloride: 97 mEq/L (ref 96–112)
Creatinine, Ser: 0.9 mg/dL (ref 0.40–1.50)
GFR: 92.41 mL/min (ref >60.00)
Glucose, Bld: 168 mg/dL — ABNORMAL HIGH (ref 70–99)
Potassium: 4 mEq/L (ref 3.5–5.1)
Sodium: 134 mEq/L — ABNORMAL LOW (ref 135–145)

## 2021-03-14 LAB — HEMOGLOBIN A1C: Hgb A1c MFr Bld: 8.2 % — ABNORMAL HIGH (ref 4.6–6.5)

## 2021-03-20 ENCOUNTER — Encounter: Payer: 59 | Admitting: Family Medicine

## 2021-03-21 ENCOUNTER — Other Ambulatory Visit: Payer: Self-pay

## 2021-03-21 ENCOUNTER — Ambulatory Visit: Payer: 59 | Admitting: Endocrinology

## 2021-03-21 ENCOUNTER — Encounter: Payer: Self-pay | Admitting: Endocrinology

## 2021-03-21 VITALS — BP 132/80 | HR 72 | Ht 70.5 in | Wt 214.0 lb

## 2021-03-21 DIAGNOSIS — E1165 Type 2 diabetes mellitus with hyperglycemia: Secondary | ICD-10-CM | POA: Diagnosis not present

## 2021-03-21 NOTE — Progress Notes (Signed)
Patient ID: Kevin Barnes, male   DOB: 1960-11-11, 60 y.o.   MRN: [id number]            Reason for Appointment: Follow-up for Type 2 Diabetes  Referring physician: Birdie Riddle   History of Present Illness:          Date of diagnosis of type 2 diabetes mellitus:  2013      Background history:  He was diagnosed to have diabetes with a glucose of 132 in 2013 and baseline A1c of 6.4 He was started on metformin in 01/2013 when blood sugars were higher His blood sugars were reasonably controlled in the first 2 years or so, he was also given glipizide initially in 2015 but this caused hypoglycemia and he did not continue this. He had been followed up relatively sporadically He was also tried on Januvia subsequently in 2017 when his blood sugars were higher but this was changed to [place]  He did not take this because of the cost   A1c baseline in February 2018 was 9.2  Recent history:   His A1c is 8.2 vs 7.1  Non-insulin hypoglycemic drugs the patient is taking are:  Jardiance 25 mg daily in evening, Metformin 1g bid, Amaryl 1 mg at suppertime  Current management, blood sugar patterns and problems identified: He was recommended Rybelsus on his last visit in 5/24 to help with more consistent control as well as weight loss and long-term cardiovascular benefit  He says he started taking the Rybelsus and went up to the 7 mg as directed  However likely not checking his blood sugars until recently as his monitor has only 3 readings in the first half of August  Since his blood sugars were running close to 200 in the morning he went back to taking Amaryl which he had discontinued with starting Rybelsus  Since then his blood sugars have been gradually improving and now in the 140s in the mornings and not any higher later in the day except on 8/27 after lunch  He is likely watching his diet much better and has finally lost significant amount of weight  has not checked blood sugars for a while since he  ran out of test strips and did not call for refill He feels that his blood sugars are higher because of poor meal planning He takes his Amaryl and Jardiance in the evening and has been regular with the metformin twice a day Fasting blood sugars in the lab was 168 He is trying to walk on weekdays and also up to 4 miles on weekends        Side effects from medications have : None, tries to take metformin with food  Hypoglycemia:  he feels low sugars if the blood sugar is <90  Glucose monitoring:  done 1 times a day         Glucometer: One Touch Verio .       Blood Glucose readings    PRE-MEAL Fasting Lunch Dinner Bedtime Overall  Glucose range: 140-215    101-237  Mean/median:     162   POST-MEAL PC Breakfast PC Lunch PC Dinner  Glucose range:   237  Mean/median:        Self-care: The diet that the patient has been following is: tries to limit Drinks with sugar .     Meal times are:  Breakfast is at 7 am Lunch: Variable Dinner: 7 pm    Typical meal intake: Breakfast is small and may not  always have a protein.  He will have  some times a day biscuit late morning His snacks will be peanut butter crackers or chips.                Dietician visit, most recent: 11/19/16               Weight history:  Wt Readings from Last 3 Encounters:  03/21/21 214 lb (97.1 kg)  12/13/20 227 lb 9.6 oz (103.2 kg)  09/19/20 227 lb 9.6 oz (103.2 kg)    Glycemic control:   Lab Results  Component Value Date   HGBA1C 8.2 (H) 03/14/2021   HGBA1C 7.1 (H) 12/06/2020   HGBA1C 6.9 (H) 04/05/2020   Lab Results  Component Value Date   MICROALBUR 1.7 04/05/2020   LDLCALC 63 08/31/2019   CREATININE 0.90 03/14/2021   Lab Results  Component Value Date   MICRALBCREAT 2.0 04/05/2020    Lab Results  Component Value Date   FRUCTOSAMINE 295 (H) 11/19/2016      Allergies as of 03/21/2021   No Known Allergies      Medication List        Accurate as of March 21, 2021  8:54 AM. If you  have any questions, ask your nurse or doctor.          atorvastatin 20 MG tablet Commonly known as: LIPITOR TAKE 1 TABLET BY MOUTH IN THE EVENING . APPOINTMENT REQUIRED FOR FUTURE REFILLS   B-12 5000 MCG Subl Place under the tongue.   cholecalciferol 1000 units tablet Commonly known as: VITAMIN D Take 2,000 Units by mouth daily.   fenofibrate 160 MG tablet Take 1 tablet by mouth once daily   glimepiride 1 MG tablet Commonly known as: AMARYL TAKE 1 TABLET BY MOUTH ONCE DAILY BEFORE SUPPER   Jardiance 25 MG Tabs tablet Generic drug: empagliflozin Take 1 tablet by mouth once daily   lisinopril-hydrochlorothiazide 20-25 MG tablet Commonly known as: ZESTORETIC Take 1 tablet by mouth once daily   metFORMIN 1000 MG tablet Commonly known as: GLUCOPHAGE TAKE 1 TABLET BY MOUTH TWICE DAILY WITH A MEAL   Omega-3 Krill Oil 1000 MG Caps Take by mouth.   OneTouch Verio test strip Generic drug: glucose blood USE 1 STRIP 1 TO CHECK SUGAR 1-2 TIMES DAILY   OVER THE COUNTER MEDICATION Sunny Mood and Curamin   Rybelsus 7 MG Tabs Generic drug: Semaglutide Take 1 tablet by mouth daily.   SALMON OIL PO Take by mouth as directed.   sildenafil 50 MG tablet Commonly known as: VIAGRA TAKE 2 TABLETS BY MOUTH AS NEEDED FOR ERECTILE DYSFUNCTION        Allergies: No Known Allergies  Past Medical History:  Diagnosis Date   Allergy    Basal cell carcinoma of cheek 2016   Diabetes mellitus    Hyperlipidemia    Hypertension    Obesity    Obesity     Past Surgical History:  Procedure Laterality Date   PROSTATE SURGERY      Family History  Problem Relation Age of Onset   Cancer Father        Prostate   Heart disease Father    Hyperlipidemia Father    Hypertension Father    Heart failure Mother    Stroke Paternal Grandfather    Diabetes Neg Hx     Social History:  reports that he has quit smoking. He has never used smokeless tobacco. He reports current alcohol use  of about  1.0 standard drink per week. He reports that he does not use drugs.   Review of Systems  Lipid history: He has had mostly high triglycerides  He is taking fenofibrate in addition to his Lipitor  This is followed by PCP and cardiologist Labs as follows   Lab Results  Component Value Date   CHOL 151 09/19/2020   HDL 46.90 09/19/2020   LDLCALC 63 08/31/2019   LDLDIRECT 87.0 09/19/2020   TRIG 246.0 (H) 09/19/2020   CHOLHDL 3 09/19/2020           Hypertension: Has had this for several years, taking lisinopril HCTZ from his PCP   BP Readings from Last 3 Encounters:  03/21/21 132/80  12/13/20 140/88  09/19/20 130/80   Hypercalcemia: His calcium was high on his initial consultation and also on another occasion Subsequent calcium levels have been upper normal  PTH not elevated in the past  He does take HCTZ 25 mg daily  Lab Results  Component Value Date   CALCIUM 10.3 03/14/2021   Lab Results  Component Value Date   PTH 21 09/24/2017   CALCIUM 10.3 03/14/2021     Most recent eye exam was 2/21  Most recent foot exam:8/21  Had been refusing to take the Covid vaccine, has had clinical infection in 2020  LABS:  No visits with results within 1 Week(s) from this visit.  Latest known visit with results is:  Lab on 03/14/2021  Component Date Value Ref Range Status   Sodium 03/14/2021 134 (A) 135 - 145 mEq/L Final   Potassium 03/14/2021 4.0  3.5 - 5.1 mEq/L Final   Chloride 03/14/2021 97  96 - 112 mEq/L Final   CO2 03/14/2021 27  19 - 32 mEq/L Final   Glucose, Bld 03/14/2021 168 (A) 70 - 99 mg/dL Final   BUN 03/14/2021 20  6 - 23 mg/dL Final   Creatinine, Ser 03/14/2021 0.90  0.40 - 1.50 mg/dL Final   GFR 03/14/2021 92.41  >60.00 mL/min Final   Calculated using the CKD-EPI Creatinine Equation (2021)   Calcium 03/14/2021 10.3  8.4 - 10.5 mg/dL Final   Hgb A1c MFr Bld 03/14/2021 8.2 (A) 4.6 - 6.5 % Final   Glycemic Control Guidelines for People with  Diabetes:Non Diabetic:  <6%Goal of Therapy: <7%Additional Action Suggested:  >8%     Physical Examination:  BP 132/80   Pulse 72   Ht 5' 10.5" (1.791 m)   Wt 214 lb (97.1 kg)   SpO2 99%   BMI 30.27 kg/m       ASSESSMENT:  Diabetes type 2 non-insulin-dependent  See history of present illness for detailed discussion of current diabetes management, blood sugar patterns and problems identified  His A1c is 8.2 compared to 7.1, has been as low as 6.6 in the past  He is taking Metformin, Jardiance 25 mg and Amaryl  Although he was tried on Rybelsus the blood sugars did not appear to be improved with this but he has lost significant amount of weight which he could not do before Blood sugar monitoring has been slightly irregular until recently when he saw his blood sugars were higher Monitoring of blood sugars are improving significantly with going back on 1 mg Amaryl at dinnertime  Likely has improved his diet and also trying to walk regularly  HYPERTENSION: Blood pressure is normal today    PLAN:   He will stay on same medication regimen for now He is more motivated and hopefully can maintain his  diet and alcohol reduction as well as regular walking program to keep his weight down Also to check readings after dinner and let us know if he has any consistently high readings  May be a candidate for Vascepa for better control of triglycerides and will defer to PCP    There are no Patient Instructions on file for this visit.   Elayne Snare 03/21/2021, 8:54 AM   Note: This office note was prepared with Dragon voice recognition system technology. Any transcriptional errors that result from this process are unintentional.  This visit occurred during the SARS-CoV-2 public health emergency.  Safety protocols were in place, including screening questions prior to the visit, additional usage of staff PPE, and extensive cleaning of exam room while observing appropriate contact time as  indicated for disinfecting solutions.

## 2021-03-21 NOTE — Patient Instructions (Signed)
Check blood sugars on waking up 3 days a week  Also check blood sugars about 2 hours after meals and do this after different meals by rotation  Recommended blood sugar levels on waking up are 90-130 and about 2 hours after meal is 130-160  Please bring your blood sugar monitor to each visit, thank you   

## 2021-03-27 ENCOUNTER — Other Ambulatory Visit: Payer: Self-pay | Admitting: Family Medicine

## 2021-03-27 ENCOUNTER — Other Ambulatory Visit: Payer: Self-pay | Admitting: Endocrinology

## 2021-03-27 DIAGNOSIS — I10 Essential (primary) hypertension: Secondary | ICD-10-CM

## 2021-03-27 DIAGNOSIS — E119 Type 2 diabetes mellitus without complications: Secondary | ICD-10-CM

## 2021-03-27 DIAGNOSIS — N529 Male erectile dysfunction, unspecified: Secondary | ICD-10-CM

## 2021-04-28 ENCOUNTER — Other Ambulatory Visit: Payer: Self-pay | Admitting: Family Medicine

## 2021-05-04 ENCOUNTER — Other Ambulatory Visit: Payer: Self-pay | Admitting: Family Medicine

## 2021-05-04 DIAGNOSIS — E119 Type 2 diabetes mellitus without complications: Secondary | ICD-10-CM

## 2021-05-04 DIAGNOSIS — I10 Essential (primary) hypertension: Secondary | ICD-10-CM

## 2021-05-28 ENCOUNTER — Other Ambulatory Visit: Payer: Self-pay | Admitting: Endocrinology

## 2021-05-29 ENCOUNTER — Ambulatory Visit: Payer: 59 | Admitting: Family Medicine

## 2021-05-29 ENCOUNTER — Encounter: Payer: Self-pay | Admitting: Family Medicine

## 2021-05-29 VITALS — BP 128/80 | HR 71 | Temp 97.3°F | Resp 16 | Wt 218.2 lb

## 2021-05-29 DIAGNOSIS — I10 Essential (primary) hypertension: Secondary | ICD-10-CM | POA: Diagnosis not present

## 2021-05-29 DIAGNOSIS — Z125 Encounter for screening for malignant neoplasm of prostate: Secondary | ICD-10-CM

## 2021-05-29 DIAGNOSIS — Z0000 Encounter for general adult medical examination without abnormal findings: Secondary | ICD-10-CM

## 2021-05-29 LAB — CBC WITH DIFFERENTIAL/PLATELET
Basophils Absolute: 0.1 10*3/uL (ref 0.0–0.1)
Basophils Relative: 1.1 % (ref 0.0–3.0)
Eosinophils Absolute: 0.3 10*3/uL (ref 0.0–0.7)
Eosinophils Relative: 3.2 % (ref 0.0–5.0)
HCT: 43.8 % (ref 39.0–52.0)
Hemoglobin: 15 g/dL (ref 13.0–17.0)
Lymphocytes Relative: 35.4 % (ref 12.0–46.0)
Lymphs Abs: 2.9 10*3/uL (ref 0.7–4.0)
MCHC: 34.3 g/dL (ref 30.0–36.0)
MCV: 91.4 fl (ref 78.0–100.0)
Monocytes Absolute: 0.6 10*3/uL (ref 0.1–1.0)
Monocytes Relative: 7.7 % (ref 3.0–12.0)
Neutro Abs: 4.4 10*3/uL (ref 1.4–7.7)
Neutrophils Relative %: 52.6 % (ref 43.0–77.0)
Platelets: 352 10*3/uL (ref 150.0–400.0)
RBC: 4.79 Mil/uL (ref 4.22–5.81)
RDW: 12.5 % (ref 11.5–15.5)
WBC: 8.3 10*3/uL (ref 4.0–10.5)

## 2021-05-29 LAB — LIPID PANEL
Cholesterol: 167 mg/dL (ref 0–200)
HDL: 52.4 mg/dL (ref >39.00)
LDL Cholesterol: 78 mg/dL (ref 0–99)
NonHDL: 114.46
Total CHOL/HDL Ratio: 3
Triglycerides: 181 mg/dL — ABNORMAL HIGH (ref 0.0–149.0)
VLDL: 36.2 mg/dL (ref 0.0–40.0)

## 2021-05-29 LAB — BASIC METABOLIC PANEL
BUN: 20 mg/dL (ref 6–23)
CO2: 27 mEq/L (ref 19–32)
Calcium: 10.3 mg/dL (ref 8.4–10.5)
Chloride: 97 mEq/L (ref 96–112)
Creatinine, Ser: 0.88 mg/dL (ref 0.40–1.50)
GFR: 92.91 mL/min (ref >60.00)
Glucose, Bld: 90 mg/dL (ref 70–99)
Potassium: 4.2 mEq/L (ref 3.5–5.1)
Sodium: 136 mEq/L (ref 135–145)

## 2021-05-29 LAB — PSA: PSA: 2.37 ng/mL (ref 0.10–4.00)

## 2021-05-29 LAB — HEPATIC FUNCTION PANEL
ALT: 24 U/L (ref 0–53)
AST: 22 U/L (ref 0–37)
Albumin: 4.7 g/dL (ref 3.5–5.2)
Alkaline Phosphatase: 46 U/L (ref 39–117)
Bilirubin, Direct: 0.1 mg/dL (ref 0.0–0.3)
Total Bilirubin: 0.5 mg/dL (ref 0.2–1.2)
Total Protein: 7.8 g/dL (ref 6.0–8.3)

## 2021-05-29 LAB — TSH: TSH: 0.69 u[IU]/mL (ref 0.35–5.50)

## 2021-05-29 NOTE — Patient Instructions (Addendum)
Follow up in 6 months to recheck BP and cholesterol We'll notify you of your lab results and make any changes if needed Continue to work on healthy diet and regular exercise- you can do it! Schedule your eye exam and have them send me a copy of the report Call with any questions or concerns Stay Safe!  Stay Healthy! Happy Holidays!

## 2021-05-29 NOTE — Progress Notes (Signed)
   Subjective:    Patient ID: Kevin Barnes, male    DOB: Mar 10, 1961, 60 y.o.   MRN: [id number]  HPI CPE- UTD on colonoscopy, Tdap.  Plans to wait on eye exam until first of the year.  Declines flu shot.  No concerns today.  Has increased walking in attempts to lose weight  Patient Care Team    Relationship Specialty Notifications Start End  Midge Minium, MD PCP - General Family Medicine  10/19/15   Rana Snare, MD (Inactive) Consulting Physician Urology  01/29/17   Elayne Snare, MD Consulting Physician Endocrinology  01/29/17   Dermatology, [facility] Physician   03/02/19     Health Maintenance  Topic Date Due   COVID-19 Vaccine (1) Never done   Pneumococcal Vaccine 17-17 Years old (1 - PCV) Never done   Zoster Vaccines- Shingrix (1 of 2) Never done   OPHTHALMOLOGY EXAM  05/29/2021 (Originally 08/23/2020)   Hepatitis C Screening  09/19/2021 (Originally 01/18/1978)   HIV Screening  09/19/2021 (Originally 01/19/1975)   INFLUENZA VACCINE  10/20/2021 (Originally 02/20/2021)   HEMOGLOBIN A1C  09/14/2021   FOOT EXAM  05/29/2022   COLONOSCOPY (Pts 45-63yrs Insurance coverage will need to be confirmed)  01/09/2024   TETANUS/TDAP  01/08/2026   HPV VACCINES  Aged Out      Review of Systems Patient reports no vision/hearing changes, anorexia, fever ,adenopathy, persistant/recurrent hoarseness, swallowing issues, chest pain, palpitations, edema, persistant/recurrent cough, hemoptysis, dyspnea (rest,exertional, paroxysmal nocturnal), gastrointestinal  bleeding (melena, rectal bleeding), abdominal pain, excessive heart burn, GU symptoms (dysuria, hematuria, voiding/incontinence issues) syncope, focal weakness, memory loss, numbness & tingling, skin/hair/nail changes, depression, anxiety, abnormal bruising/bleeding, musculoskeletal symptoms/signs.   This visit occurred during the SARS-CoV-2 public health emergency.  Safety protocols were in place, including screening questions prior to  the visit, additional usage of staff PPE, and extensive cleaning of exam room while observing appropriate contact time as indicated for disinfecting solutions.      Objective:   Physical Exam General Appearance:    Alert, cooperative, no distress, appears stated age, obese  Head:    Normocephalic, without obvious abnormality, atraumatic  Eyes:    PERRL, conjunctiva/corneas clear, EOM's intact, fundi    benign, both eyes       Ears:    Normal TM's and external ear canals, both ears  Nose:   Deferred due to COVID  Throat:   Neck:   Supple, symmetrical, trachea midline, no adenopathy;       thyroid:  No enlargement/tenderness/nodules  Back:     Symmetric, no curvature, ROM normal, no CVA tenderness  Lungs:     Clear to auscultation bilaterally, respirations unlabored  Chest wall:    No tenderness or deformity  Heart:    Regular rate and rhythm, S1 and S2 normal, no murmur, rub   or gallop  Abdomen:     Soft, non-tender, bowel sounds active all four quadrants,    no masses, no organomegaly  Genitalia:    Deferred  Rectal:    Extremities:   Extremities normal, atraumatic, no cyanosis or edema  Pulses:   2+ and symmetric all extremities  Skin:   Skin color, texture, turgor normal, no rashes or lesions  Lymph nodes:   Cervical, supraclavicular, and axillary nodes normal  Neurologic:   CNII-XII intact. Normal strength, sensation and reflexes      throughout          Assessment & Plan:

## 2021-05-29 NOTE — Assessment & Plan Note (Signed)
Chronic problem.  Well controlled today.  Currently asymptomatic.  Check labs.  No anticipated med changes.  Will follow.

## 2021-05-29 NOTE — Assessment & Plan Note (Signed)
Pt's PE WNL w/ exception of obesity.  UTD on colonoscopy, Tdap.  Will schedule eye exam.  Declines flu, COVID.  Check labs.  Anticipatory guidance provided.

## 2021-06-05 ENCOUNTER — Other Ambulatory Visit: Payer: Self-pay | Admitting: Endocrinology

## 2021-06-05 ENCOUNTER — Other Ambulatory Visit: Payer: Self-pay | Admitting: Family Medicine

## 2021-06-05 DIAGNOSIS — I10 Essential (primary) hypertension: Secondary | ICD-10-CM

## 2021-06-05 DIAGNOSIS — E119 Type 2 diabetes mellitus without complications: Secondary | ICD-10-CM

## 2021-06-27 ENCOUNTER — Other Ambulatory Visit (INDEPENDENT_AMBULATORY_CARE_PROVIDER_SITE_OTHER): Payer: 59

## 2021-06-27 ENCOUNTER — Other Ambulatory Visit: Payer: Self-pay

## 2021-06-27 DIAGNOSIS — E1165 Type 2 diabetes mellitus with hyperglycemia: Secondary | ICD-10-CM

## 2021-06-27 LAB — BASIC METABOLIC PANEL
BUN: 17 mg/dL (ref 6–23)
CO2: 28 mEq/L (ref 19–32)
Calcium: 9.9 mg/dL (ref 8.4–10.5)
Chloride: 98 mEq/L (ref 96–112)
Creatinine, Ser: 0.82 mg/dL (ref 0.40–1.50)
GFR: 94.86 mL/min (ref >60.00)
Glucose, Bld: 124 mg/dL — ABNORMAL HIGH (ref 70–99)
Potassium: 4.3 mEq/L (ref 3.5–5.1)
Sodium: 135 mEq/L (ref 135–145)

## 2021-06-27 LAB — MICROALBUMIN / CREATININE URINE RATIO
Creatinine,U: 94.2 mg/dL
Microalb Creat Ratio: 2 mg/g (ref 0.0–30.0)
Microalb, Ur: 1.9 mg/dL (ref 0.0–1.9)

## 2021-06-27 LAB — HEMOGLOBIN A1C: Hgb A1c MFr Bld: 6.9 % — ABNORMAL HIGH (ref 4.6–6.5)

## 2021-07-04 ENCOUNTER — Other Ambulatory Visit: Payer: Self-pay

## 2021-07-04 ENCOUNTER — Ambulatory Visit: Payer: 59 | Admitting: Endocrinology

## 2021-07-04 ENCOUNTER — Encounter: Payer: Self-pay | Admitting: Endocrinology

## 2021-07-04 VITALS — BP 136/80 | HR 71 | Ht 70.5 in | Wt 222.2 lb

## 2021-07-04 DIAGNOSIS — E1165 Type 2 diabetes mellitus with hyperglycemia: Secondary | ICD-10-CM

## 2021-07-04 NOTE — Progress Notes (Signed)
Patient ID: Kevin Barnes, male   DOB: April 24, 1961, 60 y.o.   MRN: [id number]            Reason for Appointment: Follow-up    History of Present Illness:          Date of diagnosis of type 2 diabetes mellitus:  2013      Background history:  He was diagnosed to have diabetes with a glucose of 132 in 2013 and baseline A1c of 6.4 He was started on metformin in 01/2013 when blood sugars were higher His blood sugars were reasonably controlled in the first 2 years or so, he was also given glipizide initially in 2015 but this caused hypoglycemia and he did not continue this. He had been followed up relatively sporadically He was also tried on Januvia subsequently in 2017 when his blood sugars were higher but this was changed to [place]  He did not take this because of the cost   A1c baseline in February 2018 was 9.2  Recent history:   His A1c is 6.8, previously 8.2  Non-insulin hypoglycemic drugs the patient is taking are:  Jardiance 25 mg daily in evening, Metformin 1g bid, Amaryl 1 mg at suppertime  Current management, blood sugar patterns and problems identified: He was tried on Rybelsus previously but blood sugars were higher with this and improved when he went back to Amaryl However he has overall lost weight Recently thinks he has gained back some weight and also not exercising in the last month and also not watching his diet consistently He is now checking his blood sugars more consistently at various times and only rarely has had a high reading Appears to have relatively high readings in the mornings, low readings in the afternoons and only in occasional high readings in the evenings on his blood sugar download He thinks his morning readings are better when he gets a full night sleep Previously was trying to walk on weekdays and also up to 4 miles on weekends, not doing much because of the weather and short days        Side effects from medications have : None, tries to take  metformin with food  Hypoglycemia:  he feels low sugars if the blood sugar is <90  Glucose monitoring:  done 1 times a day         Glucometer: One Touch Verio .       Blood Glucose readings    PRE-MEAL Fasting Lunch Dinner Bedtime Overall  Glucose range: 114-158    91-218  Mean/median: 138    136   POST-MEAL PC Breakfast PC Lunch PC Dinner  Glucose range:   118-218  Mean/median:  135    Prior  PRE-MEAL Fasting Lunch Dinner Bedtime Overall  Glucose range: 140-215    101-237  Mean/median:     162   POST-MEAL PC Breakfast PC Lunch PC Dinner  Glucose range:   237  Mean/median:        Self-care: The diet that the patient has been following is: tries to limit Drinks with sugar .     Meal times are:  Breakfast is at 7 am Lunch: Variable Dinner: 7 pm    Typical meal intake: Breakfast is small and may not always have a protein.  He will have  some times a day biscuit late morning His snacks will be peanut butter crackers or chips.                Dietician  visit, most recent: 11/19/16               Weight history:  Wt Readings from Last 3 Encounters:  07/04/21 222 lb 3.2 oz (100.8 kg)  05/29/21 218 lb 3.2 oz (99 kg)  03/21/21 214 lb (97.1 kg)    Glycemic control:   Lab Results  Component Value Date   HGBA1C 6.9 (H) 06/27/2021   HGBA1C 8.2 (H) 03/14/2021   HGBA1C 7.1 (H) 12/06/2020   Lab Results  Component Value Date   MICROALBUR 1.9 06/27/2021   LDLCALC 78 05/29/2021   CREATININE 0.82 06/27/2021   Lab Results  Component Value Date   MICRALBCREAT 2.0 06/27/2021    Lab Results  Component Value Date   FRUCTOSAMINE 295 (H) 11/19/2016      Allergies as of 07/04/2021   No Known Allergies      Medication List        Accurate as of July 04, 2021 10:08 AM. If you have any questions, ask your nurse or doctor.          atorvastatin 20 MG tablet Commonly known as: LIPITOR TAKE 1 TABLET BY MOUTH IN THE EVENING . APPOINTMENT REQUIRED FOR FUTURE  REFILLS   B-12 5000 MCG Subl Place under the tongue.   cholecalciferol 1000 units tablet Commonly known as: VITAMIN D Take 2,000 Units by mouth daily.   fenofibrate 160 MG tablet Take 1 tablet by mouth once daily   glimepiride 1 MG tablet Commonly known as: AMARYL TAKE 1 TABLET BY MOUTH ONCE DAILY BEFORE SUPPER   Jardiance 25 MG Tabs tablet Generic drug: empagliflozin Take 1 tablet by mouth once daily   lisinopril-hydrochlorothiazide 20-25 MG tablet Commonly known as: ZESTORETIC TAKE 1 TABLET BY MOUTH ONCE DAILY . APPOINTMENT REQUIRED FOR FUTURE REFILLS   metFORMIN 1000 MG tablet Commonly known as: GLUCOPHAGE TAKE 1 TABLET BY MOUTH TWICE DAILY WITH A MEAL   Omega-3 Krill Oil 1000 MG Caps Take by mouth.   OneTouch Verio test strip Generic drug: glucose blood USE 1 STRIP 1 TO CHECK SUGAR 1-2 TIMES DAILY   OVER THE COUNTER MEDICATION Sunny Mood and Curamin   SALMON OIL PO Take by mouth as directed.   sildenafil 50 MG tablet Commonly known as: VIAGRA TAKE 2 TABLETS BY MOUTH AS NEEDED FOR ERECTILE DYSFUNCTION        Allergies: No Known Allergies  Past Medical History:  Diagnosis Date   Allergy    Basal cell carcinoma of cheek 2016   Diabetes mellitus    Hyperlipidemia    Hypertension    Obesity    Obesity     Past Surgical History:  Procedure Laterality Date   PROSTATE SURGERY      Family History  Problem Relation Age of Onset   Cancer Father        Prostate   Heart disease Father    Hyperlipidemia Father    Hypertension Father    Heart failure Mother    Stroke Paternal Grandfather    Diabetes Neg Hx     Social History:  reports that he has quit smoking. He has never used smokeless tobacco. He reports current alcohol use of about 1.0 standard drink per week. He reports that he does not use drugs.   Review of Systems  Lipid history: He has had mostly high triglycerides  He is taking fenofibrate in addition to his Lipitor  This is  followed by PCP and cardiologist Recently triglycerides better  Labs as  follows   Lab Results  Component Value Date   CHOL 167 05/29/2021   HDL 52.40 05/29/2021   LDLCALC 78 05/29/2021   LDLDIRECT 87.0 09/19/2020   TRIG 181.0 (H) 05/29/2021   CHOLHDL 3 05/29/2021           Hypertension: Has had this for several years, taking lisinopril HCTZ from his PCP   BP Readings from Last 3 Encounters:  07/04/21 136/80  05/29/21 128/80  03/21/21 132/80   Hypercalcemia: His calcium was high on his initial consultation and also on another occasion Subsequent calcium levels have been upper normal  PTH not elevated in the past  He does take HCTZ 25 mg daily for hypertension  Lab Results  Component Value Date   CALCIUM 9.9 06/27/2021   Lab Results  Component Value Date   PTH 21 09/24/2017   CALCIUM 9.9 06/27/2021     Most recent eye exam was 2/21  Most recent foot exam:8/21  Had been refusing to take the Covid vaccine, has had clinical infection in 2020  LABS:  No visits with results within 1 Week(s) from this visit.  Latest known visit with results is:  Lab on 06/27/2021  Component Date Value Ref Range Status   Microalb, Ur 06/27/2021 1.9  0.0 - 1.9 mg/dL Final   Creatinine,U 06/27/2021 94.2  mg/dL Final   Microalb Creat Ratio 06/27/2021 2.0  0.0 - 30.0 mg/g Final   Sodium 06/27/2021 135  135 - 145 mEq/L Final   Potassium 06/27/2021 4.3  3.5 - 5.1 mEq/L Final   Chloride 06/27/2021 98  96 - 112 mEq/L Final   CO2 06/27/2021 28  19 - 32 mEq/L Final   Glucose, Bld 06/27/2021 124 (H)  70 - 99 mg/dL Final   BUN 06/27/2021 17  6 - 23 mg/dL Final   Creatinine, Ser 06/27/2021 0.82  0.40 - 1.50 mg/dL Final   GFR 06/27/2021 94.86  >60.00 mL/min Final   Calculated using the CKD-EPI Creatinine Equation (2021)   Calcium 06/27/2021 9.9  8.4 - 10.5 mg/dL Final   Hgb A1c MFr Bld 06/27/2021 6.9 (H)  4.6 - 6.5 % Final   Glycemic Control Guidelines for People with Diabetes:Non  Diabetic:  <6%Goal of Therapy: <7%Additional Action Suggested:  >8%     Physical Examination:  BP 136/80 (BP Location: Left Arm, Patient Position: Sitting, Cuff Size: Normal)   Pulse 71   Ht 5' 10.5" (1.791 m)   Wt 222 lb 3.2 oz (100.8 kg)   SpO2 97%   BMI 31.43 kg/m       ASSESSMENT:  Diabetes type 2 non-insulin-dependent  See history of present illness for detailed discussion of current diabetes management, blood sugar patterns and problems identified  His A1c is significantly better at 6.8  This has been as low as 6.6 in the past  He is taking Metformin 2 g daily, Jardiance 25 mg and Amaryl 1 mg  Although  he has gained back some of the weight he had lost previously his blood sugars overall are recently well controlled Better controlled is likely to be from his generally watching his diet better although has not been consistent in the last month either with diet or exercise Fasting readings are tending to be mildly increased although variable    PLAN:   He will need to start exercising He can try walking at the mall or other indoor locations Continue monitoring blood sugars at various time He will try to moderate on carbohydrates and high  fat meals more consistently Follow-up in 4 months   Patient Instructions  Check blood sugars on waking up 3-4 days a week  Also check blood sugars about 2 hours after meals and do this after different meals by rotation  Recommended blood sugar levels on waking up are 90-130 and about 2 hours after meal is 130-160  Please bring your blood sugar monitor to each visit, thank you  Walk daily   Elayne Snare 07/04/2021, 10:08 AM   Note: This office note was prepared with Dragon voice recognition system technology. Any transcriptional errors that result from this process are unintentional.  This visit occurred during the SARS-CoV-2 public health emergency.  Safety protocols were in place, including screening questions prior to the  visit, additional usage of staff PPE, and extensive cleaning of exam room while observing appropriate contact time as indicated for disinfecting solutions.

## 2021-07-04 NOTE — Patient Instructions (Signed)
Check blood sugars on waking up 3-4 days a week  Also check blood sugars about 2 hours after meals and do this after different meals by rotation  Recommended blood sugar levels on waking up are 90-130 and about 2 hours after meal is 130-160  Please bring your blood sugar monitor to each visit, thank you  Walk daily

## 2021-07-05 ENCOUNTER — Other Ambulatory Visit: Payer: Self-pay | Admitting: Family Medicine

## 2021-07-05 DIAGNOSIS — I10 Essential (primary) hypertension: Secondary | ICD-10-CM

## 2021-07-05 DIAGNOSIS — E119 Type 2 diabetes mellitus without complications: Secondary | ICD-10-CM

## 2021-07-10 ENCOUNTER — Other Ambulatory Visit: Payer: Self-pay | Admitting: Endocrinology

## 2021-07-10 ENCOUNTER — Other Ambulatory Visit: Payer: Self-pay | Admitting: Family Medicine

## 2021-07-10 DIAGNOSIS — E119 Type 2 diabetes mellitus without complications: Secondary | ICD-10-CM

## 2021-07-10 DIAGNOSIS — N529 Male erectile dysfunction, unspecified: Secondary | ICD-10-CM

## 2021-07-31 ENCOUNTER — Other Ambulatory Visit: Payer: Self-pay | Admitting: Family Medicine

## 2021-07-31 DIAGNOSIS — E119 Type 2 diabetes mellitus without complications: Secondary | ICD-10-CM

## 2021-07-31 DIAGNOSIS — N529 Male erectile dysfunction, unspecified: Secondary | ICD-10-CM

## 2021-07-31 DIAGNOSIS — I10 Essential (primary) hypertension: Secondary | ICD-10-CM

## 2021-08-01 MED ORDER — ATORVASTATIN CALCIUM 20 MG PO TABS: 20.0000 mg | ORAL_TABLET | Freq: Every day | ORAL | 1 refills | Status: DC

## 2021-08-01 MED ORDER — FENOFIBRATE 160 MG PO TABS: 160.0000 mg | ORAL_TABLET | Freq: Every day | ORAL | 1 refills | Status: DC

## 2021-08-01 MED ORDER — SILDENAFIL CITRATE 50 MG PO TABS: ORAL_TABLET | ORAL | 1 refills | Status: DC

## 2021-08-01 MED ORDER — LISINOPRIL-HYDROCHLOROTHIAZIDE 20-25 MG PO TABS: 1.0000 | ORAL_TABLET | Freq: Every day | ORAL | 1 refills | Status: DC

## 2021-08-12 ENCOUNTER — Other Ambulatory Visit: Payer: Self-pay | Admitting: Endocrinology

## 2021-08-25 ENCOUNTER — Other Ambulatory Visit: Payer: Self-pay | Admitting: Endocrinology

## 2021-09-05 ENCOUNTER — Other Ambulatory Visit: Payer: Self-pay | Admitting: Endocrinology

## 2021-10-09 ENCOUNTER — Other Ambulatory Visit: Payer: Self-pay | Admitting: Endocrinology

## 2021-10-09 DIAGNOSIS — E119 Type 2 diabetes mellitus without complications: Secondary | ICD-10-CM

## 2021-10-24 ENCOUNTER — Other Ambulatory Visit (INDEPENDENT_AMBULATORY_CARE_PROVIDER_SITE_OTHER): Payer: 59

## 2021-10-24 DIAGNOSIS — E1165 Type 2 diabetes mellitus with hyperglycemia: Secondary | ICD-10-CM

## 2021-10-24 LAB — BASIC METABOLIC PANEL
BUN: 20 mg/dL (ref 6–23)
CO2: 29 mEq/L (ref 19–32)
Calcium: 10 mg/dL (ref 8.4–10.5)
Chloride: 97 mEq/L (ref 96–112)
Creatinine, Ser: 0.98 mg/dL (ref 0.40–1.50)
GFR: 83.08 mL/min (ref >60.00)
Glucose, Bld: 131 mg/dL — ABNORMAL HIGH (ref 70–99)
Potassium: 4.6 mEq/L (ref 3.5–5.1)
Sodium: 135 mEq/L (ref 135–145)

## 2021-10-24 LAB — HEMOGLOBIN A1C: Hgb A1c MFr Bld: 6.8 % — ABNORMAL HIGH (ref 4.6–6.5)

## 2021-10-25 ENCOUNTER — Other Ambulatory Visit: Payer: 59

## 2021-10-30 LAB — HM DIABETES EYE EXAM

## 2021-10-31 ENCOUNTER — Encounter: Payer: Self-pay | Admitting: Endocrinology

## 2021-10-31 ENCOUNTER — Ambulatory Visit: Payer: 59 | Admitting: Endocrinology

## 2021-10-31 VITALS — BP 124/78 | HR 77 | Ht 70.5 in | Wt 222.0 lb

## 2021-10-31 DIAGNOSIS — E1165 Type 2 diabetes mellitus with hyperglycemia: Secondary | ICD-10-CM

## 2021-10-31 DIAGNOSIS — I10 Essential (primary) hypertension: Secondary | ICD-10-CM

## 2021-10-31 NOTE — Progress Notes (Signed)
Patient ID: Kevin Barnes, male   DOB: Feb 28, 1961, 61 y.o.   MRN: [id number]  ? ?       ? ? ?Reason for Appointment: Follow-up  ? ? ?History of Present Illness:  ?        ?Date of diagnosis of type 2 diabetes mellitus:  2013     ? ?Background history:  ?He was diagnosed to have diabetes with a glucose of 132 in 2013 and baseline A1c of 6.4 ?He was started on metformin in 01/2013 when blood sugars were higher ?His blood sugars were reasonably controlled in the first 2 years or so, he was also given glipizide initially in 2015 but this caused hypoglycemia and he did not continue this. ?He had been followed up relatively sporadically ?He was also tried on Januvia subsequently in 2017 when his blood sugars were higher but this was changed to [place]  ?He did not take this because of the cost ?  A1c baseline in February 2018 was 9.2 ? ?Recent history:  ? ?His A1c is 6.8, previously as high as 8.2 ? ?Non-insulin hypoglycemic drugs the patient is taking are:  Jardiance 25 mg daily in evening, Metformin 1g bid, Amaryl 1 mg at suppertime ? ?Current management, blood sugar patterns and problems identified: ?He was advised to check his blood sugars consistently after meals but he tends to forget to do that and also goes to bed a couple hours after dinner  ?He has not done any consistent walking and only when he is traveling or doing some activities ?Also had not been very motivated earlier this year because of reactive depression  ?His home fasting readings have been relatively higher averaging about 138 compared to readings as low as 106 midday ?He thinks he is mostly watching his diet except when eating out ?However has been able to maintain his weight unchanged, previously had gained some ? ?      ?Side effects from medications have : None, tries to take metformin with food ? ?Hypoglycemia:  he feels low sugars if the blood sugar is <90 ? ?Glucose monitoring:  done 1 times a day         Glucometer: One Touch Verio .       ? ?Blood Glucose readings  ? ? ?PRE-MEAL Fasting Lunch Dinner Bedtime Overall  ?Glucose range: 120-163    106-163  ?Mean/median: 138 115   129  ? ?POST-MEAL PC Breakfast PC Lunch PC Dinner  ?Glucose range:   ?  ?Mean/median:     ? ?Previously: ? ?PRE-MEAL Fasting Lunch Dinner Bedtime Overall  ?Glucose range: 114-158    91-218  ?Mean/median: 138    136  ? ?POST-MEAL PC Breakfast PC Lunch PC Dinner  ?Glucose range:   118-218  ?Mean/median:  135   ? ? ? ?Self-care: The diet that the patient has been following is: tries to limit Drinks with sugar .     ?Meal times are:  Breakfast is at 7 am Lunch: Variable Dinner: 7 pm  ?  ?Typical meal intake: Breakfast is small and may not always have a protein.  He will have  some times a day biscuit late morning ?His snacks will be peanut butter crackers or chips. ?               ?Dietician visit, most recent: 11/19/16 ?             ? ?Weight history: ? ?Wt Readings from Last 3 Encounters:  ?10/31/21  222 lb (100.7 kg)  ?07/04/21 222 lb 3.2 oz (100.8 kg)  ?05/29/21 218 lb 3.2 oz (99 kg)  ? ? ?Glycemic control: ?  ?Lab Results  ?Component Value Date  ? HGBA1C 6.8 (H) 10/24/2021  ? HGBA1C 6.9 (H) 06/27/2021  ? HGBA1C 8.2 (H) 03/14/2021  ? ?Lab Results  ?Component Value Date  ? MICROALBUR 1.9 06/27/2021  ? [place] 78 05/29/2021  ? CREATININE 0.98 10/24/2021  ? ?Lab Results  ?Component Value Date  ? MICRALBCREAT 2.0 06/27/2021  ? ? ?Lab Results  ?Component Value Date  ? FRUCTOSAMINE 295 (H) 11/19/2016  ? ? ? ? ?Allergies as of 10/31/2021   ?No Known Allergies ?  ? ?  ?Medication List  ?  ? ?  ? Accurate as of October 31, 2021  4:06 PM. If you have any questions, ask your nurse or doctor.  ?  ?  ? ?  ? ?atorvastatin 20 MG tablet ?Commonly known as: LIPITOR ?Take 1 tablet (20 mg total) by mouth daily. ?  ?B-12 5000 MCG Subl ?Place under the tongue. ?  ?cholecalciferol 1000 units tablet ?Commonly known as: VITAMIN D ?Take 2,000 Units by mouth daily. ?  ?fenofibrate 160 MG tablet ?Take 1 tablet  (160 mg total) by mouth daily. ?  ?glimepiride 1 MG tablet ?Commonly known as: AMARYL ?TAKE 1 TABLET BY MOUTH ONCE DAILY BEFORE SUPPER ?  ?Jardiance 25 MG Tabs tablet ?Generic drug: empagliflozin ?Take 1 tablet by mouth once daily ?  ?lisinopril-hydrochlorothiazide 20-25 MG tablet ?Commonly known as: ZESTORETIC ?Take 1 tablet by mouth daily. ?  ?metFORMIN 1000 MG tablet ?Commonly known as: GLUCOPHAGE ?TAKE 1 TABLET BY MOUTH TWICE DAILY WITH A MEAL ?  ?Omega-3 Krill Oil 1000 MG Caps ?Take by mouth. ?  ?OneTouch Verio test strip ?Generic drug: glucose blood ?USE 1 STRIP TO CHECK GLUCOSE 1-2 TIMES DAILY ?  ?OVER THE COUNTER MEDICATION ?Sunny Mood and Curamin ?  ?SALMON OIL PO ?Take by mouth as directed. ?  ?sildenafil 50 MG tablet ?Commonly known as: VIAGRA ?TAKE 2 TABLETS BY MOUTH AS NEEDED FOR ERECTILE DYSFUNCTION ?  ? ?  ? ? ?Allergies: No Known Allergies ? ?Past Medical History:  ?Diagnosis Date  ? Allergy   ? Basal cell carcinoma of cheek 2016  ? Diabetes mellitus   ? Hyperlipidemia   ? Hypertension   ? Obesity   ? Obesity   ? ? ?Past Surgical History:  ?Procedure Laterality Date  ? PROSTATE SURGERY    ? ? ?Family History  ?Problem Relation Age of Onset  ? Cancer Father   ?     Prostate  ? Heart disease Father   ? Hyperlipidemia Father   ? Hypertension Father   ? Heart failure Mother   ? Stroke Paternal Grandfather   ? Diabetes Neg Hx   ? ? ?Social History:  reports that he has quit smoking. He has never used smokeless tobacco. He reports current alcohol use of about 1.0 standard drink per week. He reports that he does not use drugs. ? ? ?Review of Systems ? ?Lipid history: He has had mostly high triglycerides  ?He is taking fenofibrate in addition to his Lipitor ? ?This is followed by PCP and cardiologist ?Last triglycerides better ? ?Labs as follows ?  ?Lab Results  ?Component Value Date  ? CHOL 167 05/29/2021  ? HDL 52.40 05/29/2021  ? [place] 78 05/29/2021  ? LDLDIRECT 87.0 09/19/2020  ? TRIG 181.0 (H)  05/29/2021  ? CHOLHDL 3  05/29/2021  ?     ?    ?Hypertension: Has had this for several years, taking lisinopril HCTZ from his PCP ? ? ?BP Readings from Last 3 Encounters:  ?10/31/21 124/78  ?07/04/21 136/80  ?05/29/21 128/80  ? ?Hypercalcemia: His calcium was high on his initial consultation and also on another occasion ?Subsequent calcium levels have been upper normal ? ?PTH not elevated in the past ? ? ?Lab Results  ?Component Value Date  ? CALCIUM 10.0 10/24/2021  ? ?Lab Results  ?Component Value Date  ? PTH 21 09/24/2017  ? CALCIUM 10.0 10/24/2021  ? ? ? ?Most recent eye exam was 4/23, report not available ? ?Most recent foot exam: 4/23 ? ? ?LABS: ? ?No visits with results within 1 Week(s) from this visit.  ?Latest known visit with results is:  ?Lab on 10/24/2021  ?Component Date Value Ref Range Status  ? Sodium 10/24/2021 135  135 - 145 mEq/L Final  ? Potassium 10/24/2021 4.6  3.5 - 5.1 mEq/L Final  ? Chloride 10/24/2021 97  96 - 112 mEq/L Final  ? CO2 10/24/2021 29  19 - 32 mEq/L Final  ? Glucose, Bld 10/24/2021 131 (H)  70 - 99 mg/dL Final  ? BUN 10/24/2021 20  6 - 23 mg/dL Final  ? Creatinine, Ser 10/24/2021 0.98  0.40 - 1.50 mg/dL Final  ? GFR 10/24/2021 83.08  >60.00 mL/min Final  ? Calculated using the CKD-EPI Creatinine Equation (2021)  ? Calcium 10/24/2021 10.0  8.4 - 10.5 mg/dL Final  ? Hgb A1c MFr Bld 10/24/2021 6.8 (H)  4.6 - 6.5 % Final  ? Glycemic Control Guidelines for People with Diabetes:Non Diabetic:  <6%Goal of Therapy: <7%Additional Action Suggested:  >8%   ? ? ?Physical Examination: ? ?BP 124/78   Pulse 77   Ht 5' 10.5" (1.791 m)   Wt 222 lb (100.7 kg)   BMI 31.40 kg/m?  ? ?Diabetic Foot Exam - Simple   ?Simple Foot Form ?Diabetic Foot exam was performed with the following findings: Yes   ?Visual Inspection ?No deformities, no ulcerations, no other skin breakdown bilaterally: Yes ?Sensation Testing ?Intact to touch and monofilament testing bilaterally: Yes ?Pulse Check ?Posterior  Tibialis and Dorsalis pulse intact bilaterally: Yes ?Comments ?  ? ? ?   ?ASSESSMENT: ? ?Diabetes type 2 non-insulin-dependent ? ?See history of present illness for detailed discussion of current diabetes management, bloo

## 2021-10-31 NOTE — Patient Instructions (Signed)
Check blood sugars on waking up days a week  Also check blood sugars about 2 hours after meals and do this after different meals by rotation  Recommended blood sugar levels on waking up are 90-130 and about 2 hours after meal is 130-160  Please bring your blood sugar monitor to each visit, thank you   

## 2021-11-01 ENCOUNTER — Ambulatory Visit: Payer: 59 | Admitting: Endocrinology

## 2021-11-21 ENCOUNTER — Encounter: Payer: Self-pay | Admitting: Family Medicine

## 2021-11-22 ENCOUNTER — Other Ambulatory Visit: Payer: Self-pay | Admitting: Endocrinology

## 2021-11-29 ENCOUNTER — Encounter: Payer: Self-pay | Admitting: Family Medicine

## 2021-12-04 ENCOUNTER — Ambulatory Visit: Payer: 59 | Admitting: Family Medicine

## 2021-12-04 ENCOUNTER — Encounter: Payer: Self-pay | Admitting: Family Medicine

## 2021-12-04 VITALS — BP 130/78 | HR 70 | Temp 98.0°F | Resp 16 | Ht 70.5 in | Wt 224.8 lb

## 2021-12-04 DIAGNOSIS — I10 Essential (primary) hypertension: Secondary | ICD-10-CM

## 2021-12-04 DIAGNOSIS — E1169 Type 2 diabetes mellitus with other specified complication: Secondary | ICD-10-CM | POA: Diagnosis not present

## 2021-12-04 DIAGNOSIS — M79645 Pain in left finger(s): Secondary | ICD-10-CM

## 2021-12-04 DIAGNOSIS — E785 Hyperlipidemia, unspecified: Secondary | ICD-10-CM | POA: Diagnosis not present

## 2021-12-04 DIAGNOSIS — E669 Obesity, unspecified: Secondary | ICD-10-CM

## 2021-12-04 DIAGNOSIS — M79644 Pain in right finger(s): Secondary | ICD-10-CM | POA: Diagnosis not present

## 2021-12-04 LAB — CBC WITH DIFFERENTIAL/PLATELET
Basophils Absolute: 0 10*3/uL (ref 0.0–0.1)
Basophils Relative: 0.2 % (ref 0.0–3.0)
Eosinophils Absolute: 0.3 10*3/uL (ref 0.0–0.7)
Eosinophils Relative: 4.3 % (ref 0.0–5.0)
HCT: 43 % (ref 39.0–52.0)
Hemoglobin: 14.7 g/dL (ref 13.0–17.0)
Lymphocytes Relative: 40.5 % (ref 12.0–46.0)
Lymphs Abs: 2.5 10*3/uL (ref 0.7–4.0)
MCHC: 34.2 g/dL (ref 30.0–36.0)
MCV: 92.1 fl (ref 78.0–100.0)
Monocytes Absolute: 0.7 10*3/uL (ref 0.1–1.0)
Monocytes Relative: 10.9 % (ref 3.0–12.0)
Neutro Abs: 2.7 10*3/uL (ref 1.4–7.7)
Neutrophils Relative %: 44.1 % (ref 43.0–77.0)
Platelets: 348 10*3/uL (ref 150.0–400.0)
RBC: 4.67 Mil/uL (ref 4.22–5.81)
RDW: 12.5 % (ref 11.5–15.5)
WBC: 6.1 10*3/uL (ref 4.0–10.5)

## 2021-12-04 LAB — TSH: TSH: 0.69 u[IU]/mL (ref 0.35–5.50)

## 2021-12-04 NOTE — Assessment & Plan Note (Signed)
Ongoing issue for pt.  BMI stable at 31.8  Encouraged healthy diet and regular exercise.  Will follow. ?

## 2021-12-04 NOTE — Assessment & Plan Note (Signed)
Chronic problem.  On Lipitor '20mg'$  daily and Fenofibrate '160mg'$  daily w/o difficulty.  Check labs.  Adjust meds prn  ?

## 2021-12-04 NOTE — Progress Notes (Signed)
? ?  Subjective:  ? ? Patient ID: Kevin Barnes, male    DOB: March 22, 1961, 61 y.o.   MRN: [id number] ? ?HPI ?HTN- chronic problem, on Lisinopril HCTZ 20/'25mg'$  daily w/ adequate control.  'i feel good'.  No CP, SOB, HAs, visual changes, edema. ? ?Hyperlipidemia- chronic problem, on Lipitor '20mg'$  daily and Fenofibrate '160mg'$  daily.  No abd pain, N/V. ? ?Obesity- ongoing issue for pt.  BMI 31.8.  pt is trying to walk more and do it more regularly.  Pt is following a low carb diet due to DM ? ?Bilateral thumb pain- occurring at [facility] joint and radiating up into thumbs.  Has been going on 'for years' but recently worsening ? ? ?Review of Systems ?For ROS see HPI  ?   ?Objective:  ? Physical Exam ?Vitals reviewed.  ?Constitutional:   ?   General: He is not in acute distress. ?   Appearance: Normal appearance. He is well-developed. He is not ill-appearing.  ?HENT:  ?   Head: Normocephalic and atraumatic.  ?Eyes:  ?   Extraocular Movements: Extraocular movements intact.  ?   Conjunctiva/sclera: Conjunctivae normal.  ?   Pupils: Pupils are equal, round, and reactive to light.  ?Neck:  ?   Thyroid: No thyromegaly.  ?Cardiovascular:  ?   Rate and Rhythm: Normal rate and regular rhythm.  ?   Pulses: Normal pulses.  ?   Heart sounds: Normal heart sounds. No murmur heard. ?Pulmonary:  ?   Effort: Pulmonary effort is normal. No respiratory distress.  ?   Breath sounds: Normal breath sounds.  ?Abdominal:  ?   General: Bowel sounds are normal. There is no distension.  ?   Palpations: Abdomen is soft.  ?Musculoskeletal:     ?   General: Tenderness (mild TTP over CMC joints bilaterally) present. No swelling or deformity.  ?   Cervical back: Normal range of motion and neck supple.  ?   Right lower leg: No edema.  ?   Left lower leg: No edema.  ?Lymphadenopathy:  ?   Cervical: No cervical adenopathy.  ?Skin: ?   General: Skin is warm and dry.  ?Neurological:  ?   General: No focal deficit present.  ?   Mental Status: He is alert and oriented  to person, place, and time.  ?   Cranial Nerves: No cranial nerve deficit.  ?Psychiatric:     ?   Mood and Affect: Mood normal.     ?   Behavior: Behavior normal.  ? ? ? ? ? ?   ?Assessment & Plan:  ? ?Bilateral thumb pain- new.  Pain is most intense at [facility] joints and then radiates through thumb.  Start Tylenol prn.  Refer to hand specialist for evaluation and tx.  Pt expressed understanding and is in agreement w/ plan.  ?

## 2021-12-04 NOTE — Assessment & Plan Note (Signed)
Chronic problem.  Well controlled on Lisinopril HCTZ.  Currently asymptomatic.  Check labs due to ACE and diuretic use but no anticipated med changes.  Will follow ?

## 2021-12-04 NOTE — Patient Instructions (Addendum)
Schedule your complete physical in 6 months ?We'll notify you of your lab results and make any changes if needed ?Continue to work on healthy diet and regular exercise- you can do it! ?We'll call you with your hand referral ?Tylenol as needed for pain relief in the hands ?Call with any questions or concerns ?Have a great summer!!! ? ?

## 2021-12-05 ENCOUNTER — Telehealth: Payer: Self-pay

## 2021-12-05 ENCOUNTER — Other Ambulatory Visit: Payer: Self-pay | Admitting: Endocrinology

## 2021-12-05 LAB — BASIC METABOLIC PANEL
BUN: 15 mg/dL (ref 6–23)
CO2: 25 mEq/L (ref 19–32)
Calcium: 9.9 mg/dL (ref 8.4–10.5)
Chloride: 98 mEq/L (ref 96–112)
Creatinine, Ser: 0.79 mg/dL (ref 0.40–1.50)
GFR: 95.63 mL/min (ref >60.00)
Glucose, Bld: 121 mg/dL — ABNORMAL HIGH (ref 70–99)
Potassium: 4.5 mEq/L (ref 3.5–5.1)
Sodium: 135 mEq/L (ref 135–145)

## 2021-12-05 LAB — HEPATIC FUNCTION PANEL
ALT: 39 U/L (ref 0–53)
AST: 26 U/L (ref 0–37)
Albumin: 4.5 g/dL (ref 3.5–5.2)
Alkaline Phosphatase: 44 U/L (ref 39–117)
Bilirubin, Direct: 0.1 mg/dL (ref 0.0–0.3)
Total Bilirubin: 0.5 mg/dL (ref 0.2–1.2)
Total Protein: 7.4 g/dL (ref 6.0–8.3)

## 2021-12-05 LAB — LIPID PANEL
Cholesterol: 145 mg/dL (ref 0–200)
HDL: 47.9 mg/dL (ref >39.00)
LDL Cholesterol: 58 mg/dL (ref 0–99)
NonHDL: 96.78
Total CHOL/HDL Ratio: 3
Triglycerides: 192 mg/dL — ABNORMAL HIGH (ref 0.0–149.0)
VLDL: 38.4 mg/dL (ref 0.0–40.0)

## 2021-12-05 NOTE — Telephone Encounter (Signed)
-----   Message from Katherine E Tabori, MD sent at 12/05/2021  7:18 AM EDT ----- ?Labs look great!  No changes at this time ?

## 2021-12-05 NOTE — Telephone Encounter (Signed)
Patient was given message. Verbalized understanding.  ?

## 2021-12-25 ENCOUNTER — Ambulatory Visit: Payer: Self-pay

## 2021-12-25 ENCOUNTER — Ambulatory Visit: Payer: 59 | Admitting: Orthopedic Surgery

## 2021-12-25 ENCOUNTER — Ambulatory Visit (INDEPENDENT_AMBULATORY_CARE_PROVIDER_SITE_OTHER): Payer: 59

## 2021-12-25 ENCOUNTER — Encounter: Payer: Self-pay | Admitting: Orthopedic Surgery

## 2021-12-25 VITALS — BP 128/80 | HR 66 | Ht 70.5 in | Wt 225.0 lb

## 2021-12-25 DIAGNOSIS — M79642 Pain in left hand: Secondary | ICD-10-CM | POA: Diagnosis not present

## 2021-12-25 DIAGNOSIS — M79641 Pain in right hand: Secondary | ICD-10-CM

## 2021-12-25 DIAGNOSIS — M180 Bilateral primary osteoarthritis of first carpometacarpal joints: Secondary | ICD-10-CM | POA: Diagnosis not present

## 2021-12-25 MED ORDER — MELOXICAM 7.5 MG PO TABS: 7.5000 mg | ORAL_TABLET | Freq: Every day | ORAL | 0 refills | Status: DC

## 2021-12-25 NOTE — Progress Notes (Signed)
Office Visit Note   Patient: Kevin Barnes           Date of Birth: Sep 20, 1960           MRN: [id number] Visit Date: 12/25/2021              Requested by: Midge Minium, MD [address] [place],  [place] [address] PCP: Midge Minium, MD   Assessment & Plan: Visit Diagnoses:  1. Pain in both hands   2. 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  We reviewed the nature of arthritis as well as the diagnosis, prognosis, and both conservative and surgical treatment options.  After our discussion, he would like to try an oral anti-inflammatory medication.  He is not interested in immobilization or corticosteroid injection today.  A prescription will be sent to his pharmacy.  He can follow-up again with me as needed.  Follow-Up Instructions: No follow-ups on file.   Orders:  Orders Placed This Encounter  Procedures   XR Hand Complete Left   XR Hand Complete Right   No orders of the defined types were placed in this encounter.     Procedures: No procedures performed   Clinical Data: No additional findings.   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  The pain is localized to the [facility] joints.  This been going on for years now has been progressively worsening.  He has good days and bad days.  He notes he has intermittent swelling of the thumbs.  His pain is as bad as 6/10 at worst.  This pain is worse with activities that involve pinching  or grasping.  The example he gives include twisting water bottles or opening jars.  His only treatment so far has been daily turmeric pills which he thinks provides some relief.  He denies pain elsewhere in the hand.   Review of Systems   Objective: Vital Signs: BP 128/80 (BP Location: Left Arm, Patient Position: Sitting)   Pulse 66   Ht 5' 10.5" (1.791 m)   Wt 225 lb (102.1 kg)   BMI 31.83 kg/m   Physical Exam Constitutional:      Appearance: Normal appearance.  Cardiovascular:     Rate and Rhythm: Normal rate.     Pulses: Normal pulses.  Pulmonary:     Effort: Pulmonary effort is normal.  Skin:    General: Skin is warm and dry.     Capillary Refill: Capillary refill takes less than 2 seconds.  Neurological:     Mental Status: He is alert.    Right Hand Exam   Tenderness  Right hand tenderness location: TTP at thumb CMC joint.  Other  Erythema: absent Sensation: normal Pulse: present  Comments:  Positive CMC grind test w/ crepitus and pain.  No static or dynamic MCP hyper-extension.  No pain w/ ROM or palpation at MCP joint.  No palmar abduction contracture of thumb.  No triggering.  Negative Finkelstein test.    Left Hand Exam   Tenderness  Left hand tenderness location: TTP at thumb CMC joint.   Other  Erythema: absent Sensation: normal Pulse: present  Comments:  Positive CMC grind test w/ crepitus and pain.  No static or dynamic MCP hyper-extension.  No pain w/ ROM or palpation at MCP joint.  No palmar abduction contracture of thumb.  No triggering.  Negative Finkelstein test.       Specialty Comments:  No specialty comments available.  Imaging: XR Hand Complete Left  Result Date: 12/25/2021 Multiple views of the left hand taken today were reviewed interpreted by me.  They demonstrate Eaton stage III osteoarthritis of the thumb CMC joint with significant joint space narrowing, subchondral sclerosis, and large osteophyte formation.  The STT joint  appears to be preserved on these views.  There is no evidence of significant first MCP osteoarthritis.  XR Hand Complete Right  Result Date: 12/25/2021 Multiple views of the right hand taken today were reviewed interpreted by me.  They demonstrate Eaton stage III osteoarthritis of the thumb CMC joint with significant joint space narrowing, subchondral sclerosis, and large osteophyte formation.  The STT joint appears to be preserved on these views.  There is no evidence of significant first MCP osteoarthritis.    PMFS History: Patient Active Problem List   Diagnosis Date Noted   Osteoarthritis of carpometacarpal [facility]) joint of both thumbs 12/25/2021   History of COVID-19 08/31/2019   Obesity (BMI 30-39.9) 08/25/2018   Physical exam 01/29/2017   HTN (hypertension) 10/09/2011   Hyperlipidemia associated with type 2 diabetes mellitus ([place]) 10/09/2011   Diabetes mellitus ([place]) 08/22/2011   Past Medical History:  Diagnosis Date   Allergy    Basal cell carcinoma of cheek 2016   Diabetes mellitus    Hyperlipidemia    Hypertension    Obesity    Obesity     Family History  Problem Relation Age of Onset   Cancer Father        Prostate   Heart disease Father    Hyperlipidemia Father    Hypertension Father    Heart failure Mother    Stroke Paternal Grandfather    Diabetes Neg Hx     Past Surgical History:  Procedure Laterality Date   PROSTATE SURGERY     Social History   Occupational History   Occupation: Solicitor: [employer]  Tobacco Use   Smoking status: Former   Smokeless tobacco: Never  Scientific laboratory technician Use: Never used  Substance and Sexual Activity   Alcohol use: Yes    Alcohol/week: 1.0 standard drink    Types: 1 Standard drinks or equivalent per week    Comment: 1 drink   Drug use: No   Sexual activity: Yes    Birth control/protection: None

## 2021-12-30 ENCOUNTER — Other Ambulatory Visit: Payer: Self-pay | Admitting: Endocrinology

## 2021-12-30 DIAGNOSIS — E119 Type 2 diabetes mellitus without complications: Secondary | ICD-10-CM

## 2021-12-31 ENCOUNTER — Other Ambulatory Visit: Payer: Self-pay | Admitting: Endocrinology

## 2022-01-21 ENCOUNTER — Other Ambulatory Visit: Payer: Self-pay | Admitting: Orthopedic Surgery

## 2022-02-01 ENCOUNTER — Other Ambulatory Visit: Payer: Self-pay | Admitting: Family Medicine

## 2022-02-01 DIAGNOSIS — I10 Essential (primary) hypertension: Secondary | ICD-10-CM

## 2022-02-01 DIAGNOSIS — E119 Type 2 diabetes mellitus without complications: Secondary | ICD-10-CM

## 2022-02-17 ENCOUNTER — Other Ambulatory Visit: Payer: Self-pay | Admitting: Endocrinology

## 2022-02-22 ENCOUNTER — Other Ambulatory Visit: Payer: Self-pay | Admitting: Orthopedic Surgery

## 2022-02-26 ENCOUNTER — Other Ambulatory Visit: Payer: Self-pay | Admitting: Endocrinology

## 2022-03-13 ENCOUNTER — Other Ambulatory Visit: Payer: 59

## 2022-03-19 ENCOUNTER — Other Ambulatory Visit (INDEPENDENT_AMBULATORY_CARE_PROVIDER_SITE_OTHER): Payer: 59

## 2022-03-19 DIAGNOSIS — E1165 Type 2 diabetes mellitus with hyperglycemia: Secondary | ICD-10-CM

## 2022-03-19 LAB — BASIC METABOLIC PANEL
BUN: 17 mg/dL (ref 6–23)
CO2: 27 mEq/L (ref 19–32)
Calcium: 10.1 mg/dL (ref 8.4–10.5)
Chloride: 98 mEq/L (ref 96–112)
Creatinine, Ser: 0.93 mg/dL (ref 0.40–1.50)
GFR: 88.22 mL/min (ref >60.00)
Glucose, Bld: 146 mg/dL — ABNORMAL HIGH (ref 70–99)
Potassium: 3.8 mEq/L (ref 3.5–5.1)
Sodium: 132 mEq/L — ABNORMAL LOW (ref 135–145)

## 2022-03-19 LAB — HEMOGLOBIN A1C: Hgb A1c MFr Bld: 7.1 % — ABNORMAL HIGH (ref 4.6–6.5)

## 2022-03-20 ENCOUNTER — Ambulatory Visit: Payer: 59 | Admitting: Endocrinology

## 2022-03-20 ENCOUNTER — Encounter: Payer: Self-pay | Admitting: Endocrinology

## 2022-03-20 VITALS — BP 138/70 | HR 67 | Ht 71.5 in | Wt 223.4 lb

## 2022-03-20 DIAGNOSIS — I10 Essential (primary) hypertension: Secondary | ICD-10-CM

## 2022-03-20 DIAGNOSIS — E871 Hypo-osmolality and hyponatremia: Secondary | ICD-10-CM

## 2022-03-20 DIAGNOSIS — E1165 Type 2 diabetes mellitus with hyperglycemia: Secondary | ICD-10-CM | POA: Diagnosis not present

## 2022-03-20 MED ORDER — TIRZEPATIDE 5 MG/0.5ML ~~LOC~~ SOAJ: 5.0000 mg | SUBCUTANEOUS | 2 refills | Status: DC

## 2022-03-20 NOTE — Progress Notes (Signed)
Patient ID: Kevin Barnes, male   DOB: [birth year], 61 y.o.   MRN: [id number]            Reason for Appointment: Follow-up    History of Present Illness:          Date of diagnosis of type 2 diabetes mellitus:  2013      Background history:  He was diagnosed to have diabetes with a glucose of 132 in 2013 and baseline A1c of 6.4 He was started on metformin in 01/2013 when blood sugars were higher His blood sugars were reasonably controlled in the first 2 years or so, he was also given glipizide initially in 2015 but this caused hypoglycemia and he did not continue this. He had been followed up relatively sporadically He was also tried on Januvia subsequently in 2017 when his blood sugars were higher but this was changed to [place]  He did not take this because of the cost   A1c baseline in February 2018 was 9.2  Recent history:   His A1c is 7.1 and higher, previously as high as 8.2  Non-insulin hypoglycemic drugs the patient is taking are:  Jardiance 25 mg daily in evening, Metformin 1g bid, Amaryl 1 mg at suppertime  Current management, blood sugar patterns and problems identified: He did not bring his monitor  He notices that his blood sugars are around 190 and occasionally is 200 after dinner  Also morning sugars are about 140, although it is 146 in the lab This is despite taking Amaryl at dinnertime  Apparently he had higher blood sugars when he was given a trial of Rybelsus  Weight is about the same as in April when he was seen He is doing a little bit of walking or other activities but not much formal exercise Previously advised to check his blood sugars consistently after all of the meals by rotation but not clear how often he is doing this Usually watching his diet except when eating out Taking his medications regularly as far as Jardiance and metformin       Side effects from medications have : None, tries to take metformin with food  Hypoglycemia:  he feels low sugars  if the blood sugar is <90  Glucose monitoring:  done 1 times a day         Glucometer: One Touch Verio .       Blood Glucose readings as above  Previously  PRE-MEAL Fasting Lunch Dinner Bedtime Overall  Glucose range: 120-163    106-163  Mean/median: 138 115   129   POST-MEAL PC Breakfast PC Lunch PC Dinner  Glucose range:   ?  Mean/median:         Self-care: The diet that the patient has been following is: tries to limit Drinks with sugar .     Meal times are:  Breakfast is at 7 am Lunch: Variable Dinner: 7 pm    Typical meal intake: Breakfast is small and may not always have a protein.  He will have  some times a day biscuit late morning His snacks will be peanut butter crackers or chips.                Dietician visit, most recent: 11/19/16               Weight history:  Wt Readings from Last 3 Encounters:  03/20/22 223 lb 6.4 oz (101.3 kg)  12/25/21 225 lb (102.1 kg)  12/04/21 224 lb 12.8  oz (102 kg)    Glycemic control:   Lab Results  Component Value Date   HGBA1C 7.1 (H) 03/19/2022   HGBA1C 6.8 (H) 10/24/2021   HGBA1C 6.9 (H) 06/27/2021   Lab Results  Component Value Date   MICROALBUR 1.9 06/27/2021   LDLCALC 58 12/04/2021   CREATININE 0.93 03/19/2022   Lab Results  Component Value Date   MICRALBCREAT 2.0 06/27/2021    Lab Results  Component Value Date   FRUCTOSAMINE 295 (H) 11/19/2016      Allergies as of 03/20/2022   No Known Allergies      Medication List        Accurate as of March 20, 2022  8:34 AM. If you have any questions, ask your nurse or doctor.          atorvastatin 20 MG tablet Commonly known as: LIPITOR Take 1 tablet by mouth once daily   B-12 5000 MCG Subl Place under the tongue.   cholecalciferol 1000 units tablet Commonly known as: VITAMIN D Take 2,000 Units by mouth daily.   fenofibrate 160 MG tablet Take 1 tablet by mouth once daily   glimepiride 1 MG tablet Commonly known as: AMARYL TAKE 1 TABLET BY  MOUTH ONCE DAILY BEFORE SUPPER   Jardiance 25 MG Tabs tablet Generic drug: empagliflozin Take 1 tablet by mouth once daily   lisinopril-hydrochlorothiazide 20-25 MG tablet Commonly known as: ZESTORETIC Take 1 tablet by mouth once daily   meloxicam 7.5 MG tablet Commonly known as: MOBIC Take 1 tablet by mouth once daily   metFORMIN 1000 MG tablet Commonly known as: GLUCOPHAGE TAKE 1 TABLET BY MOUTH TWICE DAILY WITH A MEAL   Omega-3 Krill Oil 1000 MG Caps Take by mouth.   OneTouch Verio test strip Generic drug: glucose blood USE 1 STRIP TO CHECK GLUCOSE 1 TO 2 TIMES DAILY   OVER THE COUNTER MEDICATION Sunny Mood and Curamin   SALMON OIL PO Take by mouth as directed.   sildenafil 50 MG tablet Commonly known as: VIAGRA TAKE 2 TABLETS BY MOUTH AS NEEDED FOR ERECTILE DYSFUNCTION        Allergies: No Known Allergies  Past Medical History:  Diagnosis Date   Allergy    Basal cell carcinoma of cheek 2016   Diabetes mellitus    Hyperlipidemia    Hypertension    Obesity    Obesity     Past Surgical History:  Procedure Laterality Date   PROSTATE SURGERY      Family History  Problem Relation Age of Onset   Cancer Father        Prostate   Heart disease Father    Hyperlipidemia Father    Hypertension Father    Heart failure Mother    Stroke Paternal Grandfather    Diabetes Neg Hx     Social History:  reports that he has quit smoking. He has never used smokeless tobacco. He reports current alcohol use of about 1.0 standard drink of alcohol per week. He reports that he does not use drugs.   Review of Systems  Lipid history: He has had mostly high triglycerides  He is taking fenofibrate in addition to his Lipitor  This is followed by PCP and cardiologist Last triglycerides better  Labs as follows   Lab Results  Component Value Date   CHOL 145 12/04/2021   HDL 47.90 12/04/2021   LDLCALC 58 12/04/2021   LDLDIRECT 87.0 09/19/2020   TRIG 192.0 (H)  12/04/2021   CHOLHDL  3 12/04/2021           Hypertension: Has had this for several years, taking lisinopril HCTZ from his PCP   BP Readings from Last 3 Encounters:  03/20/22 138/70  12/25/21 128/80  12/04/21 130/78   Hypercalcemia: His calcium was high on his initial consultation and also on another occasion Subsequent calcium levels have been upper normal  PTH not elevated in the past   Lab Results  Component Value Date   CALCIUM 10.1 03/19/2022   Lab Results  Component Value Date   PTH 21 09/24/2017   CALCIUM 10.1 03/19/2022     Most recent eye exam was 4/23,  Most recent foot exam: 4/23   LABS:  Lab on 03/19/2022  Component Date Value Ref Range Status   Sodium 03/19/2022 132 (L)  135 - 145 mEq/L Final   Potassium 03/19/2022 3.8  3.5 - 5.1 mEq/L Final   Chloride 03/19/2022 98  96 - 112 mEq/L Final   CO2 03/19/2022 27  19 - 32 mEq/L Final   Glucose, Bld 03/19/2022 146 (H)  70 - 99 mg/dL Final   BUN 03/19/2022 17  6 - 23 mg/dL Final   Creatinine, Ser 03/19/2022 0.93  0.40 - 1.50 mg/dL Final   GFR 03/19/2022 88.22  >60.00 mL/min Final   Calculated using the CKD-EPI Creatinine Equation (2021)   Calcium 03/19/2022 10.1  8.4 - 10.5 mg/dL Final   Hgb A1c MFr Bld 03/19/2022 7.1 (H)  4.6 - 6.5 % Final   Glycemic Control Guidelines for People with Diabetes:Non Diabetic:  <6%Goal of Therapy: <7%Additional Action Suggested:  >8%     Physical Examination:  BP 138/70   Pulse 67   Ht 5' 11.5" (1.816 m)   Wt 223 lb 6.4 oz (101.3 kg)   SpO2 97%   BMI 30.72 kg/m       ASSESSMENT:  Diabetes type 2 non-insulin-dependent  See history of present illness for detailed discussion of current diabetes management, blood sugar patterns and problems identified  His A1c is now 7.1 and higher  This has been as low as 6.6 in the past  He is taking Metformin 2 g daily, Jardiance 25 mg and Amaryl 1 mg  His A1c is relatively higher Likely has high readings after dinner and  overnight Also he is concerned about potential for low blood sugars during the daytime since he gets symptomatic with blood sugars below 90   Hypertension: Under control  Mild hyponatremia: Asymptomatic and likely from HCTZ   PLAN:    Trial of Mounjaro 2.5 mg with a sample  Discussed with the patient the action of GIP/GLP-1 drugs, the effects on pancreatic and liver function, effects on brain and stomach with improved satiety, slowing gastric emptying, improving satiety and reducing liver glucose output.  Discussed the effects on promoting weight loss. Explained possible side effects of MOUNJARO, most commonly nausea that usually improves over time; discussed safety information in package insert.  Demonstrated the medication injection device and injection technique to the patient.  Showed patient the injection sites for his medication To start with 2.5 mg dosage weekly for the first 4 injections and then increase the dose to 5 mg weekly  Patient brochure on Mounjaro and explained use of the available co-pay card   Discussed that if he has low normal readings he can stop Amaryl Encouraged him to start walking regularly Cut back on portions and snacks especially to avoid nausea with Mounjaro  Will forward results of sodium  to PCP and consider reducing HCTZ  Follow-up in 4 months   There are no Patient Instructions on file for this visit.    Elayne Snare 03/20/2022, 8:34 AM   Note: This office note was prepared with Dragon voice recognition system technology. Any transcriptional errors that result from this process are unintentional.  This visit occurred during the SARS-CoV-2 public health emergency.  Safety protocols were in place, including screening questions prior to the visit, additional usage of staff PPE, and extensive cleaning of exam room while observing appropriate contact time as indicated for disinfecting solutions.

## 2022-03-20 NOTE — Patient Instructions (Addendum)
Check blood sugars on waking up 3 days a week  Also check blood sugars about 2 hours after meals and do this after different meals by rotation  Recommended blood sugar levels on waking up are 90-130 and about 2 hours after meal is 130-160  Please bring your blood sugar monitor to each visit, thank you  Walk daily  Stop Glimepride if sugar <90

## 2022-03-27 ENCOUNTER — Other Ambulatory Visit: Payer: Self-pay | Admitting: Endocrinology

## 2022-03-27 DIAGNOSIS — E119 Type 2 diabetes mellitus without complications: Secondary | ICD-10-CM

## 2022-05-07 ENCOUNTER — Other Ambulatory Visit: Payer: Self-pay | Admitting: Family Medicine

## 2022-05-07 DIAGNOSIS — E119 Type 2 diabetes mellitus without complications: Secondary | ICD-10-CM

## 2022-05-07 DIAGNOSIS — I10 Essential (primary) hypertension: Secondary | ICD-10-CM

## 2022-05-17 ENCOUNTER — Other Ambulatory Visit: Payer: Self-pay | Admitting: Endocrinology

## 2022-05-24 ENCOUNTER — Encounter: Payer: Self-pay | Admitting: Endocrinology

## 2022-05-24 DIAGNOSIS — E1165 Type 2 diabetes mellitus with hyperglycemia: Secondary | ICD-10-CM

## 2022-05-28 MED ORDER — TIRZEPATIDE 2.5 MG/0.5ML ~~LOC~~ SOAJ: 2.5000 mg | SUBCUTANEOUS | 1 refills | Status: DC

## 2022-06-05 ENCOUNTER — Encounter: Payer: Self-pay | Admitting: Family Medicine

## 2022-06-05 ENCOUNTER — Ambulatory Visit (INDEPENDENT_AMBULATORY_CARE_PROVIDER_SITE_OTHER): Payer: 59 | Admitting: Family Medicine

## 2022-06-05 ENCOUNTER — Encounter: Payer: 59 | Admitting: Family Medicine

## 2022-06-05 VITALS — BP 128/66 | HR 72 | Temp 97.6°F | Resp 17 | Ht 71.5 in | Wt 215.5 lb

## 2022-06-05 DIAGNOSIS — Z0000 Encounter for general adult medical examination without abnormal findings: Secondary | ICD-10-CM

## 2022-06-05 DIAGNOSIS — Z125 Encounter for screening for malignant neoplasm of prostate: Secondary | ICD-10-CM

## 2022-06-05 DIAGNOSIS — E119 Type 2 diabetes mellitus without complications: Secondary | ICD-10-CM

## 2022-06-05 LAB — BASIC METABOLIC PANEL
BUN: 17 mg/dL (ref 6–23)
CO2: 31 mEq/L (ref 19–32)
Calcium: 10.1 mg/dL (ref 8.4–10.5)
Chloride: 97 mEq/L (ref 96–112)
Creatinine, Ser: 0.8 mg/dL (ref 0.40–1.50)
GFR: 94.94 mL/min (ref >60.00)
Glucose, Bld: 88 mg/dL (ref 70–99)
Potassium: 4.4 mEq/L (ref 3.5–5.1)
Sodium: 134 mEq/L — ABNORMAL LOW (ref 135–145)

## 2022-06-05 LAB — CBC WITH DIFFERENTIAL/PLATELET
Basophils Absolute: 0.1 10*3/uL (ref 0.0–0.1)
Basophils Relative: 0.7 % (ref 0.0–3.0)
Eosinophils Absolute: 0.2 10*3/uL (ref 0.0–0.7)
Eosinophils Relative: 2.9 % (ref 0.0–5.0)
HCT: 44.2 % (ref 39.0–52.0)
Hemoglobin: 15.1 g/dL (ref 13.0–17.0)
Lymphocytes Relative: 28.2 % (ref 12.0–46.0)
Lymphs Abs: 2.2 10*3/uL (ref 0.7–4.0)
MCHC: 34.3 g/dL (ref 30.0–36.0)
MCV: 92.6 fl (ref 78.0–100.0)
Monocytes Absolute: 0.7 10*3/uL (ref 0.1–1.0)
Monocytes Relative: 9.3 % (ref 3.0–12.0)
Neutro Abs: 4.7 10*3/uL (ref 1.4–7.7)
Neutrophils Relative %: 58.9 % (ref 43.0–77.0)
Platelets: 371 10*3/uL (ref 150.0–400.0)
RBC: 4.77 Mil/uL (ref 4.22–5.81)
RDW: 12.6 % (ref 11.5–15.5)
WBC: 7.9 10*3/uL (ref 4.0–10.5)

## 2022-06-05 LAB — PSA: PSA: 2.6 ng/mL (ref 0.10–4.00)

## 2022-06-05 LAB — LIPID PANEL
Cholesterol: 134 mg/dL (ref 0–200)
HDL: 49.2 mg/dL (ref >39.00)
LDL Cholesterol: 53 mg/dL (ref 0–99)
NonHDL: 84.54
Total CHOL/HDL Ratio: 3
Triglycerides: 160 mg/dL — ABNORMAL HIGH (ref 0.0–149.0)
VLDL: 32 mg/dL (ref 0.0–40.0)

## 2022-06-05 LAB — HEPATIC FUNCTION PANEL
ALT: 30 U/L (ref 0–53)
AST: 23 U/L (ref 0–37)
Albumin: 4.6 g/dL (ref 3.5–5.2)
Alkaline Phosphatase: 40 U/L (ref 39–117)
Bilirubin, Direct: 0.1 mg/dL (ref 0.0–0.3)
Total Bilirubin: 0.5 mg/dL (ref 0.2–1.2)
Total Protein: 7.4 g/dL (ref 6.0–8.3)

## 2022-06-05 LAB — MICROALBUMIN / CREATININE URINE RATIO
Creatinine,U: 102.5 mg/dL
Microalb Creat Ratio: 1.7 mg/g (ref 0.0–30.0)
Microalb, Ur: 1.8 mg/dL (ref 0.0–1.9)

## 2022-06-05 LAB — TSH: TSH: 0.73 u[IU]/mL (ref 0.35–5.50)

## 2022-06-05 LAB — HEMOGLOBIN A1C: Hgb A1c MFr Bld: 6.2 % (ref 4.6–6.5)

## 2022-06-05 NOTE — Progress Notes (Signed)
   Subjective:    Patient ID: Kevin Barnes, male    DOB: 01-24-1961, 61 y.o.   MRN: [id number]  HPI CPE- UTD on eye exam, foot exam, colonoscopy, Tdap.  Due for urine microalbumin  Patient Care Team    Relationship Specialty Notifications Start End  Midge Minium, MD PCP - General Family Medicine  10/19/15   Rana Snare, MD (Inactive) Consulting Physician Urology  01/29/17   Elayne Snare, MD Consulting Physician Endocrinology  01/29/17   Dermatology, [facility] Physician   03/02/19     Health Maintenance  Topic Date Due   Diabetic kidney evaluation - Urine ACR  06/27/2022   Zoster Vaccines- Shingrix (1 of 2) 09/05/2022 (Originally 01/18/2010)   INFLUENZA VACCINE  10/21/2022 (Originally 02/20/2022)   HEMOGLOBIN A1C  09/19/2022   OPHTHALMOLOGY EXAM  10/31/2022   FOOT EXAM  11/01/2022   Diabetic kidney evaluation - GFR measurement  03/20/2023   COLONOSCOPY (Pts 45-35yr Insurance coverage will need to be confirmed)  01/09/2024   TETANUS/TDAP  01/08/2026   HPV VACCINES  Aged Out   COVID-19 Vaccine  Discontinued   Hepatitis C Screening  Discontinued   HIV Screening  Discontinued      Review of Systems Patient reports no vision/hearing changes, anorexia, fever ,adenopathy, persistant/recurrent hoarseness, swallowing issues, chest pain, palpitations, edema, persistant/recurrent cough, hemoptysis, dyspnea (rest,exertional, paroxysmal nocturnal), gastrointestinal  bleeding (melena, rectal bleeding), abdominal pain, excessive heart burn, GU symptoms (dysuria, hematuria, voiding/incontinence issues) syncope, focal weakness, memory loss, numbness & tingling, skin/hair/nail changes, depression, anxiety, abnormal bruising/bleeding, musculoskeletal symptoms/signs.   + 8 lb loss    Objective:   Physical Exam General Appearance:    Alert, cooperative, no distress, appears stated age  Head:    Normocephalic, without obvious abnormality, atraumatic  Eyes:    PERRL,  conjunctiva/corneas clear, EOM's intact both eyes       Ears:    Normal TM's and external ear canals, both ears  Nose:   Nares normal, septum midline, mucosa normal, no drainage   or sinus tenderness  Throat:   Lips, mucosa, and tongue normal; teeth and gums normal  Neck:   Supple, symmetrical, trachea midline, no adenopathy;       thyroid:  No enlargement/tenderness/nodules  Back:     Symmetric, no curvature, ROM normal, no CVA tenderness  Lungs:     Clear to auscultation bilaterally, respirations unlabored  Chest wall:    No tenderness or deformity  Heart:    Regular rate and rhythm, S1 and S2 normal, no murmur, rub   or gallop  Abdomen:     Soft, non-tender, bowel sounds active all four quadrants,    no masses, no organomegaly  Genitalia:    deferred  Rectal:    Extremities:   Extremities normal, atraumatic, no cyanosis or edema  Pulses:   2+ and symmetric all extremities  Skin:   Skin color, texture, turgor normal, no rashes or lesions  Lymph nodes:   Cervical, supraclavicular, and axillary nodes normal  Neurologic:   CNII-XII intact. Normal strength, sensation and reflexes      throughout          Assessment & Plan:

## 2022-06-05 NOTE — Assessment & Plan Note (Signed)
Chronic problem.  Following w/ Dr Dwyane Dee.  Due for microalbumin and A1C.  Check labs and forward to Endo for review

## 2022-06-05 NOTE — Patient Instructions (Signed)
Follow up in 6 months to recheck BP and cholesterol We'll notify you of your lab results and make any changes if needed Continue to work on healthy diet and regular exercise- you're doing great! Call with any questions or concerns Stay Safe!  Stay Healthy! Happy Holidays!!

## 2022-06-05 NOTE — Assessment & Plan Note (Signed)
Pt's PE WNL.  UTD on eye exam, foot exam, Tdap, colonoscopy.  Due for microalbumin- ordered.  Check labs.  Anticipatory guidance provided.

## 2022-06-06 ENCOUNTER — Telehealth: Payer: Self-pay

## 2022-06-06 NOTE — Telephone Encounter (Signed)
Pt seen results via my chart  

## 2022-06-06 NOTE — Telephone Encounter (Signed)
-----   Message from Midge Minium, MD sent at 06/06/2022  3:53 PM EST ----- Labs look great!  No changes at this time

## 2022-06-07 ENCOUNTER — Other Ambulatory Visit: Payer: Self-pay | Admitting: Endocrinology

## 2022-06-10 ENCOUNTER — Other Ambulatory Visit: Payer: Self-pay | Admitting: Endocrinology

## 2022-07-10 ENCOUNTER — Other Ambulatory Visit (INDEPENDENT_AMBULATORY_CARE_PROVIDER_SITE_OTHER): Payer: 59

## 2022-07-10 DIAGNOSIS — E1165 Type 2 diabetes mellitus with hyperglycemia: Secondary | ICD-10-CM | POA: Diagnosis not present

## 2022-07-10 LAB — BASIC METABOLIC PANEL
BUN: 19 mg/dL (ref 6–23)
CO2: 29 mEq/L (ref 19–32)
Calcium: 10.4 mg/dL (ref 8.4–10.5)
Chloride: 98 mEq/L (ref 96–112)
Creatinine, Ser: 0.96 mg/dL (ref 0.40–1.50)
GFR: 84.74 mL/min (ref >60.00)
Glucose, Bld: 119 mg/dL — ABNORMAL HIGH (ref 70–99)
Potassium: 4.8 mEq/L (ref 3.5–5.1)
Sodium: 135 mEq/L (ref 135–145)

## 2022-07-10 LAB — MICROALBUMIN / CREATININE URINE RATIO
Creatinine,U: 99.9 mg/dL
Microalb Creat Ratio: 1.7 mg/g (ref 0.0–30.0)
Microalb, Ur: 1.7 mg/dL (ref 0.0–1.9)

## 2022-07-10 LAB — HEMOGLOBIN A1C: Hgb A1c MFr Bld: 6.2 % (ref 4.6–6.5)

## 2022-07-15 ENCOUNTER — Other Ambulatory Visit: Payer: Self-pay | Admitting: Endocrinology

## 2022-07-15 DIAGNOSIS — E1165 Type 2 diabetes mellitus with hyperglycemia: Secondary | ICD-10-CM

## 2022-07-17 ENCOUNTER — Other Ambulatory Visit: Payer: 59

## 2022-07-23 NOTE — Progress Notes (Unsigned)
Patient ID: Kevin Barnes, male   DOB: [birth year], 62 y.o.   MRN: [id number]            Reason for Appointment: Follow-up    History of Present Illness:          Date of diagnosis of type 2 diabetes mellitus:  2013      Background history:  He was diagnosed to have diabetes with a glucose of 132 in 2013 and baseline A1c of 6.4 He was started on metformin in 01/2013 when blood sugars were higher His blood sugars were reasonably controlled in the first 2 years or so, he was also given glipizide initially in 2015 but this caused hypoglycemia and he did not continue this. He had been followed up relatively sporadically He was also tried on Januvia subsequently in 2017 when his blood sugars were higher but this was changed to [place]  He did not take this because of the cost   A1c baseline in February 2018 was 9.2  Recent history:   His A1c is 7.1 and higher, previously as high as 8.2  Non-insulin hypoglycemic drugs the patient is taking are:  Jardiance 25 mg daily in evening, Metformin 1g bid, Amaryl 1 mg at suppertime  Current management, blood sugar patterns and problems identified: He did not bring his monitor  He notices that his blood sugars are around 190 and occasionally is 200 after dinner  Also morning sugars are about 140, although it is 146 in the lab This is despite taking Amaryl at dinnertime  Apparently he had higher blood sugars when he was given a trial of Rybelsus  Weight is about the same as in April when he was seen He is doing a little bit of walking or other activities but not much formal exercise Previously advised to check his blood sugars consistently after all of the meals by rotation but not clear how often he is doing this Usually watching his diet except when eating out Taking his medications regularly as far as Jardiance and metformin       Side effects from medications have : None, tries to take metformin with food  Hypoglycemia:  he feels low sugars  if the blood sugar is <90  Glucose monitoring:  done 1 times a day         Glucometer: One Touch Verio .       Blood Glucose readings as above  Previously  PRE-MEAL Fasting Lunch Dinner Bedtime Overall  Glucose range: 120-163    106-163  Mean/median: 138 115   129   POST-MEAL PC Breakfast PC Lunch PC Dinner  Glucose range:   ?  Mean/median:         Self-care: The diet that the patient has been following is: tries to limit Drinks with sugar .     Meal times are:  Breakfast is at 7 am Lunch: Variable Dinner: 7 pm    Typical meal intake: Breakfast is small and may not always have a protein.  He will have  some times a day biscuit late morning His snacks will be peanut butter crackers or chips.                Dietician visit, most recent: 11/19/16               Weight history:  Wt Readings from Last 3 Encounters:  06/05/22 215 lb 8 oz (97.8 kg)  03/20/22 223 lb 6.4 oz (101.3 kg)  12/25/21 225  lb (102.1 kg)    Glycemic control:   Lab Results  Component Value Date   HGBA1C 6.2 07/10/2022   HGBA1C 6.2 06/05/2022   HGBA1C 7.1 (H) 03/19/2022   Lab Results  Component Value Date   MICROALBUR 1.7 07/10/2022   LDLCALC 53 06/05/2022   CREATININE 0.96 07/10/2022   Lab Results  Component Value Date   MICRALBCREAT 1.7 07/10/2022    Lab Results  Component Value Date   FRUCTOSAMINE 295 (H) 11/19/2016      Allergies as of 07/24/2022   No Known Allergies      Medication List        Accurate as of July 23, 2022  4:10 PM. If you have any questions, ask your nurse or doctor.          atorvastatin 20 MG tablet Commonly known as: LIPITOR Take 1 tablet by mouth once daily   B-12 5000 MCG Subl Place under the tongue.   cholecalciferol 1000 units tablet Commonly known as: VITAMIN D Take 2,000 Units by mouth daily.   fenofibrate 160 MG tablet Take 1 tablet by mouth once daily   glimepiride 1 MG tablet Commonly known as: AMARYL TAKE 1 TABLET BY MOUTH ONCE  DAILY BEFORE SUPPER   Jardiance 25 MG Tabs tablet Generic drug: empagliflozin Take 1 tablet by mouth once daily   lisinopril-hydrochlorothiazide 20-25 MG tablet Commonly known as: ZESTORETIC Take 1 tablet by mouth once daily   meloxicam 7.5 MG tablet Commonly known as: MOBIC Take 1 tablet by mouth once daily   metFORMIN 1000 MG tablet Commonly known as: GLUCOPHAGE TAKE 1 TABLET BY MOUTH TWICE DAILY WITH A MEAL   Mounjaro 2.5 MG/0.5ML Pen Generic drug: tirzepatide INJECT 1/2 (ONE-HALF) ML  ONCE A WEEK   Omega-3 Krill Oil 1000 MG Caps Take by mouth.   OneTouch Verio test strip Generic drug: glucose blood USE 1 STRIP TO CHECK GLUCOSE 1 TO 2 TIMES DAILY   OVER THE COUNTER MEDICATION Sunny Mood and Curamin   SALMON OIL PO Take by mouth as directed.   sildenafil 50 MG tablet Commonly known as: VIAGRA TAKE 2 TABLETS BY MOUTH AS NEEDED FOR ERECTILE DYSFUNCTION        Allergies: No Known Allergies  Past Medical History:  Diagnosis Date   Allergy    Basal cell carcinoma of cheek 2016   Diabetes mellitus    Hyperlipidemia    Hypertension    Obesity    Obesity     Past Surgical History:  Procedure Laterality Date   PROSTATE SURGERY      Family History  Problem Relation Age of Onset   Cancer Father        Prostate   Heart disease Father    Hyperlipidemia Father    Hypertension Father    Heart failure Mother    Stroke Paternal Grandfather    Diabetes Neg Hx     Social History:  reports that he has quit smoking. He has never used smokeless tobacco. He reports current alcohol use of about 1.0 standard drink of alcohol per week. He reports that he does not use drugs.   Review of Systems  Lipid history: He has had mostly high triglycerides  He is taking fenofibrate in addition to his Lipitor  This is followed by PCP and cardiologist Last triglycerides better  Labs as follows   Lab Results  Component Value Date   CHOL 134 06/05/2022   HDL 49.20  06/05/2022   LDLCALC 53  06/05/2022   LDLDIRECT 87.0 09/19/2020   TRIG 160.0 (H) 06/05/2022   CHOLHDL 3 06/05/2022           Hypertension: Has had this for several years, taking lisinopril HCTZ from his PCP   BP Readings from Last 3 Encounters:  06/05/22 128/66  03/20/22 138/70  12/25/21 128/80   Hypercalcemia: His calcium was high on his initial consultation and also on another occasion Subsequent calcium levels have been upper normal  PTH not elevated in the past   Lab Results  Component Value Date   CALCIUM 10.4 07/10/2022   Lab Results  Component Value Date   PTH 21 09/24/2017   CALCIUM 10.4 07/10/2022     Most recent eye exam was 4/23,  Most recent foot exam: 4/23   LABS:  No visits with results within 1 Week(s) from this visit.  Latest known visit with results is:  Lab on 07/10/2022  Component Date Value Ref Range Status   Microalb, Ur 07/10/2022 1.7  0.0 - 1.9 mg/dL Final   Creatinine,U 07/10/2022 99.9  mg/dL Final   Microalb Creat Ratio 07/10/2022 1.7  0.0 - 30.0 mg/g Final   Sodium 07/10/2022 135  135 - 145 mEq/L Final   Potassium 07/10/2022 4.8  3.5 - 5.1 mEq/L Final   Chloride 07/10/2022 98  96 - 112 mEq/L Final   CO2 07/10/2022 29  19 - 32 mEq/L Final   Glucose, Bld 07/10/2022 119 (H)  70 - 99 mg/dL Final   BUN 07/10/2022 19  6 - 23 mg/dL Final   Creatinine, Ser 07/10/2022 0.96  0.40 - 1.50 mg/dL Final   GFR 07/10/2022 84.74  >60.00 mL/min Final   Calculated using the CKD-EPI Creatinine Equation (2021)   Calcium 07/10/2022 10.4  8.4 - 10.5 mg/dL Final   Hgb A1c MFr Bld 07/10/2022 6.2  4.6 - 6.5 % Final   Glycemic Control Guidelines for People with Diabetes:Non Diabetic:  <6%Goal of Therapy: <7%Additional Action Suggested:  >8%     Physical Examination:  There were no vitals taken for this visit.      ASSESSMENT:  Diabetes type 2 non-insulin-dependent  See history of present illness for detailed discussion of current diabetes management,  blood sugar patterns and problems identified  His A1c is now 7.1 and higher  This has been as low as 6.6 in the past  He is taking Metformin 2 g daily, Jardiance 25 mg and Amaryl 1 mg  His A1c is relatively higher Likely has high readings after dinner and overnight Also he is concerned about potential for low blood sugars during the daytime since he gets symptomatic with blood sugars below 90   Hypertension: Under control  Mild hyponatremia: Asymptomatic and likely from HCTZ   PLAN:    Trial of Mounjaro 2.5 mg with a sample  Discussed with the patient the action of GIP/GLP-1 drugs, the effects on pancreatic and liver function, effects on brain and stomach with improved satiety, slowing gastric emptying, improving satiety and reducing liver glucose output.  Discussed the effects on promoting weight loss. Explained possible side effects of MOUNJARO, most commonly nausea that usually improves over time; discussed safety information in package insert.  Demonstrated the medication injection device and injection technique to the patient.  Showed patient the injection sites for his medication To start with 2.5 mg dosage weekly for the first 4 injections and then increase the dose to 5 mg weekly  Patient brochure on Mounjaro and explained use of the available  co-pay card   Discussed that if he has low normal readings he can stop Amaryl Encouraged him to start walking regularly Cut back on portions and snacks especially to avoid nausea with Mounjaro  Will forward results of sodium to PCP and consider reducing HCTZ  Follow-up in 4 months   There are no Patient Instructions on file for this visit.    Elayne Snare 07/23/2022, 4:10 PM   Note: This office note was prepared with Dragon voice recognition system technology. Any transcriptional errors that result from this process are unintentional.  This visit occurred during the SARS-CoV-2 public health emergency.  Safety protocols were in  place, including screening questions prior to the visit, additional usage of staff PPE, and extensive cleaning of exam room while observing appropriate contact time as indicated for disinfecting solutions.

## 2022-07-24 ENCOUNTER — Encounter: Payer: Self-pay | Admitting: Endocrinology

## 2022-07-24 ENCOUNTER — Ambulatory Visit: Payer: 59 | Admitting: Endocrinology

## 2022-07-24 VITALS — BP 126/88 | HR 82 | Ht 71.5 in | Wt 215.6 lb

## 2022-07-24 DIAGNOSIS — I10 Essential (primary) hypertension: Secondary | ICD-10-CM | POA: Diagnosis not present

## 2022-07-24 DIAGNOSIS — E1165 Type 2 diabetes mellitus with hyperglycemia: Secondary | ICD-10-CM

## 2022-07-24 NOTE — Patient Instructions (Signed)
Check blood sugars on waking up 2-3 days a week  Also check blood sugars about 2 hours after meals and do this after different meals by rotation  Recommended blood sugar levels on waking up are 90-130 and about 2 hours after meal is 130-160  Please bring your blood sugar monitor to each visit, thank you  Glimeperide 1/2 tab  Restart exercise

## 2022-08-05 ENCOUNTER — Other Ambulatory Visit: Payer: Self-pay | Admitting: Family Medicine

## 2022-08-05 DIAGNOSIS — E119 Type 2 diabetes mellitus without complications: Secondary | ICD-10-CM

## 2022-08-05 DIAGNOSIS — I10 Essential (primary) hypertension: Secondary | ICD-10-CM

## 2022-08-12 ENCOUNTER — Other Ambulatory Visit: Payer: Self-pay | Admitting: Endocrinology

## 2022-08-12 DIAGNOSIS — E1165 Type 2 diabetes mellitus with hyperglycemia: Secondary | ICD-10-CM

## 2022-08-20 ENCOUNTER — Other Ambulatory Visit: Payer: Self-pay | Admitting: Endocrinology

## 2022-08-20 MED ORDER — EMPAGLIFLOZIN 25 MG PO TABS: 25.0000 mg | ORAL_TABLET | Freq: Every day | ORAL | 2 refills | Status: DC

## 2022-09-01 ENCOUNTER — Other Ambulatory Visit: Payer: Self-pay | Admitting: Endocrinology

## 2022-10-06 ENCOUNTER — Other Ambulatory Visit: Payer: Self-pay | Admitting: Endocrinology

## 2022-10-06 DIAGNOSIS — E1165 Type 2 diabetes mellitus with hyperglycemia: Secondary | ICD-10-CM

## 2022-11-01 ENCOUNTER — Other Ambulatory Visit: Payer: Self-pay | Admitting: Family Medicine

## 2022-11-01 DIAGNOSIS — E119 Type 2 diabetes mellitus without complications: Secondary | ICD-10-CM

## 2022-11-01 DIAGNOSIS — I10 Essential (primary) hypertension: Secondary | ICD-10-CM

## 2022-11-02 ENCOUNTER — Other Ambulatory Visit: Payer: Self-pay | Admitting: Endocrinology

## 2022-11-02 DIAGNOSIS — E1165 Type 2 diabetes mellitus with hyperglycemia: Secondary | ICD-10-CM

## 2022-11-27 ENCOUNTER — Other Ambulatory Visit (INDEPENDENT_AMBULATORY_CARE_PROVIDER_SITE_OTHER): Payer: 59

## 2022-11-27 DIAGNOSIS — E1165 Type 2 diabetes mellitus with hyperglycemia: Secondary | ICD-10-CM | POA: Diagnosis not present

## 2022-11-27 LAB — BASIC METABOLIC PANEL
BUN: 18 mg/dL (ref 6–23)
CO2: 29 mEq/L (ref 19–32)
Calcium: 9.8 mg/dL (ref 8.4–10.5)
Chloride: 99 mEq/L (ref 96–112)
Creatinine, Ser: 0.87 mg/dL (ref 0.40–1.50)
GFR: 92.25 mL/min (ref >60.00)
Glucose, Bld: 113 mg/dL — ABNORMAL HIGH (ref 70–99)
Potassium: 4.1 mEq/L (ref 3.5–5.1)
Sodium: 137 mEq/L (ref 135–145)

## 2022-11-27 LAB — HEMOGLOBIN A1C: Hgb A1c MFr Bld: 6.3 % (ref 4.6–6.5)

## 2022-11-30 ENCOUNTER — Other Ambulatory Visit: Payer: Self-pay | Admitting: Endocrinology

## 2022-11-30 DIAGNOSIS — E1165 Type 2 diabetes mellitus with hyperglycemia: Secondary | ICD-10-CM

## 2022-12-04 ENCOUNTER — Encounter: Payer: Self-pay | Admitting: Family Medicine

## 2022-12-04 ENCOUNTER — Encounter: Payer: Self-pay | Admitting: Endocrinology

## 2022-12-04 ENCOUNTER — Ambulatory Visit: Payer: 59 | Admitting: Family Medicine

## 2022-12-04 ENCOUNTER — Ambulatory Visit: Payer: 59 | Admitting: Endocrinology

## 2022-12-04 VITALS — BP 118/80 | HR 81 | Temp 98.1°F | Resp 17 | Ht 71.6 in | Wt 211.4 lb

## 2022-12-04 VITALS — BP 138/80 | HR 87 | Ht 71.6 in | Wt 212.6 lb

## 2022-12-04 DIAGNOSIS — E785 Hyperlipidemia, unspecified: Secondary | ICD-10-CM | POA: Diagnosis not present

## 2022-12-04 DIAGNOSIS — Z7984 Long term (current) use of oral hypoglycemic drugs: Secondary | ICD-10-CM | POA: Diagnosis not present

## 2022-12-04 DIAGNOSIS — E1169 Type 2 diabetes mellitus with other specified complication: Secondary | ICD-10-CM

## 2022-12-04 DIAGNOSIS — E669 Obesity, unspecified: Secondary | ICD-10-CM | POA: Diagnosis not present

## 2022-12-04 DIAGNOSIS — I10 Essential (primary) hypertension: Secondary | ICD-10-CM | POA: Diagnosis not present

## 2022-12-04 DIAGNOSIS — Z7985 Long-term (current) use of injectable non-insulin antidiabetic drugs: Secondary | ICD-10-CM

## 2022-12-04 DIAGNOSIS — E119 Type 2 diabetes mellitus without complications: Secondary | ICD-10-CM

## 2022-12-04 LAB — CBC WITH DIFFERENTIAL/PLATELET
Basophils Absolute: 0.1 10*3/uL (ref 0.0–0.1)
Basophils Relative: 0.9 % (ref 0.0–3.0)
Eosinophils Absolute: 0.3 10*3/uL (ref 0.0–0.7)
Eosinophils Relative: 3.7 % (ref 0.0–5.0)
HCT: 44.1 % (ref 39.0–52.0)
Hemoglobin: 15.4 g/dL (ref 13.0–17.0)
Lymphocytes Relative: 32 % (ref 12.0–46.0)
Lymphs Abs: 2.3 10*3/uL (ref 0.7–4.0)
MCHC: 34.9 g/dL (ref 30.0–36.0)
MCV: 93.3 fl (ref 78.0–100.0)
Monocytes Absolute: 0.6 10*3/uL (ref 0.1–1.0)
Monocytes Relative: 8 % (ref 3.0–12.0)
Neutro Abs: 4 10*3/uL (ref 1.4–7.7)
Neutrophils Relative %: 55.4 % (ref 43.0–77.0)
Platelets: 375 10*3/uL (ref 150.0–400.0)
RBC: 4.72 Mil/uL (ref 4.22–5.81)
RDW: 12.3 % (ref 11.5–15.5)
WBC: 7.2 10*3/uL (ref 4.0–10.5)

## 2022-12-04 LAB — LIPID PANEL
Cholesterol: 140 mg/dL (ref 0–200)
HDL: 49.7 mg/dL (ref >39.00)
LDL Cholesterol: 64 mg/dL (ref 0–99)
NonHDL: 90.44
Total CHOL/HDL Ratio: 3
Triglycerides: 131 mg/dL (ref 0.0–149.0)
VLDL: 26.2 mg/dL (ref 0.0–40.0)

## 2022-12-04 LAB — HEPATIC FUNCTION PANEL
ALT: 29 U/L (ref 0–53)
AST: 25 U/L (ref 0–37)
Albumin: 4.7 g/dL (ref 3.5–5.2)
Alkaline Phosphatase: 43 U/L (ref 39–117)
Bilirubin, Direct: 0.1 mg/dL (ref 0.0–0.3)
Total Bilirubin: 0.5 mg/dL (ref 0.2–1.2)
Total Protein: 8 g/dL (ref 6.0–8.3)

## 2022-12-04 LAB — TSH: TSH: 0.84 u[IU]/mL (ref 0.35–5.50)

## 2022-12-04 MED ORDER — MELOXICAM 7.5 MG PO TABS: 7.5000 mg | ORAL_TABLET | Freq: Every day | ORAL | 1 refills | Status: DC

## 2022-12-04 NOTE — Patient Instructions (Signed)
Schedule your complete physical in 6 months We'll notify you of your lab results and make any changes if needed Keep up the good work on healthy diet and regular exercise- you're down 5 lbs!! Call with any questions or concerns Stay Safe!  Stay Healthy! Have a great summer!!

## 2022-12-04 NOTE — Progress Notes (Signed)
Patient ID: Kevin Barnes, male   DOB: [birth year], 62 y.o.   MRN: [id number]            Reason for Appointment: Follow-up    History of Present Illness:          Date of diagnosis of type 2 diabetes mellitus:  2013      Background history:  He was diagnosed to have diabetes with a glucose of 132 in 2013 and baseline A1c of 6.4 He was started on metformin in 01/2013 when blood sugars were higher His blood sugars were reasonably controlled in the first 2 years or so, he was also given glipizide initially in 2015 but this caused hypoglycemia and he did not continue this. He had been followed up relatively sporadically He was also tried on Januvia subsequently in 2017 when his blood sugars were higher but this was changed to [place]  He did not take this because of the cost   A1c baseline in February 2018 was 9.2  Recent history:   His A1c is stable at 6.3; previously as high as 8.2  Non-insulin hypoglycemic drugs the patient is taking are: Mounjaro 2.5 mg weekly, Jardiance 25 mg daily in evening, Metformin 1g bid, Amaryl 0.5 mg at suppertime  Current management, blood sugar patterns and problems identified: He did not bring his monitor today for download Has been on Mounjaro since 2023 August  Since his control has been fairly good with 2.5 dosage and he has abdominal cramping from 5 mg the dose was not increased He says that he has been generally watching his diet except when traveling although he usually is walking more while out of town Has maintained his weight or is slightly better since his last visit Generally Mounjaro does help him control his appetite He is starting to do a little walking on his own also now       Side effects from medications have : None, tries to take metformin with food  Hypoglycemia:  he feels low sugars if the blood sugar is <90  Glucose monitoring:  done 1 times a day         Glucometer: One Touch Verio .       Blood Glucose readings by  recall:   PRE-MEAL Fasting Lunch Dinner Bedtime Overall  Glucose range: 106-120s      Mean/median:        POST-MEAL PC Breakfast PC Lunch PC Dinner  Glucose range:  <190 ?  Mean/median:      Prior    PRE-MEAL Fasting Lunch Dinner Bedtime Overall  Glucose range: 99-131 91, 93 82  82-168  Mean/median:        POST-MEAL PC Breakfast PC Lunch PC Dinner  Glucose range:  172   Mean/median:        Self-care: The diet that the patient has been following is: tries to limit Drinks with sugar .     Meal times are:  Breakfast is at 7 am Lunch: Variable Dinner: 7 pm    Typical meal intake: Breakfast is small and may not always have a protein.  He will have  some times a day biscuit late morning His snacks will be peanut butter crackers or chips.                Dietician visit, most recent: 11/19/16               Weight history:  Wt Readings from Last 3 Encounters:  12/04/22 212  lb 9.6 oz (96.4 kg)  12/04/22 211 lb 6 oz (95.9 kg)  07/24/22 215 lb 9.6 oz (97.8 kg)    Glycemic control:   Lab Results  Component Value Date   HGBA1C 6.3 11/27/2022   HGBA1C 6.2 07/10/2022   HGBA1C 6.2 06/05/2022   Lab Results  Component Value Date   MICROALBUR 1.7 07/10/2022   LDLCALC 64 12/04/2022   CREATININE 0.87 11/27/2022   Lab Results  Component Value Date   MICRALBCREAT 1.7 07/10/2022    Lab Results  Component Value Date   FRUCTOSAMINE 295 (H) 11/19/2016      Allergies as of 12/04/2022   No Known Allergies      Medication List        Accurate as of Dec 04, 2022  3:06 PM. If you have any questions, ask your nurse or doctor.          atorvastatin 20 MG tablet Commonly known as: LIPITOR Take 1 tablet by mouth once daily   B-12 5000 MCG Subl Place under the tongue.   cholecalciferol 1000 units tablet Commonly known as: VITAMIN D Take 2,000 Units by mouth daily.   empagliflozin 25 MG Tabs tablet Commonly known as: Jardiance Take 1 tablet (25 mg total) by mouth  daily.   fenofibrate 160 MG tablet Take 1 tablet by mouth once daily   glimepiride 1 MG tablet Commonly known as: AMARYL TAKE 1 TABLET BY MOUTH ONCE DAILY BEFORE SUPPER   lisinopril-hydrochlorothiazide 20-25 MG tablet Commonly known as: ZESTORETIC Take 1 tablet by mouth once daily   meloxicam 7.5 MG tablet Commonly known as: MOBIC Take 1 tablet (7.5 mg total) by mouth daily.   metFORMIN 1000 MG tablet Commonly known as: GLUCOPHAGE TAKE 1 TABLET BY MOUTH TWICE DAILY WITH A MEAL   Mounjaro 2.5 MG/0.5ML Pen Generic drug: tirzepatide INJECT 1/2 (ONE-HALF) ML  ONCE A WEEK   Omega-3 Krill Oil 1000 MG Caps Take by mouth.   OneTouch Verio test strip Generic drug: glucose blood USE 1 STRIP TO CHECK GLUCOSE 1 TO 2 TIMES DAILY   OVER THE COUNTER MEDICATION Sunny Mood and Curamin   SALMON OIL PO Take by mouth as directed.   sildenafil 50 MG tablet Commonly known as: VIAGRA TAKE 2 TABLETS BY MOUTH AS NEEDED FOR ERECTILE DYSFUNCTION        Allergies: No Known Allergies  Past Medical History:  Diagnosis Date   Allergy    Basal cell carcinoma of cheek 2016   Diabetes mellitus    Hyperlipidemia    Hypertension    Obesity    Obesity     Past Surgical History:  Procedure Laterality Date   PROSTATE SURGERY      Family History  Problem Relation Age of Onset   Cancer Father        Prostate   Heart disease Father    Hyperlipidemia Father    Hypertension Father    Heart failure Mother    Stroke Paternal Grandfather    Diabetes Neg Hx     Social History:  reports that he has quit smoking. He has never used smokeless tobacco. He reports current alcohol use of about 1.0 standard drink of alcohol per week. He reports that he does not use drugs.   Review of Systems  Lipid history: He has had mostly high triglycerides  He is taking fenofibrate in addition to his Lipitor  This is followed by PCP and cardiologist Last triglycerides better  Labs as follows  Lab Results  Component Value Date   CHOL 140 12/04/2022   CHOL 134 06/05/2022   CHOL 145 12/04/2021   Lab Results  Component Value Date   HDL 49.70 12/04/2022   HDL 49.20 06/05/2022   HDL 47.90 12/04/2021   Lab Results  Component Value Date   LDLCALC 64 12/04/2022   LDLCALC 53 06/05/2022   LDLCALC 58 12/04/2021   Lab Results  Component Value Date   TRIG 131.0 12/04/2022   TRIG 160.0 (H) 06/05/2022   TRIG 192.0 (H) 12/04/2021   Lab Results  Component Value Date   CHOLHDL 3 12/04/2022   CHOLHDL 3 06/05/2022   CHOLHDL 3 12/04/2021   Lab Results  Component Value Date   LDLDIRECT 87.0 09/19/2020   LDLDIRECT 72.0 03/15/2020   LDLDIRECT 111.0 06/24/2017            Hypertension: Has had this for several years, taking lisinopril HCTZ from his PCP   BP Readings from Last 3 Encounters:  12/04/22 138/80  12/04/22 118/80  07/24/22 126/88   Hypercalcemia: His calcium was high on his initial consultation and also on another occasion Subsequent calcium levels have been normal  PTH previously normal   Lab Results  Component Value Date   CALCIUM 9.8 11/27/2022   Lab Results  Component Value Date   PTH 21 09/24/2017   CALCIUM 9.8 11/27/2022     Most recent eye exam was 4/23,  Most recent foot exam: 4/23   LABS:  Office Visit on 12/04/2022  Component Date Value Ref Range Status   Cholesterol 12/04/2022 140  0 - 200 mg/dL Final   ATP III Classification       Desirable:  < 200 mg/dL               Borderline High:  200 - 239 mg/dL          High:  > = 161 mg/dL   Triglycerides 09/60/4540 131.0  0.0 - 149.0 mg/dL Final   Normal:  <981 mg/dLBorderline High:  150 - 199 mg/dL   HDL 19/14/7829 56.21  >39.00 mg/dL Final   VLDL 30/86/5784 26.2  0.0 - 40.0 mg/dL Final   LDL Cholesterol 12/04/2022 64  0 - 99 mg/dL Final   Total CHOL/HDL Ratio 12/04/2022 3   Final                  Men          Women1/2 Average Risk     3.4          3.3Average Risk          5.0           4.42X Average Risk          9.6          7.13X Average Risk          15.0          11.0                       NonHDL 12/04/2022 90.44   Final   NOTE:  Non-HDL goal should be 30 mg/dL higher than patient's LDL goal (i.e. LDL goal of < 70 mg/dL, would have non-HDL goal of < 100 mg/dL)   TSH 69/62/9528 4.13  0.35 - 5.50 uIU/mL Final   Total Bilirubin 12/04/2022 0.5  0.2 - 1.2 mg/dL Final   Bilirubin, Direct 12/04/2022 0.1  0.0 - 0.3 mg/dL Final  Alkaline Phosphatase 12/04/2022 43  39 - 117 U/L Final   AST 12/04/2022 25  0 - 37 U/L Final   ALT 12/04/2022 29  0 - 53 U/L Final   Total Protein 12/04/2022 8.0  6.0 - 8.3 g/dL Final   Albumin 81/19/1478 4.7  3.5 - 5.2 g/dL Final   WBC 29/56/2130 7.2  4.0 - 10.5 K/uL Final   RBC 12/04/2022 4.72  4.22 - 5.81 Mil/uL Final   Hemoglobin 12/04/2022 15.4  13.0 - 17.0 g/dL Final   HCT 86/57/8469 44.1  39.0 - 52.0 % Final   MCV 12/04/2022 93.3  78.0 - 100.0 fl Final   MCHC 12/04/2022 34.9  30.0 - 36.0 g/dL Final   RDW 62/95/2841 12.3  11.5 - 15.5 % Final   Platelets 12/04/2022 375.0  150.0 - 400.0 K/uL Final   Neutrophils Relative % 12/04/2022 55.4  43.0 - 77.0 % Final   Lymphocytes Relative 12/04/2022 32.0  12.0 - 46.0 % Final   Monocytes Relative 12/04/2022 8.0  3.0 - 12.0 % Final   Eosinophils Relative 12/04/2022 3.7  0.0 - 5.0 % Final   Basophils Relative 12/04/2022 0.9  0.0 - 3.0 % Final   Neutro Abs 12/04/2022 4.0  1.4 - 7.7 K/uL Final   Lymphs Abs 12/04/2022 2.3  0.7 - 4.0 K/uL Final   Monocytes Absolute 12/04/2022 0.6  0.1 - 1.0 K/uL Final   Eosinophils Absolute 12/04/2022 0.3  0.0 - 0.7 K/uL Final   Basophils Absolute 12/04/2022 0.1  0.0 - 0.1 K/uL Final    Physical Examination:  BP 138/80 (BP Location: Left Arm, Patient Position: Sitting, Cuff Size: Normal)   Pulse 87   Ht 5' 11.6" (1.819 m)   Wt 212 lb 9.6 oz (96.4 kg)   SpO2 99%   BMI 29.16 kg/m       ASSESSMENT:  Diabetes type 2 non-insulin-dependent  See history of present  illness for detailed discussion of current diabetes management, blood sugar patterns and problems identified  His A1c is about the same at 6.3, previously 6.2   He is taking Mounjaro 2.5 mg weekly, metformin 2 g daily, Jardiance 25 mg and Amaryl 0.5 mg  His blood sugar control is excellent with continuing low-dose Mounjaro Blood sugars at home are mildly increased fasting but not checked at other times and unclear how often he is having high postprandial readings Also no hypoglycemia   PLAN:    Continue 2.5 mg Mounjaro along with other medications He will check blood sugars by rotation at different times Again reminded him to have a regular exercise program Call if blood sugars are unusually high or low and consider stopping Amaryl if he has any tendency to hypoglycemia  Follow-up in 4 months   Patient Instructions  Check blood sugars on waking up 2-3 days a week  Also check blood sugars about 2 hours after meals and do this after different meals by rotation  Recommended blood sugar levels on waking up are 90-130 and about 2 hours after meal is 130-160  Please bring your blood sugar monitor to each visit, thank you     Reather Littler 12/04/2022, 3:06 PM   Note: This office note was prepared with Dragon voice recognition system technology. Any transcriptional errors that result from this process are unintentional.

## 2022-12-04 NOTE — Assessment & Plan Note (Signed)
Chronic problem.  Well controlled on Lisinopril HCTZ 20/25mg  daily.  Currently asymptomatic.  Check labs due to ACE and diuretic use but no anticipated med changes.  Will follow.

## 2022-12-04 NOTE — Progress Notes (Signed)
   Subjective:    Patient ID: Kevin Barnes, male    DOB: 1961/07/03, 62 y.o.   MRN: [id number]  HPI HTN- chronic problem, on Lisinopril HCTZ 20/25mg  daily w/ good control.  No CP, SOB, HA's, visual changes, edema.  Hyperlipidemia- chronic problem, on Atorvastatin 20mg  daily, Fenofibrate 160mg  daily.  No abd pain, N/V.  DM- chronic problem, on Glimepiride 1mg  daily, Metformin 1000mg  BID, Jardiance 25mg  daily, Mounjaro 2.5mg  weekly.  Pt is down 5 lbs since January.  Limited exercise.  Due for foot exam.  Will get records for eye exam.   Review of Systems For ROS see HPI     Objective:   Physical Exam Vitals reviewed.  Constitutional:      General: He is not in acute distress.    Appearance: Normal appearance. He is well-developed. He is not ill-appearing.  HENT:     Head: Normocephalic and atraumatic.  Eyes:     Extraocular Movements: Extraocular movements intact.     Conjunctiva/sclera: Conjunctivae normal.     Pupils: Pupils are equal, round, and reactive to light.  Neck:     Thyroid: No thyromegaly.  Cardiovascular:     Rate and Rhythm: Normal rate and regular rhythm.     Pulses: Normal pulses.     Heart sounds: Normal heart sounds. No murmur heard. Pulmonary:     Effort: Pulmonary effort is normal. No respiratory distress.     Breath sounds: Normal breath sounds.  Abdominal:     General: Bowel sounds are normal. There is no distension.     Palpations: Abdomen is soft.  Musculoskeletal:     Cervical back: Normal range of motion and neck supple.     Right lower leg: No edema.     Left lower leg: No edema.  Lymphadenopathy:     Cervical: No cervical adenopathy.  Skin:    General: Skin is warm and dry.  Neurological:     General: No focal deficit present.     Mental Status: He is alert and oriented to person, place, and time.     Cranial Nerves: No cranial nerve deficit.  Psychiatric:        Mood and Affect: Mood normal.        Behavior: Behavior normal.            Assessment & Plan:

## 2022-12-04 NOTE — Assessment & Plan Note (Signed)
Chronic problem.  Following w/ Endo.  Foot exam done today.  Will get records from eye doctor.  Applauded his weight loss.  Will continue to follow along.

## 2022-12-04 NOTE — Assessment & Plan Note (Signed)
Chronic problem.  On Atorvastatin 20mg  daily and Fenofibrate 160mg  daily w/o difficulty.  Check labs.  Adjust meds prn

## 2022-12-04 NOTE — Patient Instructions (Signed)
Check blood sugars on waking up 2-3 days a week  Also check blood sugars about 2 hours after meals and do this after different meals by rotation  Recommended blood sugar levels on waking up are 90-130 and about 2 hours after meal is 130-160  Please bring your blood sugar monitor to each visit, thank you   

## 2022-12-05 ENCOUNTER — Telehealth: Payer: Self-pay

## 2022-12-05 NOTE — Progress Notes (Signed)
Pt was notified.  

## 2022-12-05 NOTE — Telephone Encounter (Signed)
Pt aware of lab results 

## 2022-12-05 NOTE — Telephone Encounter (Signed)
-----   Message from Sheliah Hatch, MD sent at 12/05/2022  7:28 AM EDT ----- Labs look great!  No changes at this time

## 2022-12-28 ENCOUNTER — Other Ambulatory Visit: Payer: Self-pay | Admitting: Endocrinology

## 2022-12-28 DIAGNOSIS — E1165 Type 2 diabetes mellitus with hyperglycemia: Secondary | ICD-10-CM

## 2023-01-07 ENCOUNTER — Other Ambulatory Visit: Payer: Self-pay | Admitting: Endocrinology

## 2023-01-07 ENCOUNTER — Other Ambulatory Visit: Payer: Self-pay | Admitting: Family Medicine

## 2023-01-07 DIAGNOSIS — E119 Type 2 diabetes mellitus without complications: Secondary | ICD-10-CM

## 2023-01-07 DIAGNOSIS — N529 Male erectile dysfunction, unspecified: Secondary | ICD-10-CM

## 2023-01-31 ENCOUNTER — Other Ambulatory Visit: Payer: Self-pay | Admitting: Family Medicine

## 2023-01-31 DIAGNOSIS — I10 Essential (primary) hypertension: Secondary | ICD-10-CM

## 2023-01-31 DIAGNOSIS — E119 Type 2 diabetes mellitus without complications: Secondary | ICD-10-CM

## 2023-02-02 ENCOUNTER — Other Ambulatory Visit: Payer: Self-pay | Admitting: Family Medicine

## 2023-04-08 ENCOUNTER — Other Ambulatory Visit: Payer: 59

## 2023-04-09 ENCOUNTER — Telehealth: Payer: Self-pay | Admitting: Endocrinology

## 2023-04-09 ENCOUNTER — Other Ambulatory Visit: Payer: Self-pay

## 2023-04-09 DIAGNOSIS — E119 Type 2 diabetes mellitus without complications: Secondary | ICD-10-CM

## 2023-04-09 MED ORDER — METFORMIN HCL 1000 MG PO TABS
1000.0000 mg | ORAL_TABLET | Freq: Two times a day (BID) | ORAL | 0 refills | Status: DC
Start: 2023-04-09 — End: 2023-07-03

## 2023-04-09 NOTE — Telephone Encounter (Signed)
Metformin has been sent to [facility] per patient

## 2023-04-09 NOTE — Telephone Encounter (Signed)
Patient advising he is out of his Metformin , and Pharmacy will not refill his rx due to Dr. Lucianne Muss retiring. Please call when done- [facility] on [address].

## 2023-04-15 ENCOUNTER — Ambulatory Visit: Payer: 59 | Admitting: Endocrinology

## 2023-04-15 ENCOUNTER — Other Ambulatory Visit (INDEPENDENT_AMBULATORY_CARE_PROVIDER_SITE_OTHER): Payer: 59

## 2023-04-15 DIAGNOSIS — E1169 Type 2 diabetes mellitus with other specified complication: Secondary | ICD-10-CM | POA: Diagnosis not present

## 2023-04-15 DIAGNOSIS — E669 Obesity, unspecified: Secondary | ICD-10-CM | POA: Diagnosis not present

## 2023-04-15 LAB — BASIC METABOLIC PANEL
BUN: 14 mg/dL (ref 6–23)
CO2: 27 mEq/L (ref 19–32)
Calcium: 10 mg/dL (ref 8.4–10.5)
Chloride: 100 mEq/L (ref 96–112)
Creatinine, Ser: 0.82 mg/dL (ref 0.40–1.50)
GFR: 93.67 mL/min (ref >60.00)
Glucose, Bld: 118 mg/dL — ABNORMAL HIGH (ref 70–99)
Potassium: 4.3 mEq/L (ref 3.5–5.1)
Sodium: 137 mEq/L (ref 135–145)

## 2023-04-15 LAB — HEMOGLOBIN A1C: Hgb A1c MFr Bld: 6.2 % (ref 4.6–6.5)

## 2023-04-23 ENCOUNTER — Other Ambulatory Visit: Payer: Self-pay

## 2023-04-23 ENCOUNTER — Encounter: Payer: Self-pay | Admitting: Endocrinology

## 2023-04-23 ENCOUNTER — Ambulatory Visit: Payer: 59 | Admitting: Endocrinology

## 2023-04-23 VITALS — BP 115/80 | HR 85 | Ht 71.6 in | Wt 208.6 lb

## 2023-04-23 DIAGNOSIS — Z7984 Long term (current) use of oral hypoglycemic drugs: Secondary | ICD-10-CM | POA: Diagnosis not present

## 2023-04-23 DIAGNOSIS — Z7985 Long-term (current) use of injectable non-insulin antidiabetic drugs: Secondary | ICD-10-CM | POA: Diagnosis not present

## 2023-04-23 DIAGNOSIS — E1169 Type 2 diabetes mellitus with other specified complication: Secondary | ICD-10-CM | POA: Diagnosis not present

## 2023-04-23 DIAGNOSIS — E669 Obesity, unspecified: Secondary | ICD-10-CM

## 2023-04-23 MED ORDER — ONETOUCH VERIO VI STRP: ORAL_STRIP | 3 refills | Status: AC

## 2023-04-23 MED ORDER — GLIMEPIRIDE 1 MG PO TABS: ORAL_TABLET | ORAL | 3 refills | Status: DC

## 2023-04-23 NOTE — Progress Notes (Signed)
Outpatient Endocrinology Note [name], MD  04/23/23  Patient's Name: Kevin Barnes    DOB: 01/08/1961    MRN: [id number]                                                    REASON OF VISIT: Follow up for type 2 diabetes mellitus  PCP: Sheliah Hatch, MD  HISTORY OF PRESENT ILLNESS:   Kevin Barnes is a 62 y.o. old male with past medical history listed below, is here for follow up of type 2 diabetes mellitus.   Pertinent Diabetes History: Patient was diagnosed with type 2 diabetes mellitus in 2013.  He was initially treated with metformin starting in July 2014.  He has controlled type 2 diabetes mellitus.  Chronic Diabetes Complications : Retinopathy: no. Last ophthalmology exam was done on annual, in January. Nephropathy: no, on lisinopril. Peripheral neuropathy: no Coronary artery disease: no Stroke: no  Relevant comorbidities and cardiovascular risk factors: Obesity: no Body mass index is 28.61 kg/m.  Hypertension: yes Hyperlipidemia. Yes, on a statin and fenofibrate.  Current / Home Diabetic regimen includes: Mounjaro 2.5 mg weekly. Jardiance 25 mg daily. Metformin 1000 mg 2 times a day. Amaryl 1 mg at suppertime.  Prior diabetic medications: Januvia, Onglyza, glipizide. Had muscle cramp /abdominal cramp and pain on higher dose of Mounjaro 5 mg weekly.  Glycemic data:   She has One Touch Verio glucometer data is not able to download in the clinic today.  However blood sugar reviewed from the glucometer directly.  Some of the blood sugar as follows.  He has been checking mostly in the morning and sometime around noon time before lunch.Some of glucose are 117, 88, 98, 1334, 117, 129, 87, 112, 75, 85, 105, 94, 105, 86   Hypoglycemia: Patient has no hypoglycemic episodes. Patient has hypoglycemia awareness.  Factors modifying glucose control: 1.  Diabetic diet assessment: 3 meals a day.  Tries to limit drinks with sugar.  2.  Staying active or exercising:    3.  Medication compliance: compliant all of the time.  Interval history 04/23/23 Patient is able to tolerate Mounjaro 2.5 mg weekly without symptoms.  He had increase Amaryl from 0.5 mg to 1 mg when he had mild hypoglycemia with blood sugar in 140s.  Limited data as reviewed above.  No hypoglycemia.  Recent hemoglobin A1c 6.2%.  REVIEW OF SYSTEMS As per history of present illness.   PAST MEDICAL HISTORY: Past Medical History:  Diagnosis Date   Allergy    Basal cell carcinoma of cheek 2016   Diabetes mellitus    Hyperlipidemia    Hypertension    Obesity    Obesity     PAST SURGICAL HISTORY: Past Surgical History:  Procedure Laterality Date   PROSTATE SURGERY      ALLERGIES: No Known Allergies  FAMILY HISTORY:  Family History  Problem Relation Age of Onset   Cancer Father        Prostate   Heart disease Father    Hyperlipidemia Father    Hypertension Father    Heart failure Mother    Stroke Paternal Grandfather    Diabetes Neg Hx     SOCIAL HISTORY: Social History   Socioeconomic History   Marital status: Married    Spouse name: Not on file   Number of  children: Not on file   Years of education: 12   Highest education level: Not on file  Occupational History   Occupation: dispatcher    Employer: [employer]  Tobacco Use   Smoking status: Former   Smokeless tobacco: Never  Vaping Use   Vaping status: Never Used  Substance and Sexual Activity   Alcohol use: Yes    Alcohol/week: 1.0 standard drink of alcohol    Types: 1 Standard drinks or equivalent per week    Comment: 1 drink   Drug use: No   Sexual activity: Yes    Birth control/protection: None  Other Topics Concern   Not on file  Social History Narrative   Not on file   Social Determinants of Health   Financial Resource Strain: Not on file  Food Insecurity: Not on file  Transportation Needs: Not on file  Physical Activity: Not on file  Stress: Not on file  Social Connections: Not on  file    MEDICATIONS:  Current Outpatient Medications  Medication Sig Dispense Refill   atorvastatin (LIPITOR) 20 MG tablet Take 1 tablet by mouth once daily 90 tablet 0   cholecalciferol (VITAMIN D) 1000 units tablet Take 2,000 Units by mouth daily.     Cyanocobalamin (B-12) 5000 MCG SUBL Place under the tongue.     empagliflozin (JARDIANCE) 25 MG TABS tablet Take 1 tablet (25 mg total) by mouth daily. 90 tablet 2   fenofibrate 160 MG tablet Take 1 tablet by mouth once daily 90 tablet 0   glucose blood (ONETOUCH VERIO) test strip USE 1 STRIP TO CHECK GLUCOSE 1 TO 2 TIMES DAILY 100 each 3   lisinopril-hydrochlorothiazide (ZESTORETIC) 20-25 MG tablet Take 1 tablet by mouth once daily 90 tablet 0   meloxicam (MOBIC) 7.5 MG tablet Take 1 tablet (7.5 mg total) by mouth daily. 90 tablet 1   metFORMIN (GLUCOPHAGE) 1000 MG tablet Take 1 tablet (1,000 mg total) by mouth 2 (two) times daily with a meal. 180 tablet 0   Omega-3 Fatty Acids (SALMON OIL PO) Take by mouth as directed.     OVER THE COUNTER MEDICATION Sunny Mood and Curamin     sildenafil (VIAGRA) 50 MG tablet TAKE 2 TABLETS BY MOUTH AS NEEDED FOR ERECTILE DYSFUNCTION 18 tablet 0   tirzepatide (MOUNJARO) 2.5 MG/0.5ML Pen INJECT 0.5 ML(2.5 MG TOTAL) SUBCUTANEOUSLY ONCE A WEEK 4 mL 2   glimepiride (AMARYL) 1 MG tablet TAKE 1 TABLET BY MOUTH ONCE DAILY BEFORE  SUPPER 90 tablet 3   Omega-3 Krill Oil 1000 MG CAPS Take by mouth. (Patient not taking: Reported on 04/23/2023)     No current facility-administered medications for this visit.    PHYSICAL EXAM: Vitals:   04/23/23 0754  BP: 115/80  Pulse: 85  SpO2: 99%  Weight: 208 lb 9.6 oz (94.6 kg)  Height: 5' 11.6" (1.819 m)   Body mass index is 28.61 kg/m.  Wt Readings from Last 3 Encounters:  04/23/23 208 lb 9.6 oz (94.6 kg)  12/04/22 212 lb 9.6 oz (96.4 kg)  12/04/22 211 lb 6 oz (95.9 kg)    General: Well developed, well nourished male in no apparent distress.  HEENT: AT/[place], no  external lesions.  Eyes: Conjunctiva clear and no icterus. Neck: Neck supple  Lungs: Respirations not labored Neurologic: Alert, oriented, normal speech Extremities / Skin: Dry.   Psychiatric: Does not appear depressed or anxious  Diabetic Foot Exam - Simple   No data filed    LABS Reviewed  Lab Results  Component Value Date   HGBA1C 6.2 04/15/2023   HGBA1C 6.3 11/27/2022   HGBA1C 6.2 07/10/2022   Lab Results  Component Value Date   FRUCTOSAMINE 295 (H) 11/19/2016   Lab Results  Component Value Date   CHOL 140 12/04/2022   HDL 49.70 12/04/2022   LDLCALC 64 12/04/2022   LDLDIRECT 87.0 09/19/2020   TRIG 131.0 12/04/2022   CHOLHDL 3 12/04/2022   Lab Results  Component Value Date   MICRALBCREAT 1.7 07/10/2022   MICRALBCREAT 1.7 06/05/2022   Lab Results  Component Value Date   CREATININE 0.82 04/15/2023   Lab Results  Component Value Date   GFR 93.67 04/15/2023    ASSESSMENT / PLAN  1. Type 2 diabetes mellitus with obesity (HCC)     Diabetes Mellitus type 2, complicated by no known complications. - Diabetic status / severity: Controlled  Lab Results  Component Value Date   HGBA1C 6.2 04/15/2023    - Hemoglobin A1c goal : <7%  - Medications: no change.  I) continue Mounjaro 2.5 mg weekly. II) continue metformin 1000 mg 2 times a day. III) continue Jardiance 25 mg daily. IV) continue Amaryl 1 mg daily.  - Home glucose testing: In the morning fasting and at bedtime. - Discussed/ Gave Hypoglycemia treatment plan.  # Consult : not required at this time.   # Annual urine for microalbuminuria/ creatinine ratio, no microalbuminuria currently, continue ACE/ARB /lisinopril, will check in next follow-up visit. Last  Lab Results  Component Value Date   MICRALBCREAT 1.7 07/10/2022    # Foot check nightly.  # Annual dilated diabetic eye exams.   - Diet: Make healthy diabetic food choices - Life style / activity / exercise: Discussed.  2. Blood  pressure  -  BP Readings from Last 1 Encounters:  04/23/23 115/80    - Control is in target.  - No change in current plans.  3. Lipid status / Hyperlipidemia - Last  Lab Results  Component Value Date   LDLCALC 64 12/04/2022   - Continue atorvastatin 20 mg daily and fenofibrate 160 mg daily.  Diagnoses and all orders for this visit:  Type 2 diabetes mellitus with obesity (HCC) -     Hemoglobin A1c; Future -     Microalbumin / creatinine urine ratio; Future  Other orders -     glimepiride (AMARYL) 1 MG tablet; TAKE 1 TABLET BY MOUTH ONCE DAILY BEFORE  SUPPER    DISPOSITION Follow up in clinic in  4 months suggested.   All questions answered and patient verbalized understanding of the plan.  [place] Juniel Groene, MD [facility] Endocrinology [facility] [address] [place], [address] [place], [place] [address] Phone # [phone]  At least part of this note was generated using voice recognition software. Inadvertent word errors may have occurred, which were not recognized during the proofreading process.

## 2023-04-23 NOTE — Patient Instructions (Signed)
No change on diabetes medication.

## 2023-04-28 ENCOUNTER — Other Ambulatory Visit: Payer: Self-pay | Admitting: Family Medicine

## 2023-04-28 DIAGNOSIS — I10 Essential (primary) hypertension: Secondary | ICD-10-CM

## 2023-04-28 DIAGNOSIS — E119 Type 2 diabetes mellitus without complications: Secondary | ICD-10-CM

## 2023-04-30 ENCOUNTER — Encounter: Payer: Self-pay | Admitting: Endocrinology

## 2023-05-07 ENCOUNTER — Other Ambulatory Visit: Payer: Self-pay | Admitting: Family Medicine

## 2023-05-23 ENCOUNTER — Other Ambulatory Visit: Payer: Self-pay

## 2023-05-23 DIAGNOSIS — E1165 Type 2 diabetes mellitus with hyperglycemia: Secondary | ICD-10-CM

## 2023-05-23 DIAGNOSIS — E1169 Type 2 diabetes mellitus with other specified complication: Secondary | ICD-10-CM

## 2023-05-23 MED ORDER — EMPAGLIFLOZIN 25 MG PO TABS: 25.0000 mg | ORAL_TABLET | Freq: Every day | ORAL | 2 refills | Status: DC

## 2023-05-28 ENCOUNTER — Other Ambulatory Visit: Payer: Self-pay

## 2023-06-10 ENCOUNTER — Ambulatory Visit: Payer: 59 | Admitting: Family Medicine

## 2023-06-10 ENCOUNTER — Encounter: Payer: Self-pay | Admitting: Family Medicine

## 2023-06-10 VITALS — BP 132/80 | HR 71 | Temp 98.0°F | Wt 215.2 lb

## 2023-06-10 DIAGNOSIS — E1169 Type 2 diabetes mellitus with other specified complication: Secondary | ICD-10-CM

## 2023-06-10 DIAGNOSIS — Z0000 Encounter for general adult medical examination without abnormal findings: Secondary | ICD-10-CM | POA: Diagnosis not present

## 2023-06-10 DIAGNOSIS — Z125 Encounter for screening for malignant neoplasm of prostate: Secondary | ICD-10-CM | POA: Diagnosis not present

## 2023-06-10 DIAGNOSIS — E785 Hyperlipidemia, unspecified: Secondary | ICD-10-CM

## 2023-06-10 DIAGNOSIS — Z1159 Encounter for screening for other viral diseases: Secondary | ICD-10-CM | POA: Diagnosis not present

## 2023-06-10 DIAGNOSIS — Z114 Encounter for screening for human immunodeficiency virus [HIV]: Secondary | ICD-10-CM

## 2023-06-10 DIAGNOSIS — E669 Obesity, unspecified: Secondary | ICD-10-CM | POA: Diagnosis not present

## 2023-06-10 LAB — CBC WITH DIFFERENTIAL/PLATELET
Basophils Absolute: 0.1 10*3/uL (ref 0.0–0.1)
Basophils Relative: 0.9 % (ref 0.0–3.0)
Eosinophils Absolute: 0.3 10*3/uL (ref 0.0–0.7)
Eosinophils Relative: 4.1 % (ref 0.0–5.0)
HCT: 43.4 % (ref 39.0–52.0)
Hemoglobin: 14.7 g/dL (ref 13.0–17.0)
Lymphocytes Relative: 31.3 % (ref 12.0–46.0)
Lymphs Abs: 2.5 10*3/uL (ref 0.7–4.0)
MCHC: 33.9 g/dL (ref 30.0–36.0)
MCV: 94.6 fL (ref 78.0–100.0)
Monocytes Absolute: 0.8 10*3/uL (ref 0.1–1.0)
Monocytes Relative: 9.9 % (ref 3.0–12.0)
Neutro Abs: 4.3 10*3/uL (ref 1.4–7.7)
Neutrophils Relative %: 53.8 % (ref 43.0–77.0)
Platelets: 386 10*3/uL (ref 150.0–400.0)
RBC: 4.59 Mil/uL (ref 4.22–5.81)
RDW: 12.2 % (ref 11.5–15.5)
WBC: 8 10*3/uL (ref 4.0–10.5)

## 2023-06-10 LAB — BASIC METABOLIC PANEL
BUN: 18 mg/dL (ref 6–23)
CO2: 28 meq/L (ref 19–32)
Calcium: 10.5 mg/dL (ref 8.4–10.5)
Chloride: 98 meq/L (ref 96–112)
Creatinine, Ser: 0.88 mg/dL (ref 0.40–1.50)
GFR: 91.59 mL/min (ref >60.00)
Glucose, Bld: 106 mg/dL — ABNORMAL HIGH (ref 70–99)
Potassium: 4.6 meq/L (ref 3.5–5.1)
Sodium: 138 meq/L (ref 135–145)

## 2023-06-10 LAB — HEPATIC FUNCTION PANEL
ALT: 25 U/L (ref 0–53)
AST: 23 U/L (ref 0–37)
Albumin: 4.7 g/dL (ref 3.5–5.2)
Alkaline Phosphatase: 42 U/L (ref 39–117)
Bilirubin, Direct: 0.1 mg/dL (ref 0.0–0.3)
Total Bilirubin: 0.5 mg/dL (ref 0.2–1.2)
Total Protein: 7.4 g/dL (ref 6.0–8.3)

## 2023-06-10 LAB — LIPID PANEL
Cholesterol: 144 mg/dL (ref 0–200)
HDL: 45.7 mg/dL (ref >39.00)
LDL Cholesterol: 68 mg/dL (ref 0–99)
NonHDL: 98.58
Total CHOL/HDL Ratio: 3
Triglycerides: 151 mg/dL — ABNORMAL HIGH (ref 0.0–149.0)
VLDL: 30.2 mg/dL (ref 0.0–40.0)

## 2023-06-10 LAB — TSH: TSH: 0.73 u[IU]/mL (ref 0.35–5.50)

## 2023-06-10 LAB — PSA: PSA: 2.63 ng/mL (ref 0.10–4.00)

## 2023-06-10 NOTE — Progress Notes (Signed)
   Subjective:    Patient ID: Kevin Barnes, male    DOB: [birth year], 62 y.o.   MRN: [id number]  HPI CPE- UTD on colonoscopy, foot exam.  Has eye exam scheduled.  Patient Care Team    Relationship Specialty Notifications Start End  Sheliah Hatch, MD PCP - General Family Medicine  10/19/15   Barron Alvine, MD (Inactive) Consulting Physician Urology  01/29/17   Reather Littler, MD (Inactive) Consulting Physician Endocrinology  01/29/17   Dermatology, [facility] Physician   03/02/19      Health Maintenance  Topic Date Due   HIV Screening  Never done   Hepatitis C Screening  Never done   Zoster Vaccines- Shingrix (1 of 2) Never done   OPHTHALMOLOGY EXAM  10/31/2022   INFLUENZA VACCINE  02/21/2023   COVID-19 Vaccine (1 - 2023-24 season) Never done   Diabetic kidney evaluation - Urine ACR  07/11/2023   HEMOGLOBIN A1C  10/13/2023   FOOT EXAM  12/04/2023   Colonoscopy  01/09/2024   Diabetic kidney evaluation - eGFR measurement  04/14/2024   DTaP/Tdap/Td (2 - Td or Tdap) 01/08/2026   HPV VACCINES  Aged Out     Review of Systems Patient reports no vision/hearing changes, anorexia, fever ,adenopathy, persistant/recurrent hoarseness, swallowing issues, chest pain, palpitations, edema, persistant/recurrent cough, hemoptysis, dyspnea (rest,exertional, paroxysmal nocturnal), gastrointestinal  bleeding (melena, rectal bleeding), abdominal pain, excessive heart burn, GU symptoms (dysuria, hematuria, voiding/incontinence issues) syncope, focal weakness, memory loss, numbness & tingling, skin/hair/nail changes, depression, anxiety, abnormal bruising/bleeding, musculoskeletal symptoms/signs.     Objective:   Physical Exam General Appearance:    Alert, cooperative, no distress, appears stated age  Head:    Normocephalic, without obvious abnormality, atraumatic  Eyes:    PERRL, conjunctiva/corneas clear, EOM's intact both eyes       Ears:    Normal TM's and external ear canals, both ears   Nose:   Nares normal, septum midline, mucosa normal, no drainage   or sinus tenderness  Throat:   Lips, mucosa, and tongue normal; teeth and gums normal  Neck:   Supple, symmetrical, trachea midline, no adenopathy;       thyroid:  No enlargement/tenderness/nodules  Back:     Symmetric, no curvature, ROM normal, no CVA tenderness  Lungs:     Clear to auscultation bilaterally, respirations unlabored  Chest wall:    No tenderness or deformity  Heart:    Regular rate and rhythm, S1 and S2 normal, no murmur, rub   or gallop  Abdomen:     Soft, non-tender, bowel sounds active all four quadrants,    no masses, no organomegaly  Genitalia:    deferred  Rectal:    Extremities:   Extremities normal, atraumatic, no cyanosis or edema  Pulses:   2+ and symmetric all extremities  Skin:   Skin color, texture, turgor normal, no rashes or lesions  Lymph nodes:   Cervical, supraclavicular, and axillary nodes normal  Neurologic:   CNII-XII intact. Normal strength, sensation and reflexes      throughout          Assessment & Plan:

## 2023-06-10 NOTE — Patient Instructions (Addendum)
Follow up in 6 months to recheck blood pressure and cholesterol We'll notify you of your lab results and make any changes if needed Continue to work on healthy diet and regular exercise- you can do it! Call with any questions or concerns Stay Safe!  Stay Healthy! Happy Holidays!!

## 2023-06-10 NOTE — Assessment & Plan Note (Signed)
Pt's PE WNL w/ exception of BMI.  UTD on colonoscopy, foot exam.  Eye exam scheduled.  Check labs.  Anticipatory guidance provided.

## 2023-06-11 ENCOUNTER — Other Ambulatory Visit: Payer: Self-pay

## 2023-06-11 DIAGNOSIS — E1165 Type 2 diabetes mellitus with hyperglycemia: Secondary | ICD-10-CM

## 2023-06-11 LAB — MICROALBUMIN / CREATININE URINE RATIO
Creatinine,U: 49.7 mg/dL
Microalb Creat Ratio: 2.3 mg/g (ref 0.0–30.0)
Microalb, Ur: 1.1 mg/dL (ref 0.0–1.9)

## 2023-06-11 MED ORDER — MOUNJARO 2.5 MG/0.5ML ~~LOC~~ SOAJ: SUBCUTANEOUS | 2 refills | Status: DC

## 2023-06-12 ENCOUNTER — Telehealth: Payer: Self-pay

## 2023-06-12 LAB — HIV ANTIBODY (ROUTINE TESTING W REFLEX): HIV 1&2 Ab, 4th Generation: NONREACTIVE

## 2023-06-12 LAB — HEPATITIS C ANTIBODY: Hepatitis C Ab: NONREACTIVE

## 2023-06-12 NOTE — Telephone Encounter (Signed)
-----   Message from Neena Rhymes sent at 06/12/2023 11:51 AM EST ----- Labs look great!  No changes at this time

## 2023-07-03 ENCOUNTER — Other Ambulatory Visit: Payer: Self-pay | Admitting: Endocrinology

## 2023-07-03 DIAGNOSIS — E119 Type 2 diabetes mellitus without complications: Secondary | ICD-10-CM

## 2023-07-03 NOTE — Telephone Encounter (Signed)
Metformin refill request complete, patient made aware via mychart

## 2023-07-24 ENCOUNTER — Other Ambulatory Visit: Payer: Self-pay | Admitting: Family Medicine

## 2023-07-24 DIAGNOSIS — E119 Type 2 diabetes mellitus without complications: Secondary | ICD-10-CM

## 2023-07-24 DIAGNOSIS — I10 Essential (primary) hypertension: Secondary | ICD-10-CM

## 2023-08-02 ENCOUNTER — Other Ambulatory Visit: Payer: Self-pay | Admitting: Family Medicine

## 2023-08-05 ENCOUNTER — Other Ambulatory Visit: Payer: Self-pay | Admitting: Family Medicine

## 2023-08-08 ENCOUNTER — Other Ambulatory Visit: Payer: Self-pay | Admitting: Family Medicine

## 2023-08-11 ENCOUNTER — Other Ambulatory Visit: Payer: Self-pay | Admitting: Family Medicine

## 2023-08-14 ENCOUNTER — Encounter: Payer: Self-pay | Admitting: Family Medicine

## 2023-08-15 MED ORDER — FENOFIBRATE 160 MG PO TABS: 160.0000 mg | ORAL_TABLET | Freq: Every day | ORAL | 0 refills | Status: DC

## 2023-08-16 ENCOUNTER — Other Ambulatory Visit: Payer: Self-pay

## 2023-08-16 DIAGNOSIS — E1169 Type 2 diabetes mellitus with other specified complication: Secondary | ICD-10-CM

## 2023-08-19 ENCOUNTER — Other Ambulatory Visit: Payer: 59

## 2023-08-20 ENCOUNTER — Encounter: Payer: Self-pay | Admitting: Endocrinology

## 2023-08-20 LAB — HEMOGLOBIN A1C
Hgb A1c MFr Bld: 6.3 %{Hb} — ABNORMAL HIGH (ref <5.7)
Mean Plasma Glucose: 134 mg/dL
eAG (mmol/L): 7.4 mmol/L

## 2023-08-26 ENCOUNTER — Ambulatory Visit: Payer: 59 | Admitting: Endocrinology

## 2023-09-08 ENCOUNTER — Other Ambulatory Visit: Payer: Self-pay | Admitting: Family Medicine

## 2023-09-08 DIAGNOSIS — N529 Male erectile dysfunction, unspecified: Secondary | ICD-10-CM

## 2023-09-09 ENCOUNTER — Encounter: Payer: Self-pay | Admitting: Endocrinology

## 2023-09-09 ENCOUNTER — Ambulatory Visit: Payer: 59 | Admitting: Endocrinology

## 2023-09-09 VITALS — BP 116/70 | HR 95 | Resp 20 | Ht 71.6 in | Wt 206.6 lb

## 2023-09-09 DIAGNOSIS — Z7984 Long term (current) use of oral hypoglycemic drugs: Secondary | ICD-10-CM

## 2023-09-09 DIAGNOSIS — Z7985 Long-term (current) use of injectable non-insulin antidiabetic drugs: Secondary | ICD-10-CM

## 2023-09-09 DIAGNOSIS — E119 Type 2 diabetes mellitus without complications: Secondary | ICD-10-CM

## 2023-09-09 NOTE — Progress Notes (Signed)
Outpatient Endocrinology Note Kevin Lolly Glaus, MD  09/09/23  Patient's Name: Kevin Barnes    DOB: 05-27-1961    MRN: [id number]                                                    REASON OF VISIT: Follow up for type 2 diabetes mellitus  PCP: Sheliah Hatch, MD  HISTORY OF PRESENT ILLNESS:   Kevin Barnes is a 63 y.o. old male with past medical history listed below, is here for follow up of type 2 diabetes mellitus.   Pertinent Diabetes History: Patient was diagnosed with type 2 diabetes mellitus in 2013.  He was initially treated with metformin starting in July 2014.  He has controlled type 2 diabetes mellitus.  Chronic Diabetes Complications : Retinopathy: no. Last ophthalmology exam was done on annual, in January. Nephropathy: no, on lisinopril. Peripheral neuropathy: no Coronary artery disease: no Stroke: no  Relevant comorbidities and cardiovascular risk factors: Obesity: no Body mass index is 28.33 kg/m.  Hypertension: yes Hyperlipidemia. Yes, on a statin and fenofibrate.  Current / Home Diabetic regimen includes: Mounjaro 2.5 mg weekly. Jardiance 25 mg daily. Metformin 1000 mg 2 times a day. Amaryl 1 mg at suppertime.  Prior diabetic medications: Januvia, Onglyza, glipizide. Had muscle cramp /abdominal cramp and pain on higher dose of Mounjaro 5 mg weekly.  Glycemic data:   Glucometer One Touch Verio flex downloaded, he has checked once in last 2 weeks 103.  He has been checking blood sugar occasionally.  Hypoglycemia: Patient has no hypoglycemic episodes. Patient has hypoglycemia awareness.  Factors modifying glucose control: 1.  Diabetic diet assessment: 3 meals a day.  Tries to limit drinks with sugar.  2.  Staying active or exercising:   3.  Medication compliance: compliant all of the time.  Interval history  Diabetes regimen reviewed and as noted above.  Glucometer data 103 blood sugar in last 2 weeks.  No hypo and hyperglycemia.  No GI issues  regarding Mounjaro and metformin.  No other complaints today.  Hemoglobin A1c 6.3%.  REVIEW OF SYSTEMS As per history of present illness.   PAST MEDICAL HISTORY: Past Medical History:  Diagnosis Date   Allergy    Basal cell carcinoma of cheek 2016   Diabetes mellitus    Hyperlipidemia    Hypertension    Obesity    Obesity     PAST SURGICAL HISTORY: Past Surgical History:  Procedure Laterality Date   PROSTATE SURGERY      ALLERGIES: No Known Allergies  FAMILY HISTORY:  Family History  Problem Relation Age of Onset   Cancer Father        Prostate   Heart disease Father    Hyperlipidemia Father    Hypertension Father    Heart failure Mother    Stroke Paternal Grandfather    Diabetes Neg Hx     SOCIAL HISTORY: Social History   Socioeconomic History   Marital status: Married    Spouse name: Not on file   Number of children: Not on file   Years of education: 12   Highest education level: Not on file  Occupational History   Occupation: Engineer, drilling: [employer]  Tobacco Use   Smoking status: Former   Smokeless tobacco: Never  Vaping Use   Vaping status:  Never Used  Substance and Sexual Activity   Alcohol use: Yes    Alcohol/week: 1.0 standard drink of alcohol    Types: 1 Standard drinks or equivalent per week    Comment: 1 drink   Drug use: No   Sexual activity: Yes    Birth control/protection: None  Other Topics Concern   Not on file  Social History Narrative   Not on file   Social Drivers of Health   Financial Resource Strain: Not on file  Food Insecurity: Not on file  Transportation Needs: Not on file  Physical Activity: Not on file  Stress: Not on file  Social Connections: Not on file    MEDICATIONS:  Current Outpatient Medications  Medication Sig Dispense Refill   atorvastatin (LIPITOR) 20 MG tablet Take 1 tablet by mouth once daily 90 tablet 0   cholecalciferol (VITAMIN D) 1000 units tablet Take 2,000 Units by mouth daily.      Cyanocobalamin (B-12) 5000 MCG SUBL Place under the tongue.     empagliflozin (JARDIANCE) 25 MG TABS tablet Take 1 tablet (25 mg total) by mouth daily. 90 tablet 2   fenofibrate 160 MG tablet Take 1 tablet (160 mg total) by mouth daily. 90 tablet 0   glimepiride (AMARYL) 1 MG tablet TAKE 1 TABLET BY MOUTH ONCE DAILY BEFORE  SUPPER 90 tablet 3   glucose blood (ONETOUCH VERIO) test strip USE 1 STRIP TO CHECK GLUCOSE 1 TO 2 TIMES DAILY 100 each 3   lisinopril-hydrochlorothiazide (ZESTORETIC) 20-25 MG tablet Take 1 tablet by mouth once daily 90 tablet 0   meloxicam (MOBIC) 7.5 MG tablet Take 1 tablet (7.5 mg total) by mouth daily. 90 tablet 1   metFORMIN (GLUCOPHAGE) 1000 MG tablet TAKE 1 TABLET BY MOUTH TWICE DAILY WITH MEALS 180 tablet 0   Omega-3 Fatty Acids (SALMON OIL PO) Take by mouth as directed.     Omega-3 Krill Oil 1000 MG CAPS Take by mouth.     OVER THE COUNTER MEDICATION Sunny Mood and Curamin     tirzepatide [facility]) 2.5 MG/0.5ML Pen Inject 0.60ml(2.5mg ) subcutaneously once a week 4 mL 2   sildenafil (VIAGRA) 50 MG tablet TAKE 2 TABLETS BY MOUTH AS NEEDED FOR ERECTILE DYSFUNCTION 18 tablet 0   No current facility-administered medications for this visit.    PHYSICAL EXAM: Vitals:   09/09/23 1109  BP: 116/70  Pulse: 95  Resp: 20  SpO2: 97%  Weight: 206 lb 9.6 oz (93.7 kg)  Height: 5' 11.6" (1.819 m)   Body mass index is 28.33 kg/m.  Wt Readings from Last 3 Encounters:  09/09/23 206 lb 9.6 oz (93.7 kg)  06/10/23 215 lb 4 oz (97.6 kg)  04/23/23 208 lb 9.6 oz (94.6 kg)    General: Well developed, well nourished male in no apparent distress.  HEENT: AT/[place], no external lesions.  Eyes: Conjunctiva clear and no icterus. Neck: Neck supple  Lungs: Respirations not labored Neurologic: Alert, oriented, normal speech Extremities / Skin: Dry.   Psychiatric: Does not appear depressed or anxious  Diabetic Foot Exam - Simple   No data filed    LABS Reviewed Lab Results   Component Value Date   HGBA1C 6.3 (H) 08/19/2023   HGBA1C 6.2 04/15/2023   HGBA1C 6.3 11/27/2022   Lab Results  Component Value Date   FRUCTOSAMINE 295 (H) 11/19/2016   Lab Results  Component Value Date   CHOL 144 06/10/2023   HDL 45.70 06/10/2023   LDLCALC 68 06/10/2023  LDLDIRECT 87.0 09/19/2020   TRIG 151.0 (H) 06/10/2023   CHOLHDL 3 06/10/2023   Lab Results  Component Value Date   MICRALBCREAT 2.3 06/10/2023   MICRALBCREAT 1.7 07/10/2022   Lab Results  Component Value Date   CREATININE 0.88 06/10/2023   Lab Results  Component Value Date   GFR 91.59 06/10/2023    ASSESSMENT / PLAN  1. Controlled type 2 diabetes mellitus without complication, without long-term current use of insulin (HCC)      Diabetes Mellitus type 2, complicated by no known complications. - Diabetic status / severity: Controlled  Lab Results  Component Value Date   HGBA1C 6.3 (H) 08/19/2023    - Hemoglobin A1c goal : <7%  - Medications: no change.  I) continue Mounjaro 2.5 mg weekly. II) continue metformin 1000 mg 2 times a day. III) continue Jardiance 25 mg daily. IV) continue Amaryl 1 mg daily.  - Home glucose testing: In the morning fasting and at bedtime. - Discussed/ Gave Hypoglycemia treatment plan.  # Consult : not required at this time.   # Annual urine for microalbuminuria/ creatinine ratio, no microalbuminuria currently, continue ACE/ARB /lisinopril.  Last  Lab Results  Component Value Date   MICRALBCREAT 2.3 06/10/2023    # Foot check nightly.  # Annual dilated diabetic eye exams.   - Diet: Make healthy diabetic food choices - Life style / activity / exercise: Discussed.  2. Blood pressure  -  BP Readings from Last 1 Encounters:  09/09/23 116/70    - Control is in target.  - No change in current plans.  3. Lipid status / Hyperlipidemia - Last  Lab Results  Component Value Date   LDLCALC 68 06/10/2023   - Continue atorvastatin 20 mg daily and  fenofibrate 160 mg daily.  Diagnoses and all orders for this visit:  Controlled type 2 diabetes mellitus without complication, without long-term current use of insulin (HCC)   DISPOSITION Follow up in clinic in  4 months suggested.   All questions answered and patient verbalized understanding of the plan.  Kevin Alonni Heimsoth, MD [facility] Endocrinology [facility] [address] [place], [address] [place], [place] [address] Phone # [phone]  At least part of this note was generated using voice recognition software. Inadvertent word errors may have occurred, which were not recognized during the proofreading process.

## 2023-09-27 ENCOUNTER — Other Ambulatory Visit: Payer: Self-pay | Admitting: Endocrinology

## 2023-09-27 DIAGNOSIS — E119 Type 2 diabetes mellitus without complications: Secondary | ICD-10-CM

## 2023-10-21 ENCOUNTER — Other Ambulatory Visit: Payer: Self-pay | Admitting: Family Medicine

## 2023-10-21 DIAGNOSIS — E119 Type 2 diabetes mellitus without complications: Secondary | ICD-10-CM

## 2023-10-21 DIAGNOSIS — I10 Essential (primary) hypertension: Secondary | ICD-10-CM

## 2023-11-03 ENCOUNTER — Other Ambulatory Visit: Payer: Self-pay | Admitting: Family Medicine

## 2023-11-08 ENCOUNTER — Other Ambulatory Visit: Payer: Self-pay | Admitting: Family Medicine

## 2023-11-27 ENCOUNTER — Other Ambulatory Visit: Payer: Self-pay | Admitting: Endocrinology

## 2023-11-27 DIAGNOSIS — E1165 Type 2 diabetes mellitus with hyperglycemia: Secondary | ICD-10-CM

## 2023-12-09 ENCOUNTER — Encounter: Payer: Self-pay | Admitting: Family Medicine

## 2023-12-09 ENCOUNTER — Ambulatory Visit: Payer: 59 | Admitting: Family Medicine

## 2023-12-09 VITALS — BP 130/62 | HR 70 | Temp 98.5°F | Ht 71.5 in | Wt 210.0 lb

## 2023-12-09 DIAGNOSIS — E785 Hyperlipidemia, unspecified: Secondary | ICD-10-CM | POA: Diagnosis not present

## 2023-12-09 DIAGNOSIS — S90851A Superficial foreign body, right foot, initial encounter: Secondary | ICD-10-CM

## 2023-12-09 DIAGNOSIS — E1169 Type 2 diabetes mellitus with other specified complication: Secondary | ICD-10-CM

## 2023-12-09 DIAGNOSIS — E119 Type 2 diabetes mellitus without complications: Secondary | ICD-10-CM

## 2023-12-09 DIAGNOSIS — I10 Essential (primary) hypertension: Secondary | ICD-10-CM | POA: Diagnosis not present

## 2023-12-09 LAB — BASIC METABOLIC PANEL WITH GFR
BUN: 15 mg/dL (ref 6–23)
CO2: 30 meq/L (ref 19–32)
Calcium: 10.3 mg/dL (ref 8.4–10.5)
Chloride: 95 meq/L — ABNORMAL LOW (ref 96–112)
Creatinine, Ser: 0.88 mg/dL (ref 0.40–1.50)
GFR: 91.27 mL/min (ref >60.00)
Glucose, Bld: 109 mg/dL — ABNORMAL HIGH (ref 70–99)
Potassium: 4.5 meq/L (ref 3.5–5.1)
Sodium: 133 meq/L — ABNORMAL LOW (ref 135–145)

## 2023-12-09 LAB — HEPATIC FUNCTION PANEL
ALT: 27 U/L (ref 0–53)
AST: 28 U/L (ref 0–37)
Albumin: 4.8 g/dL (ref 3.5–5.2)
Alkaline Phosphatase: 42 U/L (ref 39–117)
Bilirubin, Direct: 0.1 mg/dL (ref 0.0–0.3)
Total Bilirubin: 0.7 mg/dL (ref 0.2–1.2)
Total Protein: 7.9 g/dL (ref 6.0–8.3)

## 2023-12-09 LAB — CBC WITH DIFFERENTIAL/PLATELET
Basophils Absolute: 0.1 10*3/uL (ref 0.0–0.1)
Basophils Relative: 0.8 % (ref 0.0–3.0)
Eosinophils Absolute: 0.3 10*3/uL (ref 0.0–0.7)
Eosinophils Relative: 3.5 % (ref 0.0–5.0)
HCT: 44.7 % (ref 39.0–52.0)
Hemoglobin: 15.4 g/dL (ref 13.0–17.0)
Lymphocytes Relative: 34.8 % (ref 12.0–46.0)
Lymphs Abs: 2.5 10*3/uL (ref 0.7–4.0)
MCHC: 34.6 g/dL (ref 30.0–36.0)
MCV: 92.2 fl (ref 78.0–100.0)
Monocytes Absolute: 0.6 10*3/uL (ref 0.1–1.0)
Monocytes Relative: 8.2 % (ref 3.0–12.0)
Neutro Abs: 3.8 10*3/uL (ref 1.4–7.7)
Neutrophils Relative %: 52.7 % (ref 43.0–77.0)
Platelets: 376 10*3/uL (ref 150.0–400.0)
RBC: 4.84 Mil/uL (ref 4.22–5.81)
RDW: 12.6 % (ref 11.5–15.5)
WBC: 7.1 10*3/uL (ref 4.0–10.5)

## 2023-12-09 LAB — LIPID PANEL
Cholesterol: 145 mg/dL (ref 0–200)
HDL: 52.8 mg/dL (ref >39.00)
LDL Cholesterol: 64 mg/dL (ref 0–99)
NonHDL: 92.62
Total CHOL/HDL Ratio: 3
Triglycerides: 144 mg/dL (ref 0.0–149.0)
VLDL: 28.8 mg/dL (ref 0.0–40.0)

## 2023-12-09 LAB — HEMOGLOBIN A1C: Hgb A1c MFr Bld: 6.3 % (ref 4.6–6.5)

## 2023-12-09 NOTE — Progress Notes (Unsigned)
   Subjective:    Patient ID: Kevin Barnes, male    DOB: 07-21-1961, 63 y.o.   MRN: [id number]  HPI HTN- chronic problem, on Lisinopril  hydrochlorothiazide  20/25mg  daily w/ good control.  No CP, SOB, HA's, visual changes, edema.  Hyperlipidemia- chronic problem, on Lipitor 20mg  daily and Fenofibrate  160mg  daily.  No abd pain, N/V.  DM- chronic problem, on Jardiance  25mg  daily, Glimepiride  1mg  daily, Mounjaro  1.25mg  weekly.  Sees Endo.  Due for foot exam.  Needs to schedule eye exam.  UTD on microalbumin.  Denies symptomatic lows.  No numbness/tingling of hands/feet.  Splinter- R heel.  Thinks this is part of shredded chew toy from the dogs.  Pt reports area was tender for ~1 week and now tenderness has improved.   Review of Systems For ROS see HPI     Objective:   Physical Exam Vitals reviewed.  Constitutional:      General: He is not in acute distress.    Appearance: Normal appearance. He is well-developed. He is not ill-appearing.  HENT:     Head: Normocephalic and atraumatic.  Eyes:     Extraocular Movements: Extraocular movements intact.     Conjunctiva/sclera: Conjunctivae normal.     Pupils: Pupils are equal, round, and reactive to light.  Neck:     Thyroid : No thyromegaly.  Cardiovascular:     Rate and Rhythm: Normal rate and regular rhythm.     Pulses: Normal pulses.     Heart sounds: Normal heart sounds. No murmur heard. Pulmonary:     Effort: Pulmonary effort is normal. No respiratory distress.     Breath sounds: Normal breath sounds.  Abdominal:     General: Bowel sounds are normal. There is no distension.     Palpations: Abdomen is soft.  Musculoskeletal:     Cervical back: Normal range of motion and neck supple.     Right lower leg: No edema.     Left lower leg: No edema.  Lymphadenopathy:     Cervical: No cervical adenopathy.  Skin:    General: Skin is warm and dry.     Findings: Lesion (small, firm foreign body in R plantar surface of heel- removed w/  forceps.  pt tolerated w/o difficulty) present.  Neurological:     General: No focal deficit present.     Mental Status: He is alert and oriented to person, place, and time.     Cranial Nerves: No cranial nerve deficit.  Psychiatric:        Mood and Affect: Mood normal.        Behavior: Behavior normal.           Assessment & Plan:   Foreign body R foot- new.  Pt w/ small hard plastic piece in R heel.  This was removed w/ gentle scraping using forceps.  Pt reports tenderness improved immediately.  UTD on Tdap.  No further work up needed.

## 2023-12-09 NOTE — Patient Instructions (Addendum)
 Schedule your complete physical in 6 months We'll notify you of your lab results and make any changes if needed Continue to work on healthy diet and regular exercise- you can do it! Schedule your eye exam and have them send me a copy of the report Call with any questions or concerns Stay Safe!  Stay Healthy! Have a great summer!!!

## 2023-12-10 ENCOUNTER — Ambulatory Visit: Payer: Self-pay | Admitting: Family Medicine

## 2023-12-10 LAB — TSH: TSH: 1.01 u[IU]/mL (ref 0.35–5.50)

## 2023-12-17 NOTE — Assessment & Plan Note (Signed)
Chronic problem.  On Lipitor 20mg daily and Fenofibrate 160mg daily w/o difficulty.  Check labs.  Adjust meds prn  ?

## 2023-12-17 NOTE — Assessment & Plan Note (Signed)
 Chronic problem.  Following w/ Endo.  Foot exam done today.  Pt to schedule eye exam.  Currently asymptomatic.  Will continue to follow along.

## 2023-12-17 NOTE — Assessment & Plan Note (Signed)
 Chronic problem.  Currently well controlled on Lisinopril  hydrochlorothiazide  20/25mg  daily.  Asymptomatic.  Check labs due to ACE and diuretic use but no anticipated med changes.

## 2024-01-07 ENCOUNTER — Encounter: Payer: Self-pay | Admitting: Endocrinology

## 2024-01-07 ENCOUNTER — Ambulatory Visit: Payer: 59 | Admitting: Endocrinology

## 2024-01-07 VITALS — BP 112/64 | HR 73 | Resp 20 | Ht 71.5 in | Wt 209.4 lb

## 2024-01-07 DIAGNOSIS — E119 Type 2 diabetes mellitus without complications: Secondary | ICD-10-CM

## 2024-01-07 DIAGNOSIS — E669 Obesity, unspecified: Secondary | ICD-10-CM | POA: Diagnosis not present

## 2024-01-07 DIAGNOSIS — Z7984 Long term (current) use of oral hypoglycemic drugs: Secondary | ICD-10-CM | POA: Diagnosis not present

## 2024-01-07 DIAGNOSIS — Z7985 Long-term (current) use of injectable non-insulin antidiabetic drugs: Secondary | ICD-10-CM

## 2024-01-07 DIAGNOSIS — E1169 Type 2 diabetes mellitus with other specified complication: Secondary | ICD-10-CM | POA: Diagnosis not present

## 2024-01-07 MED ORDER — GLIMEPIRIDE 1 MG PO TABS: ORAL_TABLET | ORAL | 3 refills | Status: AC

## 2024-01-07 MED ORDER — EMPAGLIFLOZIN 25 MG PO TABS: 25.0000 mg | ORAL_TABLET | Freq: Every day | ORAL | 3 refills | Status: AC

## 2024-01-07 MED ORDER — MOUNJARO 2.5 MG/0.5ML ~~LOC~~ SOAJ: SUBCUTANEOUS | 4 refills | Status: AC

## 2024-01-07 NOTE — Progress Notes (Signed)
 Outpatient Endocrinology Note Kevin Joselynn Amoroso, MD  01/07/24  Patient's Name: Kevin Barnes    DOB: 06-Jan-1961    MRN: [id number]                                                    REASON OF VISIT: Follow up for type 2 diabetes mellitus  PCP: Jess Morita, MD  HISTORY OF PRESENT ILLNESS:   Kevin Barnes is a 63 y.o. old male with past medical history listed below, is here for follow up of type 2 diabetes mellitus.   Pertinent Diabetes History: Patient was diagnosed with type 2 diabetes mellitus in 2013.  He was initially treated with metformin  starting in July 2014.  He has controlled type 2 diabetes mellitus.  Chronic Diabetes Complications : Retinopathy: no. Last ophthalmology exam was done on annual, in January. Due Nephropathy: no, on lisinopril . Peripheral neuropathy: no Coronary artery disease: no Stroke: no  Relevant comorbidities and cardiovascular risk factors: Obesity: no Body mass index is 28.8 kg/m.  Hypertension: yes Hyperlipidemia. Yes, on a statin and fenofibrate .  Current / Home Diabetic regimen includes: Mounjaro  2.5 mg weekly. Jardiance  25 mg daily. Metformin  1000 mg 2 times a day. Amaryl  1 mg at suppertime.  Prior diabetic medications: Januvia , Onglyza, glipizide . Had muscle cramp /abdominal cramp and pain on higher dose of Mounjaro  5 mg weekly.  Glycemic data:   Glucometer One Touch Verio flex downloaded, he has checked once in last 2 weeks 107.  He has been checking blood sugar occasionally.  Hypoglycemia: Patient has no hypoglycemic episodes. Patient has hypoglycemia awareness.  Factors modifying glucose control: 1.  Diabetic diet assessment: 3 meals a day.  Tries to limit drinks with sugar.  2.  Staying active or exercising:   3.  Medication compliance: compliant all of the time.  Interval history  Diabetes regimen as reviewed and noted above.  Hemoglobin A1c last month 6.3%.  Laboratory results from May reviewed stable renal  function.  Acceptable cholesterol levels.  Denies numbness and tingling of the feet.  No vision problem.  He has been tolerating Mounjaro  2.5 mg weekly, denying GI issues.  He had GI upset with 5 mg dose and wants to stay on the 2.5 mg dose.  No other complaints today.   REVIEW OF SYSTEMS As per history of present illness.   PAST MEDICAL HISTORY: Past Medical History:  Diagnosis Date   Allergy    Basal cell carcinoma of cheek 2016   Diabetes mellitus    Hyperlipidemia    Hypertension    Obesity    Obesity     PAST SURGICAL HISTORY: Past Surgical History:  Procedure Laterality Date   PROSTATE SURGERY      ALLERGIES: No Known Allergies  FAMILY HISTORY:  Family History  Problem Relation Age of Onset   Cancer Father        Prostate   Heart disease Father    Hyperlipidemia Father    Hypertension Father    Heart failure Mother    Stroke Paternal Grandfather    Diabetes Neg Hx     SOCIAL HISTORY: Social History   Socioeconomic History   Marital status: Married    Spouse name: Not on file   Number of children: Not on file   Years of education: 12   Highest education level:  Not on file  Occupational History   Occupation: dispatcher    Employer: [employer]  Tobacco Use   Smoking status: Former   Smokeless tobacco: Never  Vaping Use   Vaping status: Never Used  Substance and Sexual Activity   Alcohol use: Yes    Alcohol/week: 1.0 standard drink of alcohol    Types: 1 Standard drinks or equivalent per week    Comment: 1 drink   Drug use: No   Sexual activity: Yes    Birth control/protection: None  Other Topics Concern   Not on file  Social History Narrative   Not on file   Social Drivers of Health   Financial Resource Strain: Not on file  Food Insecurity: Not on file  Transportation Needs: Not on file  Physical Activity: Not on file  Stress: Not on file  Social Connections: Not on file    MEDICATIONS:  Current Outpatient Medications  Medication  Sig Dispense Refill   atorvastatin  (LIPITOR) 20 MG tablet Take 1 tablet by mouth once daily 90 tablet 0   cholecalciferol (VITAMIN D) 1000 units tablet Take 2,000 Units by mouth daily.     Cyanocobalamin (B-12) 5000 MCG SUBL Place under the tongue.     fenofibrate  160 MG tablet Take 1 tablet by mouth once daily 90 tablet 0   glucose blood (ONETOUCH VERIO) test strip USE 1 STRIP TO CHECK GLUCOSE 1 TO 2 TIMES DAILY 100 each 3   lisinopril -hydrochlorothiazide  (ZESTORETIC ) 20-25 MG tablet Take 1 tablet by mouth once daily 90 tablet 0   meloxicam  (MOBIC ) 7.5 MG tablet Take 1 tablet (7.5 mg total) by mouth daily. 90 tablet 1   metFORMIN  (GLUCOPHAGE ) 1000 MG tablet TAKE 1 TABLET BY MOUTH TWICE DAILY WITH MEALS 180 tablet 3   Omega-3 Fatty Acids (SALMON OIL PO) Take by mouth as directed.     Omega-3 Krill Oil 1000 MG CAPS Take by mouth.     OVER THE COUNTER MEDICATION Sunny Mood and Curamin     sildenafil  (VIAGRA ) 50 MG tablet TAKE 2 TABLETS BY MOUTH AS NEEDED FOR ERECTILE DYSFUNCTION 18 tablet 0   empagliflozin  (JARDIANCE ) 25 MG TABS tablet Take 1 tablet (25 mg total) by mouth daily. 90 tablet 3   glimepiride  (AMARYL ) 1 MG tablet TAKE 1 TABLET BY MOUTH ONCE DAILY BEFORE  SUPPER 90 tablet 3   tirzepatide  (MOUNJARO ) 2.5 MG/0.5ML Pen INJECT 1/2 (ONE-HALF) ML SUBCUTANEOUSLY  ONCE A WEEK 6 mL 4   No current facility-administered medications for this visit.    PHYSICAL EXAM: Vitals:   01/07/24 0806  BP: 112/64  Pulse: 73  Resp: 20  SpO2: 97%  Weight: 209 lb 6.4 oz (95 kg)  Height: 5' 11.5 (1.816 m)   Body mass index is 28.8 kg/m.  Wt Readings from Last 3 Encounters:  01/07/24 209 lb 6.4 oz (95 kg)  12/09/23 210 lb (95.3 kg)  09/09/23 206 lb 9.6 oz (93.7 kg)    General: Well developed, well nourished male in no apparent distress.  HEENT: AT/[place], no external lesions.  Eyes: Conjunctiva clear and no icterus. Neck: Neck supple  Lungs: Respirations not labored Neurologic: Alert, oriented,  normal speech Extremities / Skin: Dry.   Psychiatric: Does not appear depressed or anxious  Diabetic Foot Exam - Simple   No data filed    LABS Reviewed Lab Results  Component Value Date   HGBA1C 6.3 12/09/2023   HGBA1C 6.3 (H) 08/19/2023   HGBA1C 6.2 04/15/2023   Lab Results  Component Value Date   FRUCTOSAMINE 295 (H) 11/19/2016   Lab Results  Component Value Date   CHOL 145 12/09/2023   HDL 52.80 12/09/2023   LDLCALC 64 12/09/2023   LDLDIRECT 87.0 09/19/2020   TRIG 144.0 12/09/2023   CHOLHDL 3 12/09/2023   Lab Results  Component Value Date   MICRALBCREAT 2.3 06/10/2023   MICRALBCREAT 1.7 07/10/2022   Lab Results  Component Value Date   CREATININE 0.88 12/09/2023   Lab Results  Component Value Date   GFR 91.27 12/09/2023    ASSESSMENT / PLAN  1. Controlled type 2 diabetes mellitus without complication, without long-term current use of insulin (HCC)   2. Type 2 diabetes mellitus without complication, without long-term current use of insulin (HCC)   3. Type 2 diabetes mellitus with obesity (HCC)     Diabetes Mellitus type 2, complicated by no known complications. - Diabetic status / severity: Controlled  Lab Results  Component Value Date   HGBA1C 6.3 12/09/2023    - Hemoglobin A1c goal : <6.5%  Discussed about considering gradually increasing Mounjaro .  Due to his past experience with GI upset with higher dose of Mounjaro  does not want to increase the dose.  - Medications: no change.  I) continue Mounjaro  2.5 mg weekly. II) continue metformin  1000 mg 2 times a day. III) continue Jardiance  25 mg daily. IV) continue Amaryl  1 mg daily.  - Home glucose testing: Advised to check blood sugars at least few times a week at different times of the day. - Discussed/ Gave Hypoglycemia treatment plan.  # Consult : not required at this time.   # Annual urine for microalbuminuria/ creatinine ratio, no microalbuminuria currently, continue ACE/ARB /lisinopril   and Jardiance /SGLT2 inhibitor.  Last  Lab Results  Component Value Date   MICRALBCREAT 2.3 06/10/2023    # Foot check nightly.  # Annual dilated diabetic eye exams.  Advised for diabetic eye exam.  - Diet: Make healthy diabetic food choices - Life style / activity / exercise: Discussed.  2. Blood pressure  -  BP Readings from Last 1 Encounters:  01/07/24 112/64    - Control is in target.  - No change in current plans.  3. Lipid status / Hyperlipidemia - Last  Lab Results  Component Value Date   LDLCALC 64 12/09/2023   - Continue atorvastatin  20 mg daily and fenofibrate  160 mg daily.  Managed by primary care provider.  Diagnoses and all orders for this visit:  Controlled type 2 diabetes mellitus without complication, without long-term current use of insulin (HCC) -     Basic metabolic panel with GFR -     Hemoglobin A1c -     Microalbumin / creatinine urine ratio -     glimepiride  (AMARYL ) 1 MG tablet; TAKE 1 TABLET BY MOUTH ONCE DAILY BEFORE  SUPPER  Type 2 diabetes mellitus without complication, without long-term current use of insulin (HCC) -     tirzepatide  (MOUNJARO ) 2.5 MG/0.5ML Pen; INJECT 1/2 (ONE-HALF) ML SUBCUTANEOUSLY  ONCE A WEEK -     empagliflozin  (JARDIANCE ) 25 MG TABS tablet; Take 1 tablet (25 mg total) by mouth daily.  Type 2 diabetes mellitus with obesity (HCC) -     empagliflozin  (JARDIANCE ) 25 MG TABS tablet; Take 1 tablet (25 mg total) by mouth daily.    DISPOSITION Follow up in clinic in  5 months suggested.   All questions answered and patient verbalized understanding of the plan.  Kevin Myangel Summons, MD [facility] Endocrinology [facility]  [facility] [address] [place], [place] [address] Phone # [phone]  At least part of this note was generated using voice recognition software. Inadvertent word errors may have occurred, which were not recognized during the proofreading process.

## 2024-01-16 ENCOUNTER — Other Ambulatory Visit: Payer: Self-pay | Admitting: Family Medicine

## 2024-01-16 DIAGNOSIS — E119 Type 2 diabetes mellitus without complications: Secondary | ICD-10-CM

## 2024-01-16 DIAGNOSIS — I10 Essential (primary) hypertension: Secondary | ICD-10-CM

## 2024-02-03 ENCOUNTER — Other Ambulatory Visit: Payer: Self-pay | Admitting: Family Medicine

## 2024-02-05 ENCOUNTER — Other Ambulatory Visit: Payer: Self-pay | Admitting: Family Medicine

## 2024-02-05 DIAGNOSIS — N529 Male erectile dysfunction, unspecified: Secondary | ICD-10-CM

## 2024-04-13 ENCOUNTER — Other Ambulatory Visit: Payer: Self-pay | Admitting: Family Medicine

## 2024-04-13 DIAGNOSIS — E119 Type 2 diabetes mellitus without complications: Secondary | ICD-10-CM

## 2024-04-13 DIAGNOSIS — I10 Essential (primary) hypertension: Secondary | ICD-10-CM

## 2024-04-17 ENCOUNTER — Other Ambulatory Visit: Payer: Self-pay | Admitting: Family Medicine

## 2024-04-17 NOTE — Telephone Encounter (Signed)
 Patient is requesting a refill on Meloxicam . Medication was last filled a year ago. Okay to refill?

## 2024-05-11 ENCOUNTER — Other Ambulatory Visit: Payer: Self-pay | Admitting: Family Medicine

## 2024-06-01 ENCOUNTER — Other Ambulatory Visit

## 2024-06-02 ENCOUNTER — Ambulatory Visit: Admitting: Endocrinology

## 2024-06-02 ENCOUNTER — Encounter: Payer: Self-pay | Admitting: Endocrinology

## 2024-06-02 ENCOUNTER — Ambulatory Visit: Payer: Self-pay | Admitting: Endocrinology

## 2024-06-02 VITALS — BP 132/72 | HR 72 | Resp 16 | Ht 71.5 in | Wt 212.4 lb

## 2024-06-02 DIAGNOSIS — E119 Type 2 diabetes mellitus without complications: Secondary | ICD-10-CM

## 2024-06-02 DIAGNOSIS — Z7985 Long-term (current) use of injectable non-insulin antidiabetic drugs: Secondary | ICD-10-CM

## 2024-06-02 DIAGNOSIS — Z7984 Long term (current) use of oral hypoglycemic drugs: Secondary | ICD-10-CM

## 2024-06-02 LAB — HEMOGLOBIN A1C
Hgb A1c MFr Bld: 6 % — ABNORMAL HIGH (ref <5.7)
Mean Plasma Glucose: 126 mg/dL
eAG (mmol/L): 7 mmol/L

## 2024-06-02 LAB — BASIC METABOLIC PANEL WITH GFR
BUN: 15 mg/dL (ref 7–25)
CO2: 30 mmol/L (ref 20–32)
Calcium: 10.4 mg/dL — ABNORMAL HIGH (ref 8.6–10.3)
Chloride: 100 mmol/L (ref 98–110)
Creat: 0.79 mg/dL (ref 0.70–1.35)
Glucose, Bld: 110 mg/dL — ABNORMAL HIGH (ref 65–99)
Potassium: 4.8 mmol/L (ref 3.5–5.3)
Sodium: 138 mmol/L (ref 135–146)
eGFR: 99 mL/min/1.73m2 (ref >=60)

## 2024-06-02 LAB — MICROALBUMIN / CREATININE URINE RATIO
Creatinine, Urine: 67 mg/dL (ref 20–320)
Microalb Creat Ratio: 10 mg/g{creat} (ref <30)
Microalb, Ur: 0.7 mg/dL

## 2024-06-02 NOTE — Progress Notes (Signed)
 Outpatient Endocrinology Note Kevin Jump, MD  06/02/24  Patient's Name: Kevin Barnes    DOB: 1961-07-04    MRN: [id number]                                                    REASON OF VISIT: Follow up for type 2 diabetes mellitus  PCP: Mahlon Comer BRAVO, MD  HISTORY OF PRESENT ILLNESS:   Kevin Barnes is a 63 y.o. old male with past medical history listed below, is here for follow up of type 2 diabetes mellitus.   Pertinent Diabetes History: Patient was diagnosed with type 2 diabetes mellitus in 2013.  He was initially treated with metformin  starting in July 2014.  He has controlled type 2 diabetes mellitus.  Chronic Diabetes Complications : Retinopathy: no. Last ophthalmology exam was done on annual, in January. Due Nephropathy: no, on lisinopril . Peripheral neuropathy: no Coronary artery disease: no Stroke: no  Relevant comorbidities and cardiovascular risk factors: Obesity: no Body mass index is 29.21 kg/m.  Hypertension: yes Hyperlipidemia. Yes, on a statin and fenofibrate .  Current / Home Diabetic regimen includes: Mounjaro  2.5 mg weekly. Jardiance  25 mg daily. Metformin  1000 mg 2 times a day. Amaryl  1 mg at suppertime.  Prior diabetic medications: Januvia , Onglyza, glipizide . Had muscle cramp / abdominal cramp and pain on higher dose of Mounjaro  5 mg weekly.  Glycemic data:   Glucometer One Touch Verio flex downloaded, October 28 to June 02, 2024, average blood sugar 111.  He has checked blood sugar in the last 2 days 108, 120, 105, 112.  Hypoglycemia: Patient has no hypoglycemic episodes. Patient has hypoglycemia awareness.  Factors modifying glucose control: 1.  Diabetic diet assessment: 3 meals a day.  Tries to limit drinks with sugar.  2.  Staying active or exercising:   3.  Medication compliance: compliant all of the time.  Interval history  Recent laboratory results reviewed, hemoglobin A1c 6%.  Normal renal function and electrolytes,  serum calcium  corrected is normal.  Urine microalbumin creatinine ratio normal.  Diabetes regimen as reviewed and noted above.  He has been tolerating Mounjaro  well on the lower dose.  No GI issues.  No other complaints today.   REVIEW OF SYSTEMS As per history of present illness.   PAST MEDICAL HISTORY: Past Medical History:  Diagnosis Date   Allergy    Basal cell carcinoma of cheek 2016   Diabetes mellitus    Hyperlipidemia    Hypertension    Obesity    Obesity     PAST SURGICAL HISTORY: Past Surgical History:  Procedure Laterality Date   PROSTATE SURGERY      ALLERGIES: No Known Allergies  FAMILY HISTORY:  Family History  Problem Relation Age of Onset   Cancer Father        Prostate   Heart disease Father    Hyperlipidemia Father    Hypertension Father    Heart failure Mother    Stroke Paternal Grandfather    Diabetes Neg Hx     SOCIAL HISTORY: Social History   Socioeconomic History   Marital status: Married    Spouse name: Not on file   Number of children: Not on file   Years of education: 12   Highest education level: Not on file  Occupational History   Occupation: dispatcher  Employer: [employer]  Tobacco Use   Smoking status: Former   Smokeless tobacco: Never  Vaping Use   Vaping status: Never Used  Substance and Sexual Activity   Alcohol use: Yes    Alcohol/week: 1.0 standard drink of alcohol    Types: 1 Standard drinks or equivalent per week    Comment: 1 drink   Drug use: No   Sexual activity: Yes    Birth control/protection: None  Other Topics Concern   Not on file  Social History Narrative   Not on file   Social Drivers of Health   Financial Resource Strain: Not on file  Food Insecurity: Not on file  Transportation Needs: Not on file  Physical Activity: Not on file  Stress: Not on file  Social Connections: Not on file    MEDICATIONS:  Current Outpatient Medications  Medication Sig Dispense Refill   atorvastatin   (LIPITOR) 20 MG tablet Take 1 tablet by mouth once daily 90 tablet 0   cholecalciferol (VITAMIN D) 1000 units tablet Take 2,000 Units by mouth daily.     Cyanocobalamin (B-12) 5000 MCG SUBL Place under the tongue.     empagliflozin  (JARDIANCE ) 25 MG TABS tablet Take 1 tablet (25 mg total) by mouth daily. 90 tablet 3   fenofibrate  160 MG tablet Take 1 tablet by mouth once daily 90 tablet 0   glimepiride  (AMARYL ) 1 MG tablet TAKE 1 TABLET BY MOUTH ONCE DAILY BEFORE  SUPPER 90 tablet 3   glucose blood (ONETOUCH VERIO) test strip USE 1 STRIP TO CHECK GLUCOSE 1 TO 2 TIMES DAILY 100 each 3   lisinopril -hydrochlorothiazide  (ZESTORETIC ) 20-25 MG tablet Take 1 tablet by mouth once daily 90 tablet 0   meloxicam  (MOBIC ) 7.5 MG tablet Take 1 tablet by mouth once daily 90 tablet 0   metFORMIN  (GLUCOPHAGE ) 1000 MG tablet TAKE 1 TABLET BY MOUTH TWICE DAILY WITH MEALS 180 tablet 3   Omega-3 Fatty Acids (SALMON OIL PO) Take by mouth as directed.     Omega-3 Krill Oil 1000 MG CAPS Take by mouth.     OVER THE COUNTER MEDICATION Sunny Mood and Curamin     sildenafil  (VIAGRA ) 50 MG tablet TAKE 2 TABLETS BY MOUTH AS NEEDED FOR ERECTILE DYSFUNCTION 18 tablet 0   tirzepatide  (MOUNJARO ) 2.5 MG/0.5ML Pen INJECT 1/2 (ONE-HALF) ML SUBCUTANEOUSLY  ONCE A WEEK 6 mL 4   No current facility-administered medications for this visit.    PHYSICAL EXAM: Vitals:   06/02/24 0820  BP: 132/72  Pulse: 72  Resp: 16  SpO2: 99%  Weight: 212 lb 6.4 oz (96.3 kg)  Height: 5' 11.5 (1.816 m)    Body mass index is 29.21 kg/m.  Wt Readings from Last 3 Encounters:  06/02/24 212 lb 6.4 oz (96.3 kg)  01/07/24 209 lb 6.4 oz (95 kg)  12/09/23 210 lb (95.3 kg)    General: Well developed, well nourished male in no apparent distress.  HEENT: AT/[place], no external lesions.  Eyes: Conjunctiva clear and no icterus. Neck: Neck supple  Lungs: Respirations not labored Neurologic: Alert, oriented, normal speech Extremities / Skin: Dry.    Psychiatric: Does not appear depressed or anxious  Diabetic Foot Exam - Simple   No data filed    LABS Reviewed Lab Results  Component Value Date   HGBA1C 6.0 (H) 06/01/2024   HGBA1C 6.3 12/09/2023   HGBA1C 6.3 (H) 08/19/2023   Lab Results  Component Value Date   FRUCTOSAMINE 295 (H) 11/19/2016   Lab  Results  Component Value Date   CHOL 145 12/09/2023   HDL 52.80 12/09/2023   LDLCALC 64 12/09/2023   LDLDIRECT 87.0 09/19/2020   TRIG 144.0 12/09/2023   CHOLHDL 3 12/09/2023   Lab Results  Component Value Date   MICRALBCREAT 10 06/01/2024   Lab Results  Component Value Date   CREATININE 0.79 06/01/2024   Lab Results  Component Value Date   GFR 91.27 12/09/2023    ASSESSMENT / PLAN  1. Controlled type 2 diabetes mellitus without complication, without long-term current use of insulin (HCC)     Diabetes Mellitus type 2, complicated by no known complications. - Diabetic status / severity: Controlled  Lab Results  Component Value Date   HGBA1C 6.0 (H) 06/01/2024    - Hemoglobin A1c goal : <6.5%  Discussed about considering gradually increasing Mounjaro .  Due to his past experience with GI upset with higher dose of Mounjaro  does not want to increase the dose.  Consider increasing dose of Mounjaro  and taking off other diabetic medication in the future.  - Medications: no change.  I) continue Mounjaro  2.5 mg weekly. II) continue metformin  1000 mg 2 times a day. III) continue Jardiance  25 mg daily. IV) continue Amaryl  1 mg daily.  - Home glucose testing: Advised to check blood sugars at least few times a week at different times of the day. - Discussed/ Gave Hypoglycemia treatment plan.  # Consult : not required at this time.   # Annual urine for microalbuminuria/ creatinine ratio, no microalbuminuria currently, continue ACE/ARB /lisinopril  and Jardiance /SGLT2 inhibitor.  Last  Lab Results  Component Value Date   MICRALBCREAT 10 06/01/2024    # Foot  check nightly.  # Annual dilated diabetic eye exams.  Advised for diabetic eye exam.  - Diet: Make healthy diabetic food choices - Life style / activity / exercise: Discussed.  2. Blood pressure  -  BP Readings from Last 1 Encounters:  06/02/24 132/72    - Control is in target.  - No change in current plans.  3. Lipid status / Hyperlipidemia - Last  Lab Results  Component Value Date   LDLCALC 64 12/09/2023   - Continue atorvastatin  20 mg daily and fenofibrate  160 mg daily.  Managed by primary care provider.  Diagnoses and all orders for this visit:  Controlled type 2 diabetes mellitus without complication, without long-term current use of insulin (HCC) -     Basic metabolic panel with GFR -     Hemoglobin A1c    DISPOSITION Follow up in clinic in  6 months suggested.  Labs prior to follow-up visit as ordered.   All questions answered and patient verbalized understanding of the plan.  Ronon Ferger, MD [facility] Endocrinology [facility] [address] [place], [address] [place], [place] [address] Phone # [phone]  At least part of this note was generated using voice recognition software. Inadvertent word errors may have occurred, which were not recognized during the proofreading process.

## 2024-06-22 ENCOUNTER — Ambulatory Visit (INDEPENDENT_AMBULATORY_CARE_PROVIDER_SITE_OTHER): Admitting: Family Medicine

## 2024-06-22 ENCOUNTER — Encounter: Payer: Self-pay | Admitting: Family Medicine

## 2024-06-22 ENCOUNTER — Other Ambulatory Visit: Payer: Self-pay | Admitting: Family Medicine

## 2024-06-22 VITALS — BP 122/64 | HR 75 | Temp 98.2°F | Ht 70.0 in | Wt 213.4 lb

## 2024-06-22 DIAGNOSIS — Z125 Encounter for screening for malignant neoplasm of prostate: Secondary | ICD-10-CM

## 2024-06-22 DIAGNOSIS — I10 Essential (primary) hypertension: Secondary | ICD-10-CM

## 2024-06-22 DIAGNOSIS — Z0000 Encounter for general adult medical examination without abnormal findings: Secondary | ICD-10-CM | POA: Diagnosis not present

## 2024-06-22 DIAGNOSIS — N529 Male erectile dysfunction, unspecified: Secondary | ICD-10-CM

## 2024-06-22 LAB — PSA: PSA: 3.56 ng/mL (ref 0.10–4.00)

## 2024-06-22 LAB — TSH: TSH: 0.56 u[IU]/mL (ref 0.35–5.50)

## 2024-06-22 MED ORDER — ROSUVASTATIN CALCIUM 10 MG PO TABS: 10.0000 mg | ORAL_TABLET | Freq: Every day | ORAL | 3 refills | Status: AC

## 2024-06-22 NOTE — Assessment & Plan Note (Signed)
 Pt's PE WNL.  UTD on foot exam and microalbumin.  Wants to wait until he's on Medicare for colonoscopy.  Eye exam is scheduled for January.  Declines PNA, shingles, flu.  Check labs.  Anticipatory guidance provided.

## 2024-06-22 NOTE — Progress Notes (Signed)
   Subjective:    Patient ID: Kevin Barnes, male    DOB: Nov 26, 1960, 63 y.o.   MRN: [id number]  HPI CPE- due for eye exam (scheduled), colonoscopy (wants to wait).  Declines PNA, shingles.  UTD on foot exam, microalbumin  Patient Care Team    Relationship Specialty Notifications Start End  Mahlon Comer BRAVO, MD PCP - General Family Medicine  10/19/15   Alline Lenis, MD (Inactive) Consulting Physician Urology  01/29/17   Dermatology, [facility] Physician   03/02/19   Thapa, Sudan, MD Consulting Physician Endocrinology  12/09/23     Health Maintenance  Topic Date Due   Pneumococcal Vaccine: 50+ Years (1 of 2 - PCV) Never done   Zoster Vaccines- Shingrix (1 of 2) Never done   OPHTHALMOLOGY EXAM  10/31/2022   Colonoscopy  01/09/2024   Influenza Vaccine  02/21/2024   COVID-19 Vaccine (1 - 2025-26 season) Never done   HEMOGLOBIN A1C  11/29/2024   FOOT EXAM  12/08/2024   Diabetic kidney evaluation - eGFR measurement  06/01/2025   Diabetic kidney evaluation - Urine ACR  06/01/2025   DTaP/Tdap/Td (2 - Td or Tdap) 01/08/2026   Hepatitis C Screening  Completed   HIV Screening  Completed   Hepatitis B Vaccines 19-59 Average Risk  Aged Out   HPV VACCINES  Aged Out   Meningococcal B Vaccine  Aged Out      Review of Systems Patient reports no vision/hearing changes, anorexia, fever ,adenopathy, persistant/recurrent hoarseness, swallowing issues, chest pain, palpitations, edema, persistant/recurrent cough, hemoptysis, dyspnea (rest,exertional, paroxysmal nocturnal), gastrointestinal  bleeding (melena, rectal bleeding), abdominal pain, excessive heart burn, GU symptoms (dysuria, hematuria, voiding/incontinence issues) syncope, focal weakness, memory loss, numbness & tingling, skin/hair/nail changes, depression, anxiety, abnormal bruising/bleeding.   + muscle aches- pt would like to stop statins if possible.  Concerns about memory.    Objective:   Physical Exam General Appearance:     Alert, cooperative, no distress, appears stated age  Head:    Normocephalic, without obvious abnormality, atraumatic  Eyes:    PERRL, conjunctiva/corneas clear, EOM's intact both eyes       Ears:    Normal TM's and external ear canals, both ears  Nose:   Nares normal, septum midline, mucosa normal, no drainage   or sinus tenderness  Throat:   Lips, mucosa, and tongue normal; teeth and gums normal  Neck:   Supple, symmetrical, trachea midline, no adenopathy;       thyroid :  No enlargement/tenderness/nodules  Back:     Symmetric, no curvature, ROM normal, no CVA tenderness  Lungs:     Clear to auscultation bilaterally, respirations unlabored  Chest wall:    No tenderness or deformity  Heart:    Regular rate and rhythm, S1 and S2 normal, no murmur, rub   or gallop  Abdomen:     Soft, non-tender, bowel sounds active all four quadrants,    no masses, no organomegaly  Genitalia:    deferred  Rectal:    Extremities:   Extremities normal, atraumatic, no cyanosis or edema  Pulses:   2+ and symmetric all extremities  Skin:   Skin color, texture, turgor normal, no rashes or lesions  Lymph nodes:   Cervical, supraclavicular, and axillary nodes normal  Neurologic:   CNII-XII intact. Normal strength, sensation and reflexes      throughout          Assessment & Plan:

## 2024-06-22 NOTE — Patient Instructions (Signed)
 Follow up in 6 months to recheck blood pressure and cholesterol We'll notify you of your lab results and make any changes if needed Keep up the good work on healthy diet and regular exercise- you look great! STOP the Lipitor START the Crestor (Rosuvastatin) daily Call with any questions or concerns Stay Safe!  Stay Healthy! Happy Holidays!!

## 2024-06-23 LAB — LIPID PANEL
Cholesterol: 137 mg/dL (ref 0–200)
HDL: 50.5 mg/dL (ref >39.00)
LDL Cholesterol: 66 mg/dL (ref 0–99)
NonHDL: 86.84
Total CHOL/HDL Ratio: 3
Triglycerides: 103 mg/dL (ref 0.0–149.0)
VLDL: 20.6 mg/dL (ref 0.0–40.0)

## 2024-06-23 LAB — HEPATIC FUNCTION PANEL
ALT: 27 U/L (ref 0–53)
AST: 24 U/L (ref 0–37)
Albumin: 4.7 g/dL (ref 3.5–5.2)
Alkaline Phosphatase: 40 U/L (ref 39–117)
Bilirubin, Direct: 0.1 mg/dL (ref 0.0–0.3)
Total Bilirubin: 0.5 mg/dL (ref 0.2–1.2)
Total Protein: 7.3 g/dL (ref 6.0–8.3)

## 2024-06-23 LAB — BASIC METABOLIC PANEL WITH GFR
BUN: 19 mg/dL (ref 6–23)
CO2: 28 meq/L (ref 19–32)
Calcium: 10.1 mg/dL (ref 8.4–10.5)
Chloride: 99 meq/L (ref 96–112)
Creatinine, Ser: 0.81 mg/dL (ref 0.40–1.50)
GFR: 93.23 mL/min (ref >60.00)
Glucose, Bld: 95 mg/dL (ref 70–99)
Potassium: 4.3 meq/L (ref 3.5–5.1)
Sodium: 138 meq/L (ref 135–145)

## 2024-06-24 ENCOUNTER — Ambulatory Visit: Payer: Self-pay | Admitting: Family Medicine

## 2024-07-27 ENCOUNTER — Other Ambulatory Visit: Payer: Self-pay | Admitting: Family Medicine

## 2024-07-27 DIAGNOSIS — E119 Type 2 diabetes mellitus without complications: Secondary | ICD-10-CM

## 2024-07-27 DIAGNOSIS — I10 Essential (primary) hypertension: Secondary | ICD-10-CM

## 2024-08-03 ENCOUNTER — Other Ambulatory Visit: Payer: Self-pay | Admitting: Family Medicine

## 2024-08-27 ENCOUNTER — Encounter: Payer: Self-pay | Admitting: Family Medicine

## 2024-08-27 NOTE — Telephone Encounter (Signed)
 Please read message below.

## 2024-11-30 ENCOUNTER — Other Ambulatory Visit

## 2024-12-01 ENCOUNTER — Ambulatory Visit: Admitting: Endocrinology

## 2024-12-21 ENCOUNTER — Ambulatory Visit: Admitting: Family Medicine
